# Patient Record
Sex: Female | Born: 1965 | Race: White | Hispanic: No | Smoking: Never smoker
Health system: Southern US, Community
[De-identification: ages and names within clinical notes are randomized; demographics above are authoritative.]

## PROBLEM LIST (undated history)

## (undated) DIAGNOSIS — R42 Dizziness and giddiness: Secondary | ICD-10-CM

## (undated) DIAGNOSIS — H9319 Tinnitus, unspecified ear: Secondary | ICD-10-CM

## (undated) DIAGNOSIS — Z923 Personal history of irradiation: Secondary | ICD-10-CM

## (undated) DIAGNOSIS — C50919 Malignant neoplasm of unspecified site of unspecified female breast: Secondary | ICD-10-CM

## (undated) DIAGNOSIS — F419 Anxiety disorder, unspecified: Secondary | ICD-10-CM

## (undated) DIAGNOSIS — Z9221 Personal history of antineoplastic chemotherapy: Secondary | ICD-10-CM

## (undated) DIAGNOSIS — R112 Nausea with vomiting, unspecified: Secondary | ICD-10-CM

## (undated) DIAGNOSIS — L309 Dermatitis, unspecified: Secondary | ICD-10-CM

## (undated) DIAGNOSIS — Z9889 Other specified postprocedural states: Secondary | ICD-10-CM

## (undated) DIAGNOSIS — J45909 Unspecified asthma, uncomplicated: Secondary | ICD-10-CM

## (undated) DIAGNOSIS — Z87898 Personal history of other specified conditions: Secondary | ICD-10-CM

## (undated) DIAGNOSIS — C50411 Malignant neoplasm of upper-outer quadrant of right female breast: Secondary | ICD-10-CM

## (undated) HISTORY — DX: Tinnitus, unspecified ear: H93.19

## (undated) HISTORY — DX: Anxiety disorder, unspecified: F41.9

## (undated) HISTORY — DX: Dizziness and giddiness: R42

## (undated) HISTORY — DX: Unspecified asthma, uncomplicated: J45.909

## (undated) HISTORY — DX: Malignant neoplasm of upper-outer quadrant of right female breast: C50.411

---
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
## 1998-05-28 ENCOUNTER — Other Ambulatory Visit: Admission: RE | Admit: 1998-05-28 | Discharge: 1998-05-28 | Payer: Self-pay | Admitting: Obstetrics & Gynecology

## 1998-06-06 ENCOUNTER — Encounter: Payer: Self-pay | Admitting: Obstetrics & Gynecology

## 1998-06-06 ENCOUNTER — Ambulatory Visit (HOSPITAL_COMMUNITY): Admission: RE | Admit: 1998-06-06 | Discharge: 1998-06-06 | Payer: Self-pay | Admitting: Obstetrics & Gynecology

## 1999-03-09 ENCOUNTER — Inpatient Hospital Stay (HOSPITAL_COMMUNITY): Admission: AD | Admit: 1999-03-09 | Discharge: 1999-03-11 | Payer: Self-pay | Admitting: Obstetrics & Gynecology

## 1999-03-12 ENCOUNTER — Encounter: Admission: RE | Admit: 1999-03-12 | Discharge: 1999-06-10 | Payer: Self-pay | Admitting: Obstetrics & Gynecology

## 1999-04-28 ENCOUNTER — Encounter: Admission: RE | Admit: 1999-04-28 | Discharge: 1999-04-28 | Payer: Self-pay | Admitting: Sports Medicine

## 1999-07-22 ENCOUNTER — Other Ambulatory Visit: Admission: RE | Admit: 1999-07-22 | Discharge: 1999-07-22 | Payer: Self-pay | Admitting: Obstetrics & Gynecology

## 2000-04-22 ENCOUNTER — Encounter: Admission: RE | Admit: 2000-04-22 | Discharge: 2000-04-22 | Payer: Self-pay | Admitting: Sports Medicine

## 2000-05-03 ENCOUNTER — Encounter: Admission: RE | Admit: 2000-05-03 | Discharge: 2000-05-03 | Payer: Self-pay | Admitting: Sports Medicine

## 2000-05-03 ENCOUNTER — Encounter: Payer: Self-pay | Admitting: Sports Medicine

## 2000-07-21 ENCOUNTER — Other Ambulatory Visit: Admission: RE | Admit: 2000-07-21 | Discharge: 2000-07-21 | Payer: Self-pay | Admitting: Obstetrics & Gynecology

## 2001-06-01 ENCOUNTER — Other Ambulatory Visit: Admission: RE | Admit: 2001-06-01 | Discharge: 2001-06-01 | Payer: Self-pay | Admitting: Obstetrics & Gynecology

## 2002-09-05 ENCOUNTER — Encounter: Admission: RE | Admit: 2002-09-05 | Discharge: 2002-09-05 | Payer: Self-pay | Admitting: Obstetrics and Gynecology

## 2002-11-02 ENCOUNTER — Inpatient Hospital Stay (HOSPITAL_COMMUNITY): Admission: AD | Admit: 2002-11-02 | Discharge: 2002-11-04 | Payer: Self-pay | Admitting: Obstetrics & Gynecology

## 2002-11-02 ENCOUNTER — Encounter: Admission: RE | Admit: 2002-11-02 | Discharge: 2002-11-02 | Payer: Self-pay | Admitting: Sports Medicine

## 2002-12-05 ENCOUNTER — Other Ambulatory Visit: Admission: RE | Admit: 2002-12-05 | Discharge: 2002-12-05 | Payer: Self-pay | Admitting: Obstetrics & Gynecology

## 2004-09-08 ENCOUNTER — Other Ambulatory Visit: Admission: RE | Admit: 2004-09-08 | Discharge: 2004-09-08 | Payer: Self-pay | Admitting: Obstetrics & Gynecology

## 2006-05-27 DIAGNOSIS — J45909 Unspecified asthma, uncomplicated: Secondary | ICD-10-CM | POA: Insufficient documentation

## 2006-05-27 DIAGNOSIS — L2089 Other atopic dermatitis: Secondary | ICD-10-CM | POA: Insufficient documentation

## 2008-07-26 ENCOUNTER — Ambulatory Visit: Payer: Self-pay | Admitting: Sports Medicine

## 2008-07-26 DIAGNOSIS — M25559 Pain in unspecified hip: Secondary | ICD-10-CM | POA: Insufficient documentation

## 2009-10-28 ENCOUNTER — Ambulatory Visit: Payer: Self-pay | Admitting: Surgery

## 2009-10-28 ENCOUNTER — Emergency Department (HOSPITAL_COMMUNITY): Admission: EM | Admit: 2009-10-28 | Discharge: 2009-10-28 | Payer: Self-pay | Admitting: Emergency Medicine

## 2009-10-28 ENCOUNTER — Encounter (INDEPENDENT_AMBULATORY_CARE_PROVIDER_SITE_OTHER): Payer: Self-pay | Admitting: Emergency Medicine

## 2015-02-11 ENCOUNTER — Telehealth: Payer: Self-pay | Admitting: Pediatrics

## 2015-02-11 MED ORDER — MONTELUKAST SODIUM 10 MG PO TABS: 10.0000 mg | ORAL_TABLET | Freq: Every day | ORAL | Status: DC

## 2015-02-11 NOTE — Telephone Encounter (Signed)
Sent in 30 day supply on [facility] to [facility] [address] and called and notify patient of RX sent in. Patient will follow up with Dr Shaune Leeks on 02/25/15.

## 2015-02-11 NOTE — Telephone Encounter (Signed)
Last OV was 01/22/2014. Pt made apt with Dr. Shaune Leeks for 02/25/2015. Wanting singulair Refill at [facility] [address] advise

## 2015-02-25 ENCOUNTER — Ambulatory Visit (INDEPENDENT_AMBULATORY_CARE_PROVIDER_SITE_OTHER): Payer: Commercial Managed Care - PPO | Admitting: Pediatrics

## 2015-02-25 ENCOUNTER — Encounter: Payer: Self-pay | Admitting: Pediatrics

## 2015-02-25 VITALS — BP 132/80 | HR 80 | Resp 16

## 2015-02-25 DIAGNOSIS — J3089 Other allergic rhinitis: Secondary | ICD-10-CM | POA: Insufficient documentation

## 2015-02-25 DIAGNOSIS — J4540 Moderate persistent asthma, uncomplicated: Secondary | ICD-10-CM | POA: Diagnosis not present

## 2015-02-25 DIAGNOSIS — J301 Allergic rhinitis due to pollen: Secondary | ICD-10-CM

## 2015-02-25 NOTE — Progress Notes (Signed)
  [address] [place] [address] Dept: [phone]  FOLLOW UP NOTE  Patient ID: Courtney Gomez, female    DOB: 1965/08/27  Age: 49 y.o. MRN: [id number] Date of Office Visit: 02/25/2015  Assessment Chief Complaint: Asthma  HPI LEORIA PEGLOW presents for follow-up of her asthma and allergic rhinitis. She has had a flu vaccination. Her asthma is well controlled and she rarely needs Pro-air   Current medications are Singulair 10 mg once a day, cetirizine 10 mg once a day, Pro-air 2 puffs every 4 hours if needed, Advair Diskus 250/50 one puff once a day and fluticasone 2 sprays per nostril once a day if needed. Her other medications are outlined in the chart   Drug Allergies:  Allergies  Allergen Reactions  . Latex Itching and Swelling    Physical Exam: BP 132/80 mmHg  Pulse 80  Resp 16   Physical Exam  Constitutional: She is oriented to person, place, and time. She appears well-developed and well-nourished.  HENT:  Eyes normal. Ears normal. Nose normal. Pharynx normal.  Neck: Neck supple. No thyromegaly present.  Cardiovascular:  S1 and S2 normal no murmurs  Pulmonary/Chest:  Clear to percussion and auscultation  Lymphadenopathy:    She has no cervical adenopathy.  Neurological: She is alert and oriented to person, place, and time.  Psychiatric: She has a normal mood and affect. Her behavior is normal. Judgment and thought content normal.  Vitals reviewed.   Diagnostics:  FVC 3.43 L FEV1 2.42 L. Predicted FVC 3.40 L predicted FEV1 2.78 L-This  shows a minimal reduction in the FEV1 percent  Assessment and Plan: 1. Moderate persistent asthma, uncomplicated   2. Allergic rhinitis due to pollen         Patient Instructions  Continue on the treatment plan outlined above Follow-up in one year but she will call me she's not doing well on this treatment plan    Return in about 1 year (around 02/25/2016).    Thank you for the opportunity to care for  this patient.  Please do not hesitate to contact me with questions.  Penne Lash, M.D.  [facility] [address] [place], [place] [address] [phone]

## 2015-02-25 NOTE — Patient Instructions (Signed)
Continue on the treatment plan outlined above Follow-up in one year but she will call me she's not doing well on this treatment plan

## 2015-03-14 ENCOUNTER — Other Ambulatory Visit: Payer: Self-pay | Admitting: Pediatrics

## 2015-03-15 ENCOUNTER — Other Ambulatory Visit: Payer: Self-pay | Admitting: Pediatrics

## 2015-07-05 ENCOUNTER — Other Ambulatory Visit: Payer: Self-pay | Admitting: Obstetrics & Gynecology

## 2015-07-05 DIAGNOSIS — N6001 Solitary cyst of right breast: Secondary | ICD-10-CM

## 2015-07-11 ENCOUNTER — Other Ambulatory Visit: Payer: Self-pay | Admitting: Obstetrics & Gynecology

## 2015-07-11 ENCOUNTER — Ambulatory Visit
Admission: RE | Admit: 2015-07-11 | Discharge: 2015-07-11 | Disposition: A | Discharge status: Home / Self Care | Payer: 59 | Source: Ambulatory Visit | Attending: Obstetrics & Gynecology | Admitting: Obstetrics & Gynecology

## 2015-07-11 DIAGNOSIS — N6310 Unspecified lump in the right breast, unspecified quadrant: Secondary | ICD-10-CM

## 2015-07-11 DIAGNOSIS — N6001 Solitary cyst of right breast: Secondary | ICD-10-CM

## 2015-07-11 HISTORY — PX: BREAST BIOPSY: SHX20

## 2015-07-15 ENCOUNTER — Telehealth: Payer: Self-pay | Admitting: *Deleted

## 2015-07-15 ENCOUNTER — Encounter: Payer: Self-pay | Admitting: *Deleted

## 2015-07-15 DIAGNOSIS — C50411 Malignant neoplasm of upper-outer quadrant of right female breast: Secondary | ICD-10-CM

## 2015-07-15 HISTORY — DX: Malignant neoplasm of upper-outer quadrant of right female breast: C50.411

## 2015-07-15 NOTE — Telephone Encounter (Signed)
Confirmed [facility] for 07/24/15 at 0815.  Instructions and contact information given.

## 2015-07-18 ENCOUNTER — Telehealth: Payer: Self-pay | Admitting: *Deleted

## 2015-07-18 NOTE — Telephone Encounter (Signed)
Pt called questioning site of bx and increased size. Informed pt that often a hematoma will form after a bx. Pt relate she spoke to Dr. Theda Sers and was told the same. No further questions.

## 2015-07-24 ENCOUNTER — Ambulatory Visit
Admission: RE | Admit: 2015-07-24 | Discharge: 2015-07-24 | Disposition: A | Discharge status: Home / Self Care | Payer: 59 | Source: Ambulatory Visit | Attending: Radiation Oncology | Admitting: Radiation Oncology

## 2015-07-24 ENCOUNTER — Other Ambulatory Visit: Payer: Self-pay

## 2015-07-24 ENCOUNTER — Ambulatory Visit: Payer: 59 | Attending: General Surgery | Admitting: Physical Therapy

## 2015-07-24 ENCOUNTER — Encounter: Payer: Self-pay | Admitting: *Deleted

## 2015-07-24 ENCOUNTER — Encounter: Payer: Self-pay | Admitting: Hematology and Oncology

## 2015-07-24 ENCOUNTER — Telehealth: Payer: Self-pay | Admitting: Hematology and Oncology

## 2015-07-24 ENCOUNTER — Encounter: Payer: Self-pay | Admitting: Skilled Nursing Facility1

## 2015-07-24 ENCOUNTER — Other Ambulatory Visit (HOSPITAL_BASED_OUTPATIENT_CLINIC_OR_DEPARTMENT_OTHER): Payer: 59

## 2015-07-24 ENCOUNTER — Ambulatory Visit (HOSPITAL_BASED_OUTPATIENT_CLINIC_OR_DEPARTMENT_OTHER): Payer: 59 | Admitting: Hematology and Oncology

## 2015-07-24 ENCOUNTER — Encounter: Payer: Self-pay | Admitting: Physical Therapy

## 2015-07-24 VITALS — BP 169/88 | HR 99 | Temp 98.1°F | Resp 18 | Ht 66.0 in | Wt 135.0 lb

## 2015-07-24 DIAGNOSIS — C50411 Malignant neoplasm of upper-outer quadrant of right female breast: Secondary | ICD-10-CM

## 2015-07-24 DIAGNOSIS — R293 Abnormal posture: Secondary | ICD-10-CM | POA: Diagnosis present

## 2015-07-24 LAB — CBC WITH DIFFERENTIAL/PLATELET
BASO%: 0.2 % (ref 0.0–2.0)
Basophils Absolute: 0 10*3/uL (ref 0.0–0.1)
EOS%: 2.1 % (ref 0.0–7.0)
Eosinophils Absolute: 0.1 10*3/uL (ref 0.0–0.5)
HCT: 45.8 % (ref 34.8–46.6)
HGB: 14.8 g/dL (ref 11.6–15.9)
LYMPH#: 1.9 10*3/uL (ref 0.9–3.3)
LYMPH%: 29.1 % (ref 14.0–49.7)
MCH: 32.1 pg (ref 25.1–34.0)
MCHC: 32.3 g/dL (ref 31.5–36.0)
MCV: 99.3 fL (ref 79.5–101.0)
MONO#: 0.3 10*3/uL (ref 0.1–0.9)
MONO%: 4.7 % (ref 0.0–14.0)
NEUT%: 63.9 % (ref 38.4–76.8)
NEUTROS ABS: 4.2 10*3/uL (ref 1.5–6.5)
PLATELETS: 230 10*3/uL (ref 145–400)
RBC: 4.61 10*6/uL (ref 3.70–5.45)
RDW: 12.7 % (ref 11.2–14.5)
WBC: 6.5 10*3/uL (ref 3.9–10.3)

## 2015-07-24 LAB — COMPREHENSIVE METABOLIC PANEL
ALT: 25 U/L (ref 0–55)
ANION GAP: 10 meq/L (ref 3–11)
AST: 22 U/L (ref 5–34)
Albumin: 4 g/dL (ref 3.5–5.0)
Alkaline Phosphatase: 70 U/L (ref 40–150)
BILIRUBIN TOTAL: 0.59 mg/dL (ref 0.20–1.20)
BUN: 7.8 mg/dL (ref 7.0–26.0)
CO2: 27 meq/L (ref 22–29)
CREATININE: 0.9 mg/dL (ref 0.6–1.1)
Calcium: 9.9 mg/dL (ref 8.4–10.4)
Chloride: 106 mEq/L (ref 98–109)
EGFR: 79 mL/min/{1.73_m2} — ABNORMAL LOW (ref >90)
GLUCOSE: 97 mg/dL (ref 70–140)
Potassium: 3.8 mEq/L (ref 3.5–5.1)
SODIUM: 143 meq/L (ref 136–145)
TOTAL PROTEIN: 7.5 g/dL (ref 6.4–8.3)

## 2015-07-24 MED ORDER — ONDANSETRON HCL 8 MG PO TABS: 8.0000 mg | ORAL_TABLET | Freq: Two times a day (BID) | ORAL | Status: DC | PRN

## 2015-07-24 MED ORDER — LIDOCAINE-PRILOCAINE 2.5-2.5 % EX CREA: TOPICAL_CREAM | CUTANEOUS | Status: DC

## 2015-07-24 MED ORDER — PROCHLORPERAZINE MALEATE 10 MG PO TABS: 10.0000 mg | ORAL_TABLET | Freq: Four times a day (QID) | ORAL | Status: DC | PRN

## 2015-07-24 MED ORDER — LORAZEPAM 0.5 MG PO TABS: 0.5000 mg | ORAL_TABLET | Freq: Every day | ORAL | Status: DC

## 2015-07-24 MED ORDER — DEXAMETHASONE 4 MG PO TABS: 4.0000 mg | ORAL_TABLET | Freq: Two times a day (BID) | ORAL | Status: DC

## 2015-07-24 NOTE — Assessment & Plan Note (Signed)
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 After reviewing the pathology in detail, we proceeded to discuss the different treatment options between surgery, radiation, chemotherapy, antiestrogen therapies.  Recommendation based on multidisciplinary tumor board: 1. Neoadjuvant chemotherapy with Adriamycin and Cytoxan dose dense 4 followed by Abraxane weekly 12 2. Followed by breast conserving surgery with sentinel lymph node study 3. Followed by adjuvant radiation therapy  Chemotherapy Counseling: I discussed the risks and benefits of chemotherapy including the risks of nausea/ vomiting, risk of infection from low WBC count, fatigue due to chemo or anemia, bruising or bleeding due to low platelets, mouth sores, loss/ change in taste and decreased appetite. Liver and kidney function will be monitored through out chemotherapy as abnormalities in liver and kidney function may be a side effect of treatment. Cardiac dysfunction due to Adriamycin was discussed in detail. Risk of permanent bone marrow dysfunction due to chemo were also discussed.  Plan: 1. Port placement to be done next Monday 2. Echocardiogram 3. Chemotherapy class 4. Breast MRI  Genetic counseling will also be arranged  PREVENT trial: [id number] [phone]  Newly diagnosed breast cancer Stages 1-3 scheduled to receive anthracycline randomized to atorvastatin 40 mg or placebo once daily for 24 months along with 3 cardiac MRIs or 2 years paid for by the trial. I  discussed the risks and benefits of atorvastatin including the risk of myopathy and elevation of liver function tests. I explained the randomization process and patient is interested in the trial. I provided her with literature to read and provided her with the contact with the clinical trials nurse to ask any questions regarding the trial.  Return to clinic in 1 week to start chemotherapy.

## 2015-07-24 NOTE — Progress Notes (Signed)
Subjective:     Patient ID: Courtney Gomez, female   DOB: 1965-11-24, 50 y.o.   MRN: [id number]  HPI   Review of Systems     Objective:   Physical Exam For the patient to understand and be given the tools to implement a healthy plant based diet during their cancer diagnosis.     Assessment:     Patient was seen today and found to be high energy and accompanied by her friend. Pts ht unknown, wt 135 pounds. Pt states she does not have an appetite and can graze on a bag of fruit all day and be fine.      Plan:     Dietitian educated the patient on implementing a plant based diet by incorporating more plant proteins, fruits, and vegetables. As a part of a healthy routine physical activity was discussed. Dietitian educated the pt on the importance of eating enough balanced calories to sustain health. The importance of legitimate, evidence based information was discussed and examples were given. A folder of evidence based information with a focus on a plant based diet and general nutrition during cancer was given to the patient.  As a part of the continuum of care the cancer dietitian's contact information was given to the patient in the event they would like to have a follow up appointment.

## 2015-07-24 NOTE — Progress Notes (Signed)
[facility] CONSULT NOTE  Patient Care Team: Vania Rea, MD as PCP - General (Obstetrics and Gynecology)  CHIEF COMPLAINTS/PURPOSE OF CONSULTATION:  Newly diagnosed breast cancer  HISTORY OF PRESENTING ILLNESS:  Courtney Gomez 50 y.o. female is here because of recent diagnosis of right breast cancer. Patient had a routine screening mammogram last year which was normal. She felt an itching sensation underneath her right axilla and she felt around and found a lump. She then immediately brought her to the attention of her primary care physician who then obtained additional imaging studies with mammograms and ultrasound. 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 She was presented this morning of the multidisciplinary tumor board and she is here today at the [facility] clinic to discuss treatment plan.  I reviewed her records extensively and collaborated the history with the patient.  SUMMARY OF ONCOLOGIC HISTORY:   Breast cancer of upper-outer quadrant of right female breast ([place])   07/11/2015 Mammogram Right breast mass in the axillary tail 1.7 x 1.5 x 1.3 cm axillary ultrasound negative, T1c N0 stage IA clinical stage   07/11/2015 Initial Diagnosis Right breast biopsy 10:00 position: Invasive ductal carcinoma grade 3, ER 0%, P of 0%, Ki-67 95%, HER-2 negative ratio 1.21    In terms of breast cancer risk profile:  She menarched at early age of 7  She had 2 pregnancy, her first child was born at age 61 and [age]  She has not received birth control pills.  She was never exposed to fertility medications or hormone replacement therapy.  She has no family history of Breast/GYN/GI cancer  MEDICAL HISTORY:  Past Medical History  Diagnosis Date  . Asthma   . Breast cancer of upper-outer quadrant of right female breast ([place]) 07/15/2015  . Anxiety     SURGICAL HISTORY: History reviewed. No pertinent  past surgical history.  SOCIAL HISTORY: Social History   Social History  . Marital Status: Married    Spouse Name: N/A  . Number of Children: N/A  . Years of Education: N/A   Occupational History  . Not on file.   Social History Main Topics  . Smoking status: Never Smoker   . Smokeless tobacco: Never Used  . Alcohol Use: 0.0 oz/week    0 Standard drinks or equivalent per week     Comment: 2-3  . Drug Use: No  . Sexual Activity: Not on file   Other Topics Concern  . Not on file   Social History Narrative    FAMILY HISTORY: History reviewed. No pertinent family history.  ALLERGIES:  is allergic to latex.  MEDICATIONS:  Current Outpatient Prescriptions  Medication Sig Dispense Refill  . ADVAIR DISKUS 250-50 MCG/DOSE AEPB Inhale 1 puff into the lungs daily.   5  . albuterol (PROAIR HFA) 108 (90 BASE) MCG/ACT inhaler Inhale 2 puffs into the lungs every 6 (six) hours as needed for wheezing or shortness of breath.    . cetirizine (ZYRTEC) 10 MG tablet Take 10 mg by mouth daily.    . fluticasone (FLONASE) 50 MCG/ACT nasal spray Place 1 spray into both nostrils daily.    . montelukast (SINGULAIR) 10 MG tablet TAKE 1 TABLET (10 MG TOTAL) BY MOUTH AT BEDTIME. 30 tablet 3   No current facility-administered medications for this visit.    REVIEW OF SYSTEMS:   Constitutional: Denies fevers, chills or abnormal night sweats Eyes: Denies blurriness of vision,  double vision or watery eyes Ears, nose, mouth, throat, and face: Denies mucositis or sore throat Respiratory: Denies cough, dyspnea or wheezes Cardiovascular: Denies palpitation, chest discomfort or lower extremity swelling Gastrointestinal:  Denies nausea, heartburn or change in bowel habits Skin: Denies abnormal skin rashes Lymphatics: Denies new lymphadenopathy or easy bruising Neurological:Denies numbness, tingling or new weaknesses Behavioral/Psych: Mood is stable, no new changes, Generally very anxious person   Breast: Palpable lump in the right axilla All other systems were reviewed with the patient and are negative.  PHYSICAL EXAMINATION: ECOG PERFORMANCE STATUS: 0 - Asymptomatic  Filed Vitals:   07/24/15 0821  BP: 169/88  Pulse: 99  Temp: 98.1 F (36.7 C)  Resp: 18   Filed Weights   07/24/15 0821  Weight: 135 lb (61.236 kg)    GENERAL:alert, no distress and comfortable SKIN: skin color, texture, turgor are normal, no rashes or significant lesions EYES: normal, conjunctiva are pink and non-injected, sclera clear OROPHARYNX:no exudate, no erythema and lips, buccal mucosa, and tongue normal  NECK: supple, thyroid normal size, non-tender, without nodularity LYMPH:  no palpable lymphadenopathy in the cervical, axillary or inguinal LUNGS: clear to auscultation and percussion with normal breathing effort HEART: regular rate & rhythm and no murmurs and no lower extremity edema ABDOMEN:abdomen soft, non-tender and normal bowel sounds Musculoskeletal:no cyanosis of digits and no clubbing  PSYCH: alert & oriented x 3 with fluent speech NEURO: no focal motor/sensory deficits BREAST: Palpable lump in the right breast axillary tail. No palpable axillary or supraclavicular lymphadenopathy (exam performed in the presence of a chaperone)   LABORATORY DATA:  I have reviewed the data as listed Lab Results  Component Value Date   WBC 6.5 07/24/2015   HGB 14.8 07/24/2015   HCT 45.8 07/24/2015   MCV 99.3 07/24/2015   PLT 230 07/24/2015   Lab Results  Component Value Date   NA 143 07/24/2015   K 3.8 07/24/2015   CO2 27 07/24/2015    RADIOGRAPHIC STUDIES: I have personally reviewed the radiological reports and agreed with the findings in the report.  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 After reviewing the pathology in detail, we proceeded to discuss the different treatment options between surgery, radiation, chemotherapy, antiestrogen therapies.  Recommendation based on multidisciplinary tumor board: 1. Neoadjuvant chemotherapy with Adriamycin and Cytoxan dose dense 4 followed by Abraxane weekly 12 2. Followed by breast conserving surgery with sentinel lymph node study 3. Followed by adjuvant radiation therapy  Chemotherapy Counseling: I discussed the risks and benefits of chemotherapy including the risks of nausea/ vomiting, risk of infection from low WBC count, fatigue due to chemo or anemia, bruising or bleeding due to low platelets, mouth sores, loss/ change in taste and decreased appetite. Liver and kidney function will be monitored through out chemotherapy as abnormalities in liver and kidney function may be a side effect of treatment. Cardiac dysfunction due to Adriamycin was discussed in detail. Risk of permanent bone marrow dysfunction due to  chemo were also discussed.  Plan: 1. Port placement to be done next Monday 2. Echocardiogram 3. Chemotherapy class 4. Breast MRI  Genetic counseling will also be arranged  PREVENT trial: [id number] [phone]  Newly diagnosed breast cancer Stages 1-3 scheduled to receive anthracycline randomized to atorvastatin 40 mg or placebo once daily for 24 months along with 3 cardiac MRIs or 2 years paid for by the trial. I discussed the risks and benefits of atorvastatin including the risk of myopathy and elevation of liver function tests. I explained the randomization  process and patient is interested in the trial. I provided her with literature to read and provided her with the contact with the clinical trials nurse to ask any questions regarding the trial.  Return to clinic in 1 week to start chemotherapy.   All questions were answered. The patient knows to call the clinic with any problems, questions or concerns.    Rulon Eisenmenger, MD 07/24/2015

## 2015-07-24 NOTE — Progress Notes (Signed)
[facility] Psychosocial Distress Screening Clinical Social Work  Clinical Social Work met with pt and her mother at [facility] to introduce self, explain role of CSW/Pt and Family Support team and to review distress screening protocol.  The patient scored a 7 on the Psychosocial Distress Thermometer which indicates severe distress. Clinical Social Worker met with pt at length to assess for distress and other psychosocial needs. Pt shared her biggest concern/stressor is her children ages 15yo; Ben and 16yo, Valmeyer. She reports Maylon Cos suffers from anger issues and depression. He is currently in counseling with LCSW, but she is concerned how they both will react. CSW provided pt with information on [employer] for further assistance and support. CSW also discussed common reactions by teens and possible coping ideas/supports. Pt plans to utilize son's current LCSW as a resource as well.   Pt high energy today and very talkative. Pt feels very well supported through [employer], church, friends and family. 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 Pt was provided info on [employer] and plans to consider attending support groups and other programs. Pt aware to reach out to CSW as needed.   ONCBCN DISTRESS SCREENING 07/24/2015  Screening Type Initial Screening  Distress experienced in past week (1-10) 7  Practical problem type Childcare  Family Problem type Children  Referral to support programs Yes  Other kids path and son's counselor to assist with discussion of pt's cancer diagnosis    Clinical Social Worker follow up needed: No.  If yes, follow up plan:  Loren Racer, [place]  [facility] Phone: [phone] Fax: [phone]

## 2015-07-24 NOTE — Therapy (Signed)
[place], [place], [address] Phone: [phone]   Fax:  [phone]  Physical Therapy Evaluation  Patient Details  Name: Courtney Gomez MRN: [id number] Date of Birth: 26-Oct-1965 Referring Provider: Dr. Rolm Bookbinder  Encounter Date: 07/24/2015      PT End of Session - 07/24/15 1515    Visit Number 1   Number of Visits 1   PT Start Time 9021   PT Stop Time 1155  Also saw pt from 1205-1230 for a total of 35 minutes   PT Time Calculation (min) 10 min   Activity Tolerance Patient tolerated treatment well   Behavior During Therapy [facility] for tasks assessed/performed      Past Medical History  Diagnosis Date  . Asthma   . Breast cancer of upper-outer quadrant of right female breast ([place]) 07/15/2015  . Anxiety     History reviewed. No pertinent past surgical history.  There were no vitals filed for this visit.       Subjective Assessment - 07/24/15 1516    Subjective Patient reports she is here today to meet with her medical team for her newly diagnosed right breast cancer.   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 It measures 1.7 cm in the upper outer quadrant with a Ki67 of 95%.  Her medical team met to determine a recommended treatment plan.   Patient Stated Goals Reduce lymphedema risk and learn post op shoulder ROM HEP            [facility] PT Assessment - 07/24/15 0001    Assessment   Medical Diagnosis Right breast cancer   Referring Provider Dr. Rolm Bookbinder   Onset Date/Surgical Date 07/11/15   Hand Dominance Right   Prior Therapy none   Precautions   Precautions Other (comment)   Precaution Comments Active breast cancer   Restrictions   Weight Bearing Restrictions No   Balance Screen   Has the patient fallen in the past 6 months No   Has the  patient had a decrease in activity level because of a fear of falling?  No   Is the patient reluctant to leave their home because of a fear of falling?  No   Home Ecologist residence   Living Arrangements Children  Lives with 51 and 10 y.o. 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; on computer 8 hours per day   Leisure She walks 2-3x/week for > 1 hour   Cognition   Overall Cognitive Status Within Functional Limits for tasks assessed   Posture/Postural Control   Posture/Postural Control Postural limitations   Postural Limitations Rounded Shoulders   ROM / Strength   AROM / PROM / Strength AROM;Strength   AROM   AROM Assessment Site Shoulder;Cervical   Right/Left Shoulder Right;Left   Right Shoulder Extension 65 Degrees   Right Shoulder Flexion 152 Degrees   Right Shoulder ABduction 160 Degrees   Right Shoulder Internal Rotation 75 Degrees   Right Shoulder External Rotation 84 Degrees   Left Shoulder Extension 65 Degrees   Left Shoulder Flexion 149 Degrees   Left Shoulder ABduction 155 Degrees   Left Shoulder Internal Rotation 70 Degrees   Left Shoulder  External Rotation 88 Degrees   Cervical Flexion WNL   Cervical Extension WNL   Cervical - Right Side Bend WNL   Cervical - Left Side Bend WNL   Cervical - Right Rotation WNL   Cervical - Left Rotation WNL   Strength   Overall Strength Within functional limits for tasks performed           LYMPHEDEMA/ONCOLOGY QUESTIONNAIRE - 07/24/15 1507    Type   Cancer Type Right breast cancer   Lymphedema Assessments   Lymphedema Assessments Upper extremities   Right Upper Extremity Lymphedema   10 cm Proximal to Olecranon Process 25.2 cm   Olecranon Process 23.5 cm   10 cm Proximal to Ulnar Styloid Process 19.9 cm   Just Proximal to  Ulnar Styloid Process 14.2 cm   Across Hand at [employer] 17.1 cm   At [place] of 2nd Digit 5.8 cm   Left Upper Extremity Lymphedema   10 cm Proximal to Olecranon Process 25.7 cm   Olecranon Process 23.2 cm   10 cm Proximal to Ulnar Styloid Process 19.6 cm   Just Proximal to Ulnar Styloid Process 14.3 cm   Across Hand at [employer] 17.8 cm   At [place] of 2nd Digit 5.6 cm      Patient was instructed today in a home exercise program today for post op shoulder range of motion. These included active assist shoulder flexion in sitting, scapular retraction, wall walking with shoulder abduction, and hands behind head external rotation.  She was encouraged to do these twice a day, holding 3 seconds and repeating 5 times when permitted by her physician.         PT Education - 07/24/15 1514    Education provided Yes   Education Details Lymphedema risk reduction and post op shoulder ROM HEP   Person(s) Educated Patient;Parent(s)   Methods Explanation;Demonstration;Handout   Comprehension Returned demonstration;Verbalized understanding              Breast Clinic Goals - 07/24/15 1519    Patient will be able to verbalize understanding of pertinent lymphedema risk reduction practices relevant to her diagnosis specifically related to skin care.   Time 1   Period Days   Status Achieved   Patient will be able to return demonstrate and/or verbalize understanding of the post-op home exercise program related to regaining shoulder range of motion.   Time 1   Period Days   Status Achieved   Patient will be able to verbalize understanding of the importance of attending the postoperative After Breast Cancer Class for further lymphedema risk reduction education and therapeutic exercise.   Time 1   Period Days   Status Achieved              Plan - 07/24/15 1516    Clinical Impression Statement Patient was diagnosed 07/11/15 with right triple negative grade 3 invasive ductal  carcinoma breast cancer. It measures 1.7 cm in the upper outer quadrant with a Ki67 of 95%.  Her medical team met to determine a recommended treatment plan.  She is planning to have neoadjuvant chemotherapy followed by a right lumpectomy and sentinel node biopsy and radiation.  She may benefit from post op PT to regain shoulder ROM and reduce lymphedema risk.   Rehab Potential Excellent   Clinical Impairments Affecting Rehab Potential None   PT Frequency One time visit   PT Treatment/Interventions Therapeutic exercise;Patient/family education   PT Next Visit Plan Will f/u after surgery  PT Home Exercise Plan Post op shoulder ROM HEP   Consulted and Agree with Plan of Care Patient;Family member/caregiver   Family Member Consulted Mother      Patient will benefit from skilled therapeutic intervention in order to improve the following deficits and impairments:  Decreased strength, Decreased knowledge of precautions, Pain, Impaired UE functional use, Decreased range of motion  Visit Diagnosis: Carcinoma of upper-outer quadrant of right female breast ([place]) - Plan: PT plan of care cert/re-cert  Abnormal posture - Plan: PT plan of care cert/re-cert   Patient will follow up at outpatient cancer rehab if needed following surgery.  If the patient requires physical therapy at that time, a specific plan will be dictated and sent to the referring physician for approval. The patient was educated today on appropriate basic range of motion exercises to begin post operatively and the importance of attending the After Breast Cancer class following surgery.  Patient was educated today on lymphedema risk reduction practices as it pertains to recommendations that will benefit the patient immediately following surgery.  She verbalized good understanding.  No additional physical therapy is indicated at this time.     Problem List Patient Active Problem List   Diagnosis Date Noted  . Breast cancer of upper-outer  quadrant of right female breast ([place]) 07/15/2015  . Moderate persistent asthma 02/25/2015  . Allergic rhinitis due to pollen 02/25/2015  . HIP PAIN, BILATERAL 07/26/2008  . ASTHMA, UNSPECIFIED 05/27/2006  . ECZEMA, ATOPIC DERMATITIS 05/27/2006   Annia Friendly, PT 07/24/2015 3:22 PM  [place], [place], [address] Phone: [phone]   Fax:  [phone]  Name: LURLIE WIGEN MRN: [id number] Date of Birth: 1965/08/28

## 2015-07-24 NOTE — Telephone Encounter (Signed)
appt made anda avs printed

## 2015-07-24 NOTE — Progress Notes (Signed)
Radiation Oncology         [id number]) [phone] ________________________________  Initial outpatient Consultation - Date: 07/24/2015   Name: Courtney Gomez MRN: [id number]   DOB: [birth year]  REFERRING PHYSICIAN: Rolm Bookbinder, MD  DIAGNOSIS AND STAGE: T1c N0 invasive ductal carcinoma of the right breast  HISTORY OF PRESENT ILLNESS::Courtney Gomez is a 50 y.o. female who presented with a two week history of a palpable right breast mass. On mammogram, she was found to have dense breasts and had a right breast mass on ultra sound which showed a 1.7 x 1.5 x 1.3 cm mass in the axillary tail. Axillary ultrasound was found to be negative. Core biopsy revealed grade 3 invasive ductal carcinoma triple negative with a Ki67 of 95 %. She has a family history of a paternal grandmother with colon cancer. She currently works at [employer].  She is accompanied by her mother.   Today, she expressed fear about telling her children about her diagnosis. We discussed potential resources to help her navigate this conversation.   PREVIOUS RADIATION THERAPY: No  Gynecologic History:  Age [age] first menstrual [age]? 13.5 Still having periods? No  Approximate date of last period? "on pill (until biopsy) for years)" Ever used hormone replacement? No  Obstetric History:  How many children have you carried to term? 2 Your age at first live birth: 14.5 Pregnant now or trying to get pregnant? No Ever used birth control pills or hormone shots for contraception? No  Health Maintenance:  Have you ever had a colonoscopy? Sent in sample to gyn Have you ever had a bone density? No Date of your last PAP smear? Sept/Oct 16 Date of your FIRST mammogram? "see syn records"  Past medical, social and family history were reviewed in the electronic chart. Review of symptoms was reviewed in the electronic chart. Medications were reviewed in the electronic chart.   PHYSICAL EXAM: BP: 169/88 mmHg, Pulse Rate: 99, Resp:  18, Temp: 98.1 F, Temp Source: Oral, SpO2: 100%, Weight, 135 lb, Height: N/A  This is a well appearing female in no acute distress. She is alert and oriented. No palpable abnormalities of the left breast. Bruising of the upper-outer quadrant of the right breast. No palpable supraclavicular or axillary adenopathy bilaterally.   IMPRESSION: [name] is a 50 y.o female with T1cN0 Invasive Ductal Carcinoma of the Right breast. .  PLAN: We discussed the role of radiation and decreasing local failures in patients who undergo lumpectomy. We discussed the retrospective data showing an increase in failure rates in patients who have a pathologic complete response and did not undergo radiation. For this reason I have recommended radiation to the whole breast followed by boost to the tumor bed. We discussed the process of simulation the placement tattoos. We discussed 4-6 weeks of treatment as an outpatient. We discussed possible side effects during treatment including but not limited to skin irritation darkness and fatigue. We discussed long-term effects of treatment which are extremely unlikely but possible including damage to the lungs and ribs. We discussed the low likelihood of secondary malignancies.   I spent 40 minutes face to face with the patient and more than 50% of that time was spent in counseling and/or coordination of care.   ------------------------------------------------  Courtney Silversmith, MD  This document serves as a record of services personally performed by Courtney Silversmith, MD. It was created on her behalf by Courtney Gomez, a trained medical scribe. The creation of this record is based on the  scribe's personal observations and the provider's statements to them. This document has been checked and approved by the attending provider.

## 2015-07-24 NOTE — Patient Instructions (Signed)

## 2015-07-25 ENCOUNTER — Other Ambulatory Visit: Payer: Self-pay | Admitting: *Deleted

## 2015-07-25 ENCOUNTER — Encounter: Payer: Self-pay | Admitting: *Deleted

## 2015-07-25 ENCOUNTER — Telehealth: Payer: Self-pay | Admitting: Hematology and Oncology

## 2015-07-25 ENCOUNTER — Other Ambulatory Visit: Payer: 59

## 2015-07-25 NOTE — Telephone Encounter (Signed)
Gave and printed appt sched and avs for pt...MD and PA both on pal on 6.2.Marland KitchenMarland KitchenMarland KitchenIris Pert aware and ok with no visit that day.

## 2015-07-26 ENCOUNTER — Telehealth: Payer: Self-pay | Admitting: *Deleted

## 2015-07-26 ENCOUNTER — Encounter (HOSPITAL_BASED_OUTPATIENT_CLINIC_OR_DEPARTMENT_OTHER): Payer: Self-pay | Admitting: *Deleted

## 2015-07-26 NOTE — Telephone Encounter (Signed)
Spoke with patient and gave her echo appointment at [facility] on 5/4 at [facility]

## 2015-07-29 ENCOUNTER — Telehealth: Payer: Self-pay | Admitting: *Deleted

## 2015-07-29 ENCOUNTER — Encounter: Payer: Self-pay | Admitting: Hematology and Oncology

## 2015-07-29 NOTE — Telephone Encounter (Signed)
Spoke with patient to follow up from [facility].  She is aware of all her appointments.  Encouraged her to call with any needs or concerns.

## 2015-07-29 NOTE — Telephone Encounter (Signed)
  Oncology Nurse Navigator Documentation    Navigator Encounter Type: Telephone (07/29/15 1300) Telephone: Incoming Call;Appt Confirmation/Clarification (07/29/15 1300)                                        Time Spent with Patient: 30 (07/29/15 1300)

## 2015-07-29 NOTE — Progress Notes (Signed)
Patient returned my phone call regarding FA introduction. Asked patient if she had any financial questions or concerns. Patient had several questions about if she wasn't going to meet deductible soon, how would she be able to receive treatment, and was there another treatment option besides Neulasta. Advised patient there are options such as the [employer] First Step program for commercially insured patient for Neulasta. Patient also wanted to know what bills she should focus on first prior to her meeting her OOP. Advised patient it depends on her treatment plan and what option she was given as far as amount for surgery if she was having surgery. Patient also asked about what other reasons would she need to call me. Advised patient for any financial questions or concerns. Also explained to patient the [employer] if she had any personal bills or transportation issues that she may need assistance with. Patient states as of right now she doesn't have any issues. Patient has my number for any additional financial questions or concerns.

## 2015-07-29 NOTE — Progress Notes (Signed)
Called patient and left a voicemail to introduce myself as financial advocate, see if she had any financial questions or concerns and if she would be interested in signing up for assistance with Neulasta through [place]. Left my return name and contact number.

## 2015-07-30 ENCOUNTER — Other Ambulatory Visit: Payer: Self-pay | Admitting: General Surgery

## 2015-07-30 ENCOUNTER — Ambulatory Visit
Admission: RE | Admit: 2015-07-30 | Discharge: 2015-07-30 | Disposition: A | Discharge status: Home / Self Care | Payer: 59 | Source: Ambulatory Visit | Attending: Hematology and Oncology | Admitting: Hematology and Oncology

## 2015-07-30 ENCOUNTER — Encounter (HOSPITAL_COMMUNITY): Payer: Self-pay | Admitting: *Deleted

## 2015-07-30 DIAGNOSIS — C50411 Malignant neoplasm of upper-outer quadrant of right female breast: Secondary | ICD-10-CM

## 2015-07-30 MED ORDER — LACTATED RINGERS IV SOLN: INTRAVENOUS | Status: DC

## 2015-07-30 MED ORDER — SCOPOLAMINE 1 MG/3DAYS TD PT72
1.0000 | MEDICATED_PATCH | Freq: Once | TRANSDERMAL | Status: DC | PRN
  Filled 2015-07-30: qty 1

## 2015-07-30 MED ORDER — FENTANYL CITRATE (PF) 100 MCG/2ML IJ SOLN
50.0000 ug | INTRAMUSCULAR | Status: DC | PRN
  Administered 2015-07-31: 100 ug via INTRAVENOUS

## 2015-07-30 MED ORDER — GLYCOPYRROLATE 0.2 MG/ML IJ SOLN: 0.2000 mg | Freq: Once | INTRAMUSCULAR | Status: DC | PRN

## 2015-07-30 MED ORDER — MIDAZOLAM HCL 2 MG/2ML IJ SOLN: 1.0000 mg | INTRAMUSCULAR | Status: DC | PRN

## 2015-07-30 MED ORDER — GADOBENATE DIMEGLUMINE 529 MG/ML IV SOLN
12.0000 mL | Freq: Once | INTRAVENOUS | Status: AC | PRN
  Administered 2015-07-30: 12 mL via INTRAVENOUS

## 2015-07-31 ENCOUNTER — Ambulatory Visit (HOSPITAL_COMMUNITY)
Admission: RE | Admit: 2015-07-31 | Discharge: 2015-07-31 | Disposition: A | Discharge status: Home / Self Care | Payer: 59 | Source: Ambulatory Visit | Attending: General Surgery | Admitting: General Surgery

## 2015-07-31 ENCOUNTER — Other Ambulatory Visit: Payer: Self-pay | Admitting: *Deleted

## 2015-07-31 ENCOUNTER — Ambulatory Visit (HOSPITAL_COMMUNITY): Payer: 59 | Admitting: Certified Registered Nurse Anesthetist

## 2015-07-31 ENCOUNTER — Encounter (HOSPITAL_COMMUNITY)
Admission: RE | Disposition: A | Discharge status: Home / Self Care | Payer: Self-pay | Source: Ambulatory Visit | Attending: General Surgery

## 2015-07-31 ENCOUNTER — Ambulatory Visit (HOSPITAL_COMMUNITY): Payer: 59

## 2015-07-31 DIAGNOSIS — Z95828 Presence of other vascular implants and grafts: Secondary | ICD-10-CM

## 2015-07-31 DIAGNOSIS — C50411 Malignant neoplasm of upper-outer quadrant of right female breast: Secondary | ICD-10-CM | POA: Diagnosis not present

## 2015-07-31 DIAGNOSIS — C50919 Malignant neoplasm of unspecified site of unspecified female breast: Secondary | ICD-10-CM | POA: Diagnosis present

## 2015-07-31 HISTORY — DX: Personal history of other specified conditions: Z87.898

## 2015-07-31 HISTORY — DX: Dermatitis, unspecified: L30.9

## 2015-07-31 HISTORY — PX: PORTACATH PLACEMENT: SHX2246

## 2015-07-31 LAB — HCG, SERUM, QUALITATIVE: Preg, Serum: NEGATIVE

## 2015-07-31 SURGERY — INSERTION, TUNNELED CENTRAL VENOUS DEVICE, WITH PORT
Anesthesia: General | Site: Chest | Laterality: Right

## 2015-07-31 MED ORDER — DEXAMETHASONE SODIUM PHOSPHATE 10 MG/ML IJ SOLN
INTRAMUSCULAR | Status: DC | PRN
  Administered 2015-07-31: 10 mg via INTRAVENOUS

## 2015-07-31 MED ORDER — BUPIVACAINE HCL (PF) 0.25 % IJ SOLN
INTRAMUSCULAR | Status: AC
  Filled 2015-07-31: qty 30

## 2015-07-31 MED ORDER — MIDAZOLAM HCL 2 MG/2ML IJ SOLN
INTRAMUSCULAR | Status: AC
  Filled 2015-07-31: qty 2

## 2015-07-31 MED ORDER — ONDANSETRON HCL 4 MG/2ML IJ SOLN
4.0000 mg | Freq: Once | INTRAMUSCULAR | Status: AC
  Administered 2015-07-31: 4 mg via INTRAVENOUS

## 2015-07-31 MED ORDER — HYDROMORPHONE HCL 1 MG/ML IJ SOLN
INTRAMUSCULAR | Status: AC
  Filled 2015-07-31: qty 1

## 2015-07-31 MED ORDER — SODIUM CHLORIDE 0.9 % IV SOLN
INTRAVENOUS | Status: DC | PRN
  Administered 2015-07-31: 500 mL

## 2015-07-31 MED ORDER — LIDOCAINE 2% (20 MG/ML) 5 ML SYRINGE
INTRAMUSCULAR | Status: AC
  Filled 2015-07-31: qty 5

## 2015-07-31 MED ORDER — PROPOFOL 10 MG/ML IV BOLUS
INTRAVENOUS | Status: DC | PRN
  Administered 2015-07-31: 200 mg via INTRAVENOUS

## 2015-07-31 MED ORDER — CEFAZOLIN SODIUM-DEXTROSE 2-4 GM/100ML-% IV SOLN
2.0000 g | INTRAVENOUS | Status: AC
  Administered 2015-07-31: 2 g via INTRAVENOUS

## 2015-07-31 MED ORDER — SCOPOLAMINE 1 MG/3DAYS TD PT72
1.0000 | MEDICATED_PATCH | TRANSDERMAL | Status: AC
  Administered 2015-07-31: 1 via TRANSDERMAL

## 2015-07-31 MED ORDER — HEPARIN SOD (PORK) LOCK FLUSH 100 UNIT/ML IV SOLN
INTRAVENOUS | Status: AC
  Filled 2015-07-31: qty 5

## 2015-07-31 MED ORDER — METOCLOPRAMIDE HCL 5 MG/ML IJ SOLN
10.0000 mg | Freq: Once | INTRAMUSCULAR | Status: AC
  Administered 2015-07-31: 10 mg via INTRAVENOUS

## 2015-07-31 MED ORDER — ONDANSETRON HCL 4 MG/2ML IJ SOLN
INTRAMUSCULAR | Status: AC
  Filled 2015-07-31: qty 2

## 2015-07-31 MED ORDER — ARTIFICIAL TEARS OP OINT
TOPICAL_OINTMENT | OPHTHALMIC | Status: AC
  Filled 2015-07-31: qty 3.5

## 2015-07-31 MED ORDER — FENTANYL CITRATE (PF) 250 MCG/5ML IJ SOLN
INTRAMUSCULAR | Status: AC
  Filled 2015-07-31: qty 5

## 2015-07-31 MED ORDER — LACTATED RINGERS IV SOLN
INTRAVENOUS | Status: DC
  Administered 2015-07-31 (×2): via INTRAVENOUS

## 2015-07-31 MED ORDER — OXYCODONE HCL 5 MG PO TABS: 5.0000 mg | ORAL_TABLET | ORAL | Status: DC | PRN

## 2015-07-31 MED ORDER — GLYCOPYRROLATE 0.2 MG/ML IV SOSY
PREFILLED_SYRINGE | INTRAVENOUS | Status: AC
  Filled 2015-07-31: qty 3

## 2015-07-31 MED ORDER — CEFAZOLIN SODIUM-DEXTROSE 2-4 GM/100ML-% IV SOLN
INTRAVENOUS | Status: AC
  Filled 2015-07-31: qty 100

## 2015-07-31 MED ORDER — SODIUM CHLORIDE 0.9 % IV SOLN: INTRAVENOUS | Status: DC

## 2015-07-31 MED ORDER — BUPIVACAINE HCL (PF) 0.25 % IJ SOLN
INTRAMUSCULAR | Status: DC | PRN
  Administered 2015-07-31: 6 mL

## 2015-07-31 MED ORDER — METOCLOPRAMIDE HCL 5 MG/ML IJ SOLN
INTRAMUSCULAR | Status: AC
  Filled 2015-07-31: qty 2

## 2015-07-31 MED ORDER — HEPARIN SOD (PORK) LOCK FLUSH 100 UNIT/ML IV SOLN
INTRAVENOUS | Status: DC | PRN
  Administered 2015-07-31: 500 [IU]

## 2015-07-31 MED ORDER — MEPERIDINE HCL 25 MG/ML IJ SOLN: 6.2500 mg | INTRAMUSCULAR | Status: DC | PRN

## 2015-07-31 MED ORDER — DEXAMETHASONE SODIUM PHOSPHATE 10 MG/ML IJ SOLN
INTRAMUSCULAR | Status: AC
  Filled 2015-07-31: qty 1

## 2015-07-31 MED ORDER — 0.9 % SODIUM CHLORIDE (POUR BTL) OPTIME
TOPICAL | Status: DC | PRN
  Administered 2015-07-31: 1000 mL

## 2015-07-31 MED ORDER — PROPOFOL 10 MG/ML IV BOLUS
INTRAVENOUS | Status: AC
  Filled 2015-07-31: qty 20

## 2015-07-31 MED ORDER — LIDOCAINE HCL (CARDIAC) 20 MG/ML IV SOLN
INTRAVENOUS | Status: DC | PRN
  Administered 2015-07-31: 100 mg via INTRATRACHEAL

## 2015-07-31 MED ORDER — HYDROMORPHONE HCL 1 MG/ML IJ SOLN
0.2500 mg | INTRAMUSCULAR | Status: DC | PRN
  Administered 2015-07-31 (×2): 0.5 mg via INTRAVENOUS

## 2015-07-31 MED ORDER — HYDROCODONE-ACETAMINOPHEN 10-325 MG PO TABS: 1.0000 | ORAL_TABLET | Freq: Four times a day (QID) | ORAL | Status: DC | PRN

## 2015-07-31 MED ORDER — SUCCINYLCHOLINE CHLORIDE 20 MG/ML IJ SOLN
INTRAMUSCULAR | Status: DC | PRN
  Administered 2015-07-31: 60 mg via INTRAVENOUS

## 2015-07-31 SURGICAL SUPPLY — 54 items
BAG DECANTER FOR FLEXI CONT (MISCELLANEOUS) ×2 IMPLANT
BLADE SURG 11 STRL SS (BLADE) ×2 IMPLANT
BLADE SURG 15 STRL LF DISP TIS (BLADE) ×1 IMPLANT
BLADE SURG 15 STRL SS (BLADE) ×2
CANISTER SUCTION 2500CC (MISCELLANEOUS) ×1 IMPLANT
CHLORAPREP W/TINT 26ML (MISCELLANEOUS) ×2 IMPLANT
COVER SURGICAL LIGHT HANDLE (MISCELLANEOUS) ×2 IMPLANT
COVER TRANSDUCER ULTRASND GEL (DRAPE) ×1 IMPLANT
CRADLE DONUT ADULT HEAD (MISCELLANEOUS) ×2 IMPLANT
DECANTER SPIKE VIAL GLASS SM (MISCELLANEOUS) ×2 IMPLANT
DRAPE C-ARM 42X72 X-RAY (DRAPES) ×2 IMPLANT
DRAPE LAPAROSCOPIC ABDOMINAL (DRAPES) ×2 IMPLANT
ELECT CAUTERY BLADE 6.4 (BLADE) ×2 IMPLANT
ELECT REM PT RETURN 9FT ADLT (ELECTROSURGICAL) ×2
ELECTRODE REM PT RTRN 9FT ADLT (ELECTROSURGICAL) ×1 IMPLANT
GAUZE SPONGE 4X4 16PLY XRAY LF (GAUZE/BANDAGES/DRESSINGS) ×2 IMPLANT
GEL ULTRASOUND 20GR AQUASONIC (MISCELLANEOUS) IMPLANT
GLOVE BIO SURGEON STRL SZ7 (GLOVE) ×1 IMPLANT
GLOVE BIOGEL PI IND STRL 7.0 (GLOVE) IMPLANT
GLOVE BIOGEL PI IND STRL 7.5 (GLOVE) ×1 IMPLANT
GLOVE BIOGEL PI INDICATOR 7.0 (GLOVE) ×2
GLOVE BIOGEL PI INDICATOR 7.5 (GLOVE) ×2
GLOVE SURG SS PI 7.0 STRL IVOR (GLOVE) ×2 IMPLANT
GLOVE SURG SS PI 7.5 STRL IVOR (GLOVE) ×1 IMPLANT
GOWN STRL REUS W/ TWL LRG LVL3 (GOWN DISPOSABLE) ×2 IMPLANT
GOWN STRL REUS W/TWL LRG LVL3 (GOWN DISPOSABLE) ×6
INTRODUCER COOK 11FR (CATHETERS) IMPLANT
KIT BASIN OR (CUSTOM PROCEDURE TRAY) ×2 IMPLANT
KIT PORT POWER 8FR ISP CVUE (Catheter) ×1 IMPLANT
KIT ROOM TURNOVER OR (KITS) ×2 IMPLANT
LIQUID BAND (GAUZE/BANDAGES/DRESSINGS) ×2 IMPLANT
NDL HYPO 25GX1X1/2 BEV (NEEDLE) ×1 IMPLANT
NEEDLE HYPO 25GX1X1/2 BEV (NEEDLE) ×2 IMPLANT
NS IRRIG 1000ML POUR BTL (IV SOLUTION) ×2 IMPLANT
PACK SURGICAL SETUP 50X90 (CUSTOM PROCEDURE TRAY) ×2 IMPLANT
PAD ARMBOARD 7.5X6 YLW CONV (MISCELLANEOUS) ×4 IMPLANT
PENCIL BUTTON HOLSTER BLD 10FT (ELECTRODE) ×2 IMPLANT
SET INTRODUCER 12FR PACEMAKER (SHEATH) IMPLANT
SET SHEATH INTRODUCER 10FR (MISCELLANEOUS) IMPLANT
SHEATH COOK PEEL AWAY SET 9F (SHEATH) IMPLANT
STAPLER VISISTAT 35W (STAPLE) ×2 IMPLANT
SUT MNCRL AB 4-0 PS2 18 (SUTURE) ×2 IMPLANT
SUT PROLENE 2 0 SH DA (SUTURE) ×2 IMPLANT
SUT SILK 2 0 (SUTURE)
SUT SILK 2-0 18XBRD TIE 12 (SUTURE) IMPLANT
SUT VIC AB 3-0 SH 27 (SUTURE) ×2
SUT VIC AB 3-0 SH 27XBRD (SUTURE) ×1 IMPLANT
SYR 20ML ECCENTRIC (SYRINGE) ×4 IMPLANT
SYR 5ML LUER SLIP (SYRINGE) ×2 IMPLANT
SYR CONTROL 10ML LL (SYRINGE) ×1 IMPLANT
TOWEL OR 17X24 6PK STRL BLUE (TOWEL DISPOSABLE) ×2 IMPLANT
TOWEL OR 17X26 10 PK STRL BLUE (TOWEL DISPOSABLE) ×2 IMPLANT
TUBE CONNECTING 12X1/4 (SUCTIONS) ×1 IMPLANT
YANKAUER SUCT BULB TIP NO VENT (SUCTIONS) ×1 IMPLANT

## 2015-07-31 NOTE — Anesthesia Postprocedure Evaluation (Signed)
Anesthesia Post Note  Patient: Courtney Gomez  Procedure(s) Performed: Procedure(s) (LRB): INSERTION PORT-A-CATH WITH ULTRASOUND  (Right)  Patient location during evaluation: PACU Anesthesia Type: General Level of consciousness: sedated and patient cooperative Pain management: pain level controlled Vital Signs Assessment: post-procedure vital signs reviewed and stable Respiratory status: spontaneous breathing Cardiovascular status: stable Anesthetic complications: no    Last Vitals:  Filed Vitals:   07/31/15 1528 07/31/15 1530  BP: 119/71   Pulse: 64 60  Temp:  36.6 C  Resp: 11 11    Last Pain:  Filed Vitals:   07/31/15 1616  PainSc: 2                  Nolon Nations

## 2015-07-31 NOTE — Transfer of Care (Signed)
Immediate Anesthesia Transfer of Care Note  Patient: Courtney Gomez  Procedure(s) Performed: Procedure(s): INSERTION PORT-A-CATH WITH ULTRASOUND  (Right)  Patient Location: PACU  Anesthesia Type:General  Level of Consciousness: awake, alert  and patient cooperative  Airway & Oxygen Therapy: Patient Spontanous Breathing and Patient connected to face mask oxygen  Post-op Assessment: Report given to RN, Post -op Vital signs reviewed and stable, Patient moving all extremities X 4 and Patient able to stick tongue midline  Post vital signs: Reviewed and stable  Last Vitals:  Filed Vitals:   07/31/15 0902  BP: 187/94  Pulse: 88  Temp: 37 C  Resp: 18    Last Pain: There were no vitals filed for this visit.       Complications: No apparent anesthesia complications

## 2015-07-31 NOTE — Discharge Instructions (Signed)
    PORT-A-CATH: POST OP INSTRUCTIONS  Always review your discharge instruction sheet given to you by the facility where your surgery was performed.   1. A prescription for pain medication may be given to you upon discharge. Take your pain medication as prescribed, if needed. If narcotic pain medicine is not needed, then you make take acetaminophen (Tylenol) or ibuprofen (Advil) as needed.  2. Take your usually prescribed medications unless otherwise directed. 3. If you need a refill on your pain medication, please contact our office. All narcotic pain medicine now requires a paper prescription.  Phoned in and fax refills are no longer allowed by law.  Prescriptions will not be filled after 5 pm or on weekends.  4. You should follow a light diet for the remainder of the day after your procedure. 5. Most patients will experience some mild swelling and/or bruising in the area of the incision. It may take several days to resolve. 6. It is common to experience some constipation if taking pain medication after surgery. Increasing fluid intake and taking a stool softener (such as Colace) will usually help or prevent this problem from occurring. A mild laxative (Milk of Magnesia or Miralax) should be taken according to package directions if there are no bowel movements after 48 hours.  7. Unless discharge instructions indicate otherwise, you may remove your bandages 48 hours after surgery, and you may shower at that time. You may have steri-strips (small white skin tapes) in place directly over the incision.  These strips should be left on the skin for 7-10 days.  If your surgeon used Dermabond (skin glue) on the incision, you may shower in 24 hours.  The glue will flake off over the next 2-3 weeks.  8. If your port is left accessed at the end of surgery (needle left in port), the dressing cannot get wet and should only by changed by a healthcare professional. When the port is no longer accessed (when the  needle has been removed), follow step 7.   9. ACTIVITIES:  Limit activity involving your arms for the next 72 hours. Do no strenuous exercise or activity for 1 week. You may drive when you are no longer taking prescription pain medication, you can comfortably wear a seatbelt, and you can maneuver your car. 10.You may need to see your doctor in the office for a follow-up appointment.  Please       check with your doctor.  11.When you receive a new Port-a-Cath, you will get a product guide and        ID card.  Please keep them in case you need them.  WHEN TO CALL YOUR DOCTOR ([phone]): 1. Fever over 101.0 2. Chills 3. Continued bleeding from incision 4. Increased redness and tenderness at the site 5. Shortness of breath, difficulty breathing   The clinic staff is available to answer your questions during regular business hours. Please don't hesitate to call and ask to speak to one of the nurses or medical assistants for clinical concerns. If you have a medical emergency, go to the nearest emergency room or call 911.  A surgeon from [facility] is always on call at the hospital.     For further information, please visit [email or web address]      

## 2015-07-31 NOTE — Interval H&P Note (Signed)
History and Physical Interval Note:  07/31/2015 10:40 AM  Courtney Gomez  has presented today for surgery, with the diagnosis of BREAST CANCER  The various methods of treatment have been discussed with the patient and family. After consideration of risks, benefits and other options for treatment, the patient has consented to  Procedure(s): INSERTION PORT-A-CATH WITH [place] (N/A) as a surgical intervention .  The patient's history has been reviewed, patient examined, no change in status, stable for surgery.  I have reviewed the patient's chart and labs.  Questions were answered to the patient's satisfaction.     Lorre Opdahl

## 2015-07-31 NOTE — H&P (Signed)
50 yof who had area near axilla that was itching. she has no prior history. she has no family history of breast or ovarian cancer. her breast density is c. there is an oval microlobulated mass in the axillary tail of the right side. US shows a 1.7x1.5x1.3 cm mass, US of the axilla is normal. core biopsy was done showing a tn grade III idc with a KI of 95%.    Other Problems Tequisha Mcmonagle; 2015/08/16 8:05 AM) Asthma Lump In Breast  Past Surgical History Effa Shaker; 2015-08-16 8:05 AM) Oral Surgery  Diagnostic Studies History Maryfrances Bunnell; 16-Aug-2015 8:05 AM) Colonoscopy within last year Mammogram within last year Pap Smear 1-5 years ago  Medication History Calirose Dubuisson; 08/16/15 8:05 AM) Medications Reconciled  Social History Katori Dalpiaz; 08-16-2015 8:05 AM) Alcohol use Occasional alcohol use. Caffeine use Carbonated beverages, Coffee. No drug use Tobacco use Never smoker.  Family History Maribel Elam; 08-16-15 8:05 AM) Arthritis Mother. Colon Polyps Father. Diabetes Mellitus Father. Heart Disease Family Members In General, Father. Heart disease in female family member before age 11 Heart disease in female family member before age 45 Hypertension Mother. Prostate Cancer Father.  Pregnancy / Birth History Cortnei Reever; 08-16-15 8:05 AM) Age at menarche 50 years. Contraceptive History Oral contraceptives. Gravida 3 Maternal age 51-25 Para 2  Review of Systems Trejure Bergamo; 2015/08/16 8:05 AM) General Not Present- Appetite Loss, Chills, Fatigue, Fever, Night Sweats, Weight Gain and Weight Loss. Skin Not Present- Change in Wart/Mole, Dryness, Hives, Jaundice, New Lesions, Non-Healing Wounds, Rash and Ulcer. HEENT Present- Wears glasses/contact lenses. Not Present- Earache, Hearing Loss, Hoarseness, Nose Bleed, Oral Ulcers, Ringing in the Ears, Seasonal Allergies, Sinus Pain, Sore Throat, Visual Disturbances and Yellow  Eyes. Respiratory Not Present- Bloody sputum, Chronic Cough, Difficulty Breathing, Snoring and Wheezing. Breast Present- Breast Mass. Not Present- Breast Pain, Nipple Discharge and Skin Changes. Cardiovascular Not Present- Chest Pain, Difficulty Breathing Lying Down, Leg Cramps, Palpitations, Rapid Heart Rate, Shortness of Breath and Swelling of Extremities. Gastrointestinal Not Present- Abdominal Pain, Bloating, Bloody Stool, Change in Bowel Habits, Chronic diarrhea, Constipation, Difficulty Swallowing, Excessive gas, Gets full quickly at meals, Hemorrhoids, Indigestion, Nausea, Rectal Pain and Vomiting. Female Genitourinary Not Present- Frequency, Nocturia, Painful Urination, Pelvic Pain and Urgency. Musculoskeletal Not Present- Back Pain, Joint Pain, Joint Stiffness, Muscle Pain, Muscle Weakness and Swelling of Extremities. Neurological Not Present- Decreased Memory, Fainting, Headaches, Numbness, Seizures, Tingling, Tremor, Trouble walking and Weakness. Psychiatric Not Present- Anxiety, Bipolar, Change in Sleep Pattern, Depression, Fearful and Frequent crying. Endocrine Not Present- Cold Intolerance, Excessive Hunger, Hair Changes, Heat Intolerance, Hot flashes and New Diabetes. Hematology Not Present- Easy Bruising, Excessive bleeding, Gland problems, HIV and Persistent Infections.   Physical Exam Rolm Bookbinder MD; 16-Aug-2015 12:20 PM) General Mental Status-Alert. Orientation-Oriented X3. Chest and Lung Exam Chest and lung exam reveals -on auscultation, normal breath sounds, no adventitious sounds and normal vocal resonance. Cardiovascular Cardiovascular examination reveals -normal heart sounds, regular rate and rhythm with no murmurs. Lymphatic Head & Neck General Head & Neck Lymphatics: Bilateral - Description - Normal. Note: no  adenopathy   Assessment & Plan Rolm Bookbinder MD; 08/16/2015 12:22 PM) BREAST CANCER OF UPPER-OUTER QUADRANT OF RIGHT FEMALE BREAST  (C50.411) Story: port placement, mri, genetics, primary chemotherapy discussed staging and pathophysiology of breast cancer, meets criteria for genetics and will undergo testing. this will have implications on surgery. due to tn and awaiting genetics will proceed with systemic therapy. discussed right ij port placement next week. we did discuss  surgery after chemotherapy and will base on response, mri and genetics. we discussed likely sentinel node biopsy. discussed lumpectomy vs mastectomy as well.

## 2015-07-31 NOTE — Op Note (Signed)
Preoperative diagnosis: breast cancer need for venous access Postoperative diagnosis: same as above Procedure: right ij US guided powerport insertion Surgeon: Dr Serita Grammes EBL: minimal Anes: general  Specimens none Complications none Drains none Sponge count correct Dispo to pacu stable  Indications: This is a 56 yof due to begin systemic therapy for breast cancer. We discussed port placement.   Procedure: After informed consent was obtained the patient was taken to the operating room. She was given antibiotics. Sequential compression devices were on her legs. She was then placed under general anesthesia with an LMA. Then she was then prepped and draped in the standard sterile surgical fashion. Surgical timeout was then performed.  I used the ultrasound to identify the right internal jugular vein. I then accessed the vein using the ultrasound. This aspirated blood. I then placed the wire.This was confirmed by fluoroscopy to be in the correct position. I created a pocket on the right chest. I tunneled the line between the 2 sites. I then dilated the tract and placed the dilator assembly with the sheath. This was done under fluoroscopy. I then removed the sheath and dilator. The wire was also removed. The line was then pulled back to be in the distal cava. I hooked this up to the port. I sutured this into place with 2-0 Prolene in 2 places. This aspirated blood and flushed easily.This was confirmed with a final fluoroscopy. I then closed this with 2-0 Vicryl and 4-0 Monocryl. Dermabond was placed on both the incisions.She tolerated this well and was transferred to the recovery room in stable condition.

## 2015-07-31 NOTE — Anesthesia Preprocedure Evaluation (Signed)
Anesthesia Evaluation  Patient identified by MRN, date of birth, ID band Patient awake    Reviewed: Allergy & Precautions, NPO status , Patient's Chart, lab work & pertinent test results  Airway Mallampati: II  TM Distance: >3 FB Neck ROM: Full    Dental no notable dental hx.    Pulmonary neg pulmonary ROS,    Pulmonary exam normal breath sounds clear to auscultation       Cardiovascular negative cardio ROS Normal cardiovascular exam Rhythm:Regular Rate:Normal     Neuro/Psych negative neurological ROS  negative psych ROS   GI/Hepatic negative GI ROS, Neg liver ROS,   Endo/Other  negative endocrine ROS  Renal/GU negative Renal ROS     Musculoskeletal negative musculoskeletal ROS (+)   Abdominal   Peds  Hematology negative hematology ROS (+)   Anesthesia Other Findings   Reproductive/Obstetrics negative OB ROS                             Anesthesia Physical Anesthesia Plan  ASA: II  Anesthesia Plan: General   Post-op Pain Management:    Induction: Intravenous  Airway Management Planned: LMA  Additional Equipment:   Intra-op Plan:   Post-operative Plan: Extubation in OR  Informed Consent: I have reviewed the patients History and Physical, chart, labs and discussed the procedure including the risks, benefits and alternatives for the proposed anesthesia with the patient or authorized representative who has indicated his/her understanding and acceptance.   Dental advisory given  Plan Discussed with: CRNA  Anesthesia Plan Comments:         Anesthesia Quick Evaluation  

## 2015-07-31 NOTE — Anesthesia Procedure Notes (Signed)
Procedure Name: Intubation Date/Time: 07/31/2015 11:34 AM Performed by: Collier Bullock Pre-anesthesia Checklist: Patient identified, Emergency Drugs available, Suction available, Patient being monitored and Timeout performed Patient Re-evaluated:Patient Re-evaluated prior to inductionOxygen Delivery Method: Circle system utilized Preoxygenation: Pre-oxygenation with 100% oxygen Intubation Type: IV induction Ventilation: Mask ventilation without difficulty Laryngoscope Size: Mac and 3 (LMA 4 and 3 tried without success, MDA tried without success, ET tube placed without  problems) Grade View: Grade I Tube type: Oral Tube size: 7.0 mm Number of attempts: 1 Airway Equipment and Method: Stylet Placement Confirmation: ETT inserted through vocal cords under direct vision,  positive ETCO2 and breath sounds checked- equal and bilateral Secured at: 23 cm Dental Injury: Bloody posterior oropharynx  Comments: LMA 4 and 3  will not sit correctly.

## 2015-08-01 ENCOUNTER — Encounter (HOSPITAL_COMMUNITY): Payer: Self-pay | Admitting: General Surgery

## 2015-08-01 ENCOUNTER — Ambulatory Visit (HOSPITAL_COMMUNITY)
Admission: RE | Admit: 2015-08-01 | Discharge: 2015-08-01 | Disposition: A | Discharge status: Home / Self Care | Payer: 59 | Source: Ambulatory Visit | Attending: Hematology and Oncology | Admitting: Hematology and Oncology

## 2015-08-01 DIAGNOSIS — I340 Nonrheumatic mitral (valve) insufficiency: Secondary | ICD-10-CM | POA: Insufficient documentation

## 2015-08-01 DIAGNOSIS — C50411 Malignant neoplasm of upper-outer quadrant of right female breast: Secondary | ICD-10-CM

## 2015-08-01 NOTE — Assessment & Plan Note (Signed)
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 Neoadjuvant chemotherapy with Adriamycin and Cytoxan dose dense 4 followed by Abraxane weekly 12 2. Followed by breast conserving surgery with sentinel lymph node study 3. Followed by adjuvant radiation therapy ------------------------------------------------------------------------------------------------------------------------------ Current treatment: Today cycle 1 day 1 of dose dense Adriamycin and Cytoxan Lab work was reviewed Echocardiogram 08/01/2015: EF 65-70% Breast MRI: 07/30/2015: right breast mass 2.4 x 2.1 x 2.1 cm with a preserved fat plane between the mass in the chest wall, 5 mm enhancing mass in the right breast at 12:00 C, could be fibroadenoma biopsy recommended  Plan: 1. MRI guided biopsy of the 5 mm lesion in the right breast 2. Return to clinic in 1 week for toxicity check and follow-up 3. Antiemetics were reviewed in detail   

## 2015-08-01 NOTE — Progress Notes (Signed)
Echocardiogram 2D Echocardiogram has been performed.  Tresa Res 08/01/2015, 10:56 AM

## 2015-08-02 ENCOUNTER — Ambulatory Visit: Payer: 59 | Admitting: Hematology and Oncology

## 2015-08-02 ENCOUNTER — Other Ambulatory Visit: Payer: Self-pay | Admitting: Hematology and Oncology

## 2015-08-02 ENCOUNTER — Ambulatory Visit (HOSPITAL_BASED_OUTPATIENT_CLINIC_OR_DEPARTMENT_OTHER): Payer: 59

## 2015-08-02 ENCOUNTER — Other Ambulatory Visit (HOSPITAL_BASED_OUTPATIENT_CLINIC_OR_DEPARTMENT_OTHER): Payer: 59

## 2015-08-02 ENCOUNTER — Other Ambulatory Visit: Payer: 59

## 2015-08-02 ENCOUNTER — Ambulatory Visit (HOSPITAL_BASED_OUTPATIENT_CLINIC_OR_DEPARTMENT_OTHER): Payer: 59 | Admitting: Hematology and Oncology

## 2015-08-02 ENCOUNTER — Encounter: Payer: Self-pay | Admitting: Hematology and Oncology

## 2015-08-02 ENCOUNTER — Encounter: Payer: Self-pay | Admitting: *Deleted

## 2015-08-02 VITALS — BP 152/77 | HR 77 | Temp 98.4°F | Resp 18 | Ht 66.0 in | Wt 136.4 lb

## 2015-08-02 DIAGNOSIS — C50411 Malignant neoplasm of upper-outer quadrant of right female breast: Secondary | ICD-10-CM

## 2015-08-02 DIAGNOSIS — Z5189 Encounter for other specified aftercare: Secondary | ICD-10-CM | POA: Diagnosis not present

## 2015-08-02 DIAGNOSIS — Z5111 Encounter for antineoplastic chemotherapy: Secondary | ICD-10-CM

## 2015-08-02 LAB — COMPREHENSIVE METABOLIC PANEL
ALT: 36 U/L (ref 0–55)
ANION GAP: 9 meq/L (ref 3–11)
AST: 29 U/L (ref 5–34)
Albumin: 3.8 g/dL (ref 3.5–5.0)
Alkaline Phosphatase: 57 U/L (ref 40–150)
BUN: 12.8 mg/dL (ref 7.0–26.0)
CALCIUM: 9.4 mg/dL (ref 8.4–10.4)
CHLORIDE: 105 meq/L (ref 98–109)
CO2: 29 meq/L (ref 22–29)
CREATININE: 1 mg/dL (ref 0.6–1.1)
EGFR: 69 mL/min/{1.73_m2} — ABNORMAL LOW (ref >90)
Glucose: 107 mg/dl (ref 70–140)
POTASSIUM: 4.5 meq/L (ref 3.5–5.1)
Sodium: 143 mEq/L (ref 136–145)
Total Bilirubin: 0.47 mg/dL (ref 0.20–1.20)
Total Protein: 6.9 g/dL (ref 6.4–8.3)

## 2015-08-02 LAB — CBC WITH DIFFERENTIAL/PLATELET
BASO%: 0.4 % (ref 0.0–2.0)
Basophils Absolute: 0 10*3/uL (ref 0.0–0.1)
EOS%: 0.8 % (ref 0.0–7.0)
Eosinophils Absolute: 0.1 10*3/uL (ref 0.0–0.5)
HEMATOCRIT: 41.2 % (ref 34.8–46.6)
HGB: 13.6 g/dL (ref 11.6–15.9)
LYMPH%: 42.7 % (ref 14.0–49.7)
MCH: 32.2 pg (ref 25.1–34.0)
MCHC: 32.9 g/dL (ref 31.5–36.0)
MCV: 97.8 fL (ref 79.5–101.0)
MONO#: 0.4 10*3/uL (ref 0.1–0.9)
MONO%: 5.6 % (ref 0.0–14.0)
NEUT#: 3.4 10*3/uL (ref 1.5–6.5)
NEUT%: 50.5 % (ref 38.4–76.8)
PLATELETS: 182 10*3/uL (ref 145–400)
RBC: 4.21 10*6/uL (ref 3.70–5.45)
RDW: 12.8 % (ref 11.2–14.5)
WBC: 6.8 10*3/uL (ref 3.9–10.3)
lymph#: 2.9 10*3/uL (ref 0.9–3.3)

## 2015-08-02 MED ORDER — PEGFILGRASTIM 6 MG/0.6ML ~~LOC~~ PSKT
6.0000 mg | PREFILLED_SYRINGE | Freq: Once | SUBCUTANEOUS | Status: AC
  Administered 2015-08-02: 6 mg via SUBCUTANEOUS
  Filled 2015-08-02: qty 0.6

## 2015-08-02 MED ORDER — SODIUM CHLORIDE 0.9 % IV SOLN
Freq: Once | INTRAVENOUS | Status: AC
  Administered 2015-08-02: 09:00:00 via INTRAVENOUS

## 2015-08-02 MED ORDER — SODIUM CHLORIDE 0.9 % IV SOLN
Freq: Once | INTRAVENOUS | Status: AC
  Administered 2015-08-02: 10:00:00 via INTRAVENOUS
  Filled 2015-08-02: qty 5

## 2015-08-02 MED ORDER — HEPARIN SOD (PORK) LOCK FLUSH 100 UNIT/ML IV SOLN
500.0000 [IU] | Freq: Once | INTRAVENOUS | Status: AC | PRN
  Administered 2015-08-02: 500 [IU]
  Filled 2015-08-02: qty 5

## 2015-08-02 MED ORDER — PALONOSETRON HCL INJECTION 0.25 MG/5ML
INTRAVENOUS | Status: AC
  Filled 2015-08-02: qty 5

## 2015-08-02 MED ORDER — PALONOSETRON HCL INJECTION 0.25 MG/5ML
0.2500 mg | Freq: Once | INTRAVENOUS | Status: AC
  Administered 2015-08-02: 0.25 mg via INTRAVENOUS

## 2015-08-02 MED ORDER — DOXORUBICIN HCL CHEMO IV INJECTION 2 MG/ML
59.0000 mg/m2 | Freq: Once | INTRAVENOUS | Status: AC
  Administered 2015-08-02: 100 mg via INTRAVENOUS
  Filled 2015-08-02: qty 50

## 2015-08-02 MED ORDER — SODIUM CHLORIDE 0.9% FLUSH
10.0000 mL | INTRAVENOUS | Status: DC | PRN
  Administered 2015-08-02: 10 mL
  Filled 2015-08-02: qty 10

## 2015-08-02 MED ORDER — SODIUM CHLORIDE 0.9 % IV SOLN
600.0000 mg/m2 | Freq: Once | INTRAVENOUS | Status: AC
  Administered 2015-08-02: 1020 mg via INTRAVENOUS
  Filled 2015-08-02: qty 51

## 2015-08-02 NOTE — Patient Instructions (Signed)
Doxorubicin injection  What is this medicine?  DOXORUBICIN (dox oh ROO bi sin) is a chemotherapy drug. It is used to treat many kinds of cancer like Hodgkin's disease, leukemia, non-Hodgkin's lymphoma, neuroblastoma, sarcoma, and Wilms' tumor. It is also used to treat bladder cancer, breast cancer, lung cancer, ovarian cancer, stomach cancer, and thyroid cancer.  This medicine may be used for other purposes; ask your health care provider or pharmacist if you have questions.  What should I tell my health care provider before I take this medicine?  They need to know if you have any of these conditions:  -blood disorders  -heart disease, recent heart attack  -infection (especially a virus infection such as chickenpox, cold sores, or herpes)  -irregular heartbeat  -liver disease  -recent or ongoing radiation therapy  -an unusual or allergic reaction to doxorubicin, other chemotherapy agents, other medicines, foods, dyes, or preservatives  -pregnant or trying to get pregnant  -breast-feeding  How should I use this medicine?  This drug is given as an infusion into a vein. It is administered in a hospital or clinic by a specially trained health care professional. If you have pain, swelling, burning or any unusual feeling around the site of your injection, tell your health care professional right away.  Talk to your pediatrician regarding the use of this medicine in children. Special care may be needed.  Overdosage: If you think you have taken too much of this medicine contact a poison control center or emergency room at once.  NOTE: This medicine is only for you. Do not share this medicine with others.  What if I miss a dose?  It is important not to miss your dose. Call your doctor or health care professional if you are unable to keep an appointment.  What may interact with this medicine?  Do not take this medicine with any of the following medications:  -cisapride  -droperidol  -halofantrine  -pimozide  -zidovudine  This  medicine may also interact with the following medications:  -chloroquine  -chlorpromazine  -clarithromycin  -cyclophosphamide  -cyclosporine  -erythromycin  -medicines for depression, anxiety, or psychotic disturbances  -medicines for irregular heart beat like amiodarone, bepridil, dofetilide, encainide, flecainide, propafenone, quinidine  -medicines for seizures like ethotoin, fosphenytoin, phenytoin  -medicines for nausea, vomiting like dolasetron, ondansetron, palonosetron  -medicines to increase blood counts like filgrastim, pegfilgrastim, sargramostim  -methadone  -methotrexate  -pentamidine  -progesterone  -vaccines  -verapamil  Talk to your doctor or health care professional before taking any of these medicines:  -acetaminophen  -aspirin  -ibuprofen  -ketoprofen  -naproxen  This list may not describe all possible interactions. Give your health care provider a list of all the medicines, herbs, non-prescription drugs, or dietary supplements you use. Also tell them if you smoke, drink alcohol, or use illegal drugs. Some items may interact with your medicine.  What should I watch for while using this medicine?  Your condition will be monitored carefully while you are receiving this medicine. You will need important blood work done while you are taking this medicine.  This drug may make you feel generally unwell. This is not uncommon, as chemotherapy can affect healthy cells as well as cancer cells. Report any side effects. Continue your course of treatment even though you feel ill unless your doctor tells you to stop.  Your urine may turn red for a few days after your dose. This is not blood. If your urine is dark or brown, call your doctor.    In some cases, you may be given additional medicines to help with side effects. Follow all directions for their use.  Call your doctor or health care professional for advice if you get a fever, chills or sore throat, or other symptoms of a cold or flu. Do not treat yourself.  This drug decreases your body's ability to fight infections. Try to avoid being around people who are sick.  This medicine may increase your risk to bruise or bleed. Call your doctor or health care professional if you notice any unusual bleeding.  Be careful brushing and flossing your teeth or using a toothpick because you may get an infection or bleed more easily. If you have any dental work done, tell your dentist you are receiving this medicine.  Avoid taking products that contain aspirin, acetaminophen, ibuprofen, naproxen, or ketoprofen unless instructed by your doctor. These medicines may hide a fever.  Men and women of childbearing age should use effective birth control methods while using taking this medicine. Do not become pregnant while taking this medicine. There is a potential for serious side effects to an unborn child. Talk to your health care professional or pharmacist for more information. Do not breast-feed an infant while taking this medicine.  Do not let others touch your urine or other body fluids for 5 days after each treatment with this medicine. Caregivers should wear latex gloves to avoid touching body fluids during this time.  There is a maximum amount of this medicine you should receive throughout your life. The amount depends on the medical condition being treated and your overall health. Your doctor will watch how much of this medicine you receive in your lifetime. Tell your doctor if you have taken this medicine before.  What side effects may I notice from receiving this medicine?  Side effects that you should report to your doctor or health care professional as soon as possible:  -allergic reactions like skin rash, itching or hives, swelling of the face, lips, or tongue  -low blood counts - this medicine may decrease the number of white blood cells, red blood cells and platelets. You may be at increased risk for infections and bleeding.  -signs of infection - fever or chills, cough,  sore throat, pain or difficulty passing urine  -signs of decreased platelets or bleeding - bruising, pinpoint red spots on the skin, black, tarry stools, blood in the urine  -signs of decreased red blood cells - unusually weak or tired, fainting spells, lightheadedness  -breathing problems  -chest pain  -fast, irregular heartbeat  -mouth sores  -nausea, vomiting  -pain, swelling, redness at site where injected  -pain, tingling, numbness in the hands or feet  -swelling of ankles, feet, or hands  -unusual bleeding or bruising  Side effects that usually do not require medical attention (report to your doctor or health care professional if they continue or are bothersome):  -diarrhea  -facial flushing  -hair loss  -loss of appetite  -missed menstrual periods  -nail discoloration or damage  -red or watery eyes  -red colored urine  -stomach upset  This list may not describe all possible side effects. Call your doctor for medical advice about side effects. You may report side effects to FDA at 1-800-FDA-1088.  Where should I keep my medicine?  This drug is given in a hospital or clinic and will not be stored at home.  NOTE: This sheet is a summary. It may not cover all possible information. If you have questions about   this medicine, talk to your doctor, pharmacist, or health care provider.     © 2016, [employer]. (2012-07-12 09:54:34)  Cyclophosphamide injection  What is this medicine?  CYCLOPHOSPHAMIDE (sye kloe FOSS fa mide) is a chemotherapy drug. It slows the growth of cancer cells. This medicine is used to treat many types of cancer like lymphoma, myeloma, leukemia, breast cancer, and ovarian cancer, to name a few.  This medicine may be used for other purposes; ask your health care provider or pharmacist if you have questions.  What should I tell my health care provider before I take this medicine?  They need to know if you have any of these conditions:  -blood disorders  -history of other  chemotherapy  -infection  -kidney disease  -liver disease  -recent or ongoing radiation therapy  -tumors in the bone marrow  -an unusual or allergic reaction to cyclophosphamide, other chemotherapy, other medicines, foods, dyes, or preservatives  -pregnant or trying to get pregnant  -breast-feeding  How should I use this medicine?  This drug is usually given as an injection into a vein or muscle or by infusion into a vein. It is administered in a hospital or clinic by a specially trained health care professional.  Talk to your pediatrician regarding the use of this medicine in children. Special care may be needed.  Overdosage: If you think you have taken too much of this medicine contact a poison control center or emergency room at once.  NOTE: This medicine is only for you. Do not share this medicine with others.  What if I miss a dose?  It is important not to miss your dose. Call your doctor or health care professional if you are unable to keep an appointment.  What may interact with this medicine?  This medicine may interact with the following medications:  -amiodarone  -amphotericin B  -azathioprine  -certain antiviral medicines for HIV or AIDS such as protease inhibitors (e.g., indinavir, ritonavir) and zidovudine  -certain blood pressure medications such as benazepril, captopril, enalapril, fosinopril, lisinopril, moexipril, monopril, perindopril, quinapril, ramipril, trandolapril  -certain cancer medications such as anthracyclines (e.g., daunorubicin, doxorubicin), busulfan, cytarabine, paclitaxel, pentostatin, tamoxifen, trastuzumab  -certain diuretics such as chlorothiazide, chlorthalidone, hydrochlorothiazide, indapamide, metolazone  -certain medicines that treat or prevent blood clots like warfarin  -certain muscle relaxants such as succinylcholine  -cyclosporine  -etanercept  -indomethacin  -medicines to increase blood counts like filgrastim, pegfilgrastim, sargramostim  -medicines used as general  anesthesia  -metronidazole  -natalizumab  This list may not describe all possible interactions. Give your health care provider a list of all the medicines, herbs, non-prescription drugs, or dietary supplements you use. Also tell them if you smoke, drink alcohol, or use illegal drugs. Some items may interact with your medicine.  What should I watch for while using this medicine?  Visit your doctor for checks on your progress. This drug may make you feel generally unwell. This is not uncommon, as chemotherapy can affect healthy cells as well as cancer cells. Report any side effects. Continue your course of treatment even though you feel ill unless your doctor tells you to stop.  Drink water or other fluids as directed. Urinate often, even at night.  In some cases, you may be given additional medicines to help with side effects. Follow all directions for their use.  Call your doctor or health care professional for advice if you get a fever, chills or sore throat, or other symptoms of a cold or flu. Do   not treat yourself. This drug decreases your body's ability to fight infections. Try to avoid being around people who are sick.  This medicine may increase your risk to bruise or bleed. Call your doctor or health care professional if you notice any unusual bleeding.  Be careful brushing and flossing your teeth or using a toothpick because you may get an infection or bleed more easily. If you have any dental work done, tell your dentist you are receiving this medicine.  You may get drowsy or dizzy. Do not drive, use machinery, or do anything that needs mental alertness until you know how this medicine affects you.  Do not become pregnant while taking this medicine or for 1 year after stopping it. Women should inform their doctor if they wish to become pregnant or think they might be pregnant. Men should not father a child while taking this medicine and for 4 months after stopping it. There is a potential for serious side  effects to an unborn child. Talk to your health care professional or pharmacist for more information. Do not breast-feed an infant while taking this medicine.  This medicine may interfere with the ability to have a child. This medicine has caused ovarian failure in some women. This medicine has caused reduced sperm counts in some men. You should talk with your doctor or health care professional if you are concerned about your fertility.  If you are going to have surgery, tell your doctor or health care professional that you have taken this medicine.  What side effects may I notice from receiving this medicine?  Side effects that you should report to your doctor or health care professional as soon as possible:  -allergic reactions like skin rash, itching or hives, swelling of the face, lips, or tongue  -low blood counts - this medicine may decrease the number of white blood cells, red blood cells and platelets. You may be at increased risk for infections and bleeding.  -signs of infection - fever or chills, cough, sore throat, pain or difficulty passing urine  -signs of decreased platelets or bleeding - bruising, pinpoint red spots on the skin, black, tarry stools, blood in the urine  -signs of decreased red blood cells - unusually weak or tired, fainting spells, lightheadedness  -breathing problems  -dark urine  -dizziness  -palpitations  -swelling of the ankles, feet, hands  -trouble passing urine or change in the amount of urine  -weight gain  -yellowing of the eyes or skin  Side effects that usually do not require medical attention (report to your doctor or health care professional if they continue or are bothersome):  -changes in nail or skin color  -hair loss  -missed menstrual periods  -mouth sores  -nausea, vomiting  This list may not describe all possible side effects. Call your doctor for medical advice about side effects. You may report side effects to FDA at 1-800-FDA-1088.  Where should I keep my  medicine?  This drug is given in a hospital or clinic and will not be stored at home.  NOTE: This sheet is a summary. It may not cover all possible information. If you have questions about this medicine, talk to your doctor, pharmacist, or health care provider.     © 2016, [employer]. (2012-01-29 16:22:58)

## 2015-08-02 NOTE — Progress Notes (Signed)
Patient Care Team: Vania Rea, MD as PCP - General (Obstetrics and Gynecology)  DIAGNOSIS: Breast cancer of upper-outer quadrant of right female breast [facility])   Staging form: Breast, AJCC 7th Edition     Clinical stage from 07/24/2015: Stage IA (T1c, N0, M0) - Unsigned   SUMMARY OF ONCOLOGIC HISTORY:   Breast cancer of upper-outer quadrant of right female breast ([place])   07/11/2015 Mammogram Right breast mass in the axillary tail 1.7 x 1.5 x 1.3 cm axillary ultrasound negative, T1c N0 stage IA clinical stage   07/11/2015 Initial Diagnosis Right breast biopsy 10:00 position: Invasive ductal carcinoma grade 3, ER 0%, P of 0%, Ki-67 95%, HER-2 negative ratio 1.21   08/02/2015 -  Neo-Adjuvant Chemotherapy Dose dense Adriamycin and Cytoxan 4 followed by Abraxane weekly 12    CHIEF COMPLIANT: Cycle 1 day 1 dose dense Adriamycin and Cytoxan  INTERVAL HISTORY: Courtney Gomez is a 50 year old with above-mentioned history right breast cancer currently starting neoadjuvant chemotherapy with dose dense Adriamycin and Cytoxan. Today cycle 1 day 1 of treatment. She appears to be slightly anxious. She had a breast MRI which showed a 5 mm lesion that is scheduled to be biopsied on May 10.  REVIEW OF SYSTEMS:   Constitutional: Denies fevers, chills or abnormal weight loss Eyes: Denies blurriness of vision Ears, nose, mouth, throat, and face: Denies mucositis or sore throat Respiratory: Denies cough, dyspnea or wheezes Cardiovascular: Denies palpitation, chest discomfort Gastrointestinal:  Denies nausea, heartburn or change in bowel habits Skin: Denies abnormal skin rashes Lymphatics: Denies new lymphadenopathy or easy bruising Neurological:Denies numbness, tingling or new weaknesses Behavioral/Psych: Appears anxious Extremities: No lower extremity edema Breast:  denies any pain or lumps or nodules in either breasts All other systems were reviewed with the patient and are negative.  I have  reviewed the past medical history, past surgical history, social history and family history with the patient and they are unchanged from previous note.  ALLERGIES:  is allergic to lanolin and latex.  MEDICATIONS:  Current Outpatient Prescriptions  Medication Sig Dispense Refill  . ADVAIR DISKUS 250-50 MCG/DOSE AEPB Inhale 1 puff into the lungs daily.   5  . albuterol (PROAIR HFA) 108 (90 BASE) MCG/ACT inhaler Inhale 2 puffs into the lungs every 6 (six) hours as needed for wheezing or shortness of breath.    . cetirizine (ZYRTEC) 10 MG tablet Take 10 mg by mouth daily.    . fluticasone (FLONASE) 50 MCG/ACT nasal spray Place 1 spray into both nostrils daily.    Marland Kitchen HYDROcodone-acetaminophen (NORCO) 10-325 MG tablet Take 1 tablet by mouth every 6 (six) hours as needed. 10 tablet 0  . lidocaine-prilocaine (EMLA) cream Apply to affected area once 30 g 3  . LORazepam (ATIVAN) 0.5 MG tablet Take 1 tablet (0.5 mg total) by mouth at bedtime. 30 tablet 0  . montelukast (SINGULAIR) 10 MG tablet TAKE 1 TABLET (10 MG TOTAL) BY MOUTH AT BEDTIME. 30 tablet 3  . ondansetron (ZOFRAN) 8 MG tablet Take 1 tablet (8 mg total) by mouth 2 (two) times daily as needed. Start on the third day after chemotherapy. 30 tablet 1  . prochlorperazine (COMPAZINE) 10 MG tablet Take 1 tablet (10 mg total) by mouth every 6 (six) hours as needed (Nausea or vomiting). 30 tablet 1   No current facility-administered medications for this visit.    PHYSICAL EXAMINATION: ECOG PERFORMANCE STATUS: 0 - Asymptomatic  Filed Vitals:   08/02/15 0807  BP: 152/77  Pulse: 77  Temp: 98.4 F (36.9 C)  Resp: 18   Filed Weights   08/02/15 0807  Weight: 136 lb 6.4 oz (61.871 kg)    GENERAL:alert, no distress and comfortable SKIN: skin color, texture, turgor are normal, no rashes or significant lesions EYES: normal, Conjunctiva are pink and non-injected, sclera clear OROPHARYNX:no exudate, no erythema and lips, buccal mucosa, and tongue  normal  NECK: supple, thyroid normal size, non-tender, without nodularity LYMPH:  no palpable lymphadenopathy in the cervical, axillary or inguinal LUNGS: clear to auscultation and percussion with normal breathing effort HEART: regular rate & rhythm and no murmurs and no lower extremity edema ABDOMEN:abdomen soft, non-tender and normal bowel sounds MUSCULOSKELETAL:no cyanosis of digits and no clubbing  NEURO: alert & oriented x 3 with fluent speech, no focal motor/sensory deficits EXTREMITIES: No lower extremity edema LABORATORY DATA:  I have reviewed the data as listed   Chemistry      Component Value Date/Time   NA 143 07/24/2015 0808   K 3.8 07/24/2015 0808   CO2 27 07/24/2015 0808   BUN 7.8 07/24/2015 0808   CREATININE 0.9 07/24/2015 0808      Component Value Date/Time   CALCIUM 9.9 07/24/2015 0808   ALKPHOS 70 07/24/2015 0808   AST 22 07/24/2015 0808   ALT 25 07/24/2015 0808   BILITOT 0.59 07/24/2015 0808     Lab Results  Component Value Date   WBC 6.8 08/02/2015   HGB 13.6 08/02/2015   HCT 41.2 08/02/2015   MCV 97.8 08/02/2015   PLT 182 08/02/2015   NEUTROABS 3.4 08/02/2015   ASSESSMENT & PLAN:  Breast cancer of upper-outer quadrant of right female breast ([place]) Right mammogram and ultrasound 07/28/2015 Right breast mass in the axillary tail 1.7 x 1.5 x 1.3 cm axillary ultrasound negative, T1c N0 stage IA clinical stage Right breast biopsy 07/11/2015: 10:00 position: Invasive ductal carcinoma grade 3, ER 0%, PR 0%, Ki-67 95%, HER-2 negative ratio 1.21  Treatment plan based on multidisciplinary tumor board: 1. Neoadjuvant chemotherapy with Adriamycin and Cytoxan dose dense 4 followed by Abraxane weekly 12 2. Followed by breast conserving surgery with sentinel lymph node study 3. Followed by adjuvant radiation therapy ------------------------------------------------------------------------------------------------------------------------------ Current treatment:  Today cycle 1 day 1 of dose dense Adriamycin and Cytoxan Lab work was reviewed Echocardiogram 08/01/2015: EF 65-70% Breast MRI: 07/30/2015: right breast mass 2.4 x 2.1 x 2.1 cm with a preserved fat plane between the mass in the chest wall, 5 mm enhancing mass in the right breast at 12:00 C, could be fibroadenoma vs cancer biopsy recommended  Plan: 1. MRI guided biopsy of the 5 mm lesion in the right breast 2. Return to clinic in 1 week for toxicity check and follow-up 3. Antiemetics were reviewed in detail  No orders of the defined types were placed in this encounter.   The patient has a good understanding of the overall plan. she agrees with it. she will call with any problems that may develop before the next visit here.   Rulon Eisenmenger, MD 08/02/2015

## 2015-08-05 ENCOUNTER — Encounter (HOSPITAL_COMMUNITY): Payer: Self-pay | Admitting: Internal Medicine

## 2015-08-05 ENCOUNTER — Ambulatory Visit (HOSPITAL_COMMUNITY)
Admission: RE | Admit: 2015-08-05 | Discharge: 2015-08-05 | Disposition: A | Discharge status: Home / Self Care | Payer: 59 | Source: Ambulatory Visit | Attending: Internal Medicine | Admitting: Internal Medicine

## 2015-08-05 ENCOUNTER — Telehealth: Payer: Self-pay

## 2015-08-05 VITALS — BP 136/82 | HR 86 | Wt 135.5 lb

## 2015-08-05 DIAGNOSIS — J4520 Mild intermittent asthma, uncomplicated: Secondary | ICD-10-CM

## 2015-08-05 DIAGNOSIS — C50411 Malignant neoplasm of upper-outer quadrant of right female breast: Secondary | ICD-10-CM

## 2015-08-05 DIAGNOSIS — Z8249 Family history of ischemic heart disease and other diseases of the circulatory system: Secondary | ICD-10-CM | POA: Diagnosis not present

## 2015-08-05 DIAGNOSIS — Z91040 Latex allergy status: Secondary | ICD-10-CM | POA: Diagnosis not present

## 2015-08-05 DIAGNOSIS — J45909 Unspecified asthma, uncomplicated: Secondary | ICD-10-CM | POA: Diagnosis not present

## 2015-08-05 DIAGNOSIS — Z79899 Other long term (current) drug therapy: Secondary | ICD-10-CM | POA: Insufficient documentation

## 2015-08-05 NOTE — Patient Instructions (Signed)
Your physician recommends that you schedule a follow-up appointment in: 6 weeks with echocardiogram  

## 2015-08-05 NOTE — Progress Notes (Signed)
Patient ID: Courtney Gomez, female   DOB: 02-Jul-1965, 50 y.o.   MRN: [id number]   [facility] CONSULT NOTE  Referring Physician: Dr Lindi Adie Primary Care: Primary Cardiologist: Dr Haroldine Laws  Oncologist: Dr Lindi Adie. General Surgeon: Dr Donne Hazel.    HPI: Courtney Gomez is a 50 year old with a history of asthma, right breast cancer,invasive ductal carcinoma grade 3, ER 0%, P of 0%, Ki-67 95%, HER-2 negative ratio 1.21 receiving neoadjuvant chemotherapy with dose dense Adriamycin and Cytoxan x4 followed by abraxane weekly x 12. Courtney Gomez has not cardiac history. Courtney Gomez is referred to the cardio-onc clinic by Dr Lindi Adie.    Today Courtney Gomez presents as new cardio-onc patient. Overall feeling good. Completed first treatment last week. Had mild fatigue but resolved. Denies SOB/Orthopnea. Walks 2-3 miles per day. Continues to work full time.   ECHO 08/01/2015 EF 65% GLS -19.9 Lat S' 12.5    Family History  Mom HTN Dad: CAD   Review of Systems: [y] = yes, _0  = no   General: Weight gain _1 ; Weight loss _2 ; Anorexia _3 ; Fatigue [ Y]; Fever _4 ; Chills _5 ; Weakness _6   Cardiac: Chest pain/pressure _7 ; Resting SOB _8 ; Exertional SOB _9 ; Orthopnea _10 ; Pedal Edema _11 ; Palpitations _12 ; Syncope _13 ; Presyncope _14 ; Paroxysmal nocturnal dyspnea_15   Pulmonary: Cough _16 ; Wheezing_17 ; Hemoptysis_18 ; Sputum _19 ; Snoring _20   GI: Vomiting_21 ; Dysphagia_22 ; Melena_23 ; Hematochezia _24 ; Heartburn_25 ; Abdominal pain _26 ; Constipation _27 ; Diarrhea _28 ; BRBPR _29   GU: Hematuria_30 ; Dysuria _31 ; Nocturia_32   Vascular: Pain in legs with walking _33 ; Pain in feet with lying flat _34 ; Non-healing sores _35 ; Stroke _36 ; TIA _37 ; Slurred speech _38 ;  Neuro: Headaches_39 ; Vertigo_40 ; Seizures_41 ; Paresthesias_42 ;Blurred vision _43 ; Diplopia _44 ; Vision changes _45   Ortho/Skin: Arthritis _46 ; Joint pain [ Y]; Muscle pain _47 ; Joint swelling _48 ; Back Pain _49 ; Rash _50   Psych: Depression_51 ; Anxiety_52   Heme:  Bleeding problems _53 ; Clotting disorders _54 ; Anemia _55   Endocrine: Diabetes _56 ; Thyroid dysfunction_57    Past Medical History  Diagnosis Date  . Asthma   . Breast cancer of upper-outer quadrant of right female breast ([place]) 07/15/2015  . History of motion sickness   . Eczema   . Anxiety     not currently    Current Outpatient Prescriptions  Medication Sig Dispense Refill  . ADVAIR DISKUS 250-50 MCG/DOSE AEPB Inhale 1 puff into the lungs daily.   5  . albuterol (PROAIR HFA) 108 (90 BASE) MCG/ACT inhaler Inhale 2 puffs into the lungs every 6 (six) hours as needed for wheezing or shortness of breath.    . cetirizine (ZYRTEC) 10 MG tablet Take 10 mg by mouth daily.    . fluticasone (FLONASE) 50 MCG/ACT nasal spray Place 1 spray into both nostrils daily.    Marland Kitchen lidocaine-prilocaine (EMLA) cream Apply to affected area once 30 g 3  . LORazepam (ATIVAN) 0.5 MG tablet Take 1 tablet (0.5 mg total) by mouth at bedtime. 30 tablet 0  . montelukast (SINGULAIR) 10 MG tablet TAKE 1 TABLET (10 MG TOTAL) BY MOUTH AT BEDTIME. 30 tablet 3  . ondansetron (ZOFRAN) 8 MG tablet Take 1 tablet (8 mg total) by mouth 2 (two) times daily as needed. Start on the third  day after chemotherapy. 30 tablet 1  . prochlorperazine (COMPAZINE) 10 MG tablet Take 1 tablet (10 mg total) by mouth every 6 (six) hours as needed (Nausea or vomiting). 30 tablet 1   No current facility-administered medications for this encounter.    Allergies  Allergen Reactions  . Lanolin Itching  . Latex Itching and Swelling      Social History   Social History  . Marital Status: Married    Spouse Name: N/A  . Number of Children: N/A  . Years of Education: N/A   Occupational History  . Not on file.   Social History Main Topics  . Smoking status: Never Smoker   . Smokeless tobacco: Never Used  . Alcohol Use: 0.0 oz/week    0 Standard drinks or equivalent per week     Comment: ocassional  . Drug Use: No  . Sexual Activity: Not  Currently     Comment: Was on BCP stopped 07/13/15   Other Topics Concern  . Not on file   Social History Narrative     No family history on file.  Filed Vitals:   08/05/15 1416  BP: 136/82  Pulse: 86  Weight: 135 lb 8 oz (61.462 kg)  SpO2: 100%    PHYSICAL EXAM: General:  Well appearing. No respiratory difficulty. Ambulated in the clinic without difficulty.  HEENT: normal Neck: supple. no JVD. Carotids 2+ bilat; no bruits. No lymphadenopathy or thryomegaly appreciated. Cor: PMI nondisplaced. Regular rate & rhythm. No rubs, gallops or murmurs.R upper chest scar ecchymotic from porta cath.  Lungs: clear Abdomen: soft, nontender, nondistended. No hepatosplenomegaly. No bruits or masses. Good bowel sounds. Extremities: no cyanosis, clubbing, rash, edema Neuro: alert & oriented x 3, cranial nerves grossly intact. moves all 4 extremities w/o difficulty. Affect pleasant.  ASSESSMENT & PLAN: 1. R Breast Cancer-Invasive ductal carcinoma grade 3, ER 0%, PR 0%, Ki-67 95%, HER-2 negative Receiving 4 cycles of adriamycin and cytoxan. Dr Haroldine Laws reviewed ECHO. I have discussed the results. Courtney Gomez understands Courtney Gomez at risk for chemo induced cardiomyopathy. Plan to follow up in 6 weeks with ECHO .   2. H/O Asthma.     Foye Haggart NP-C  2:13 PM

## 2015-08-05 NOTE — Telephone Encounter (Signed)
Called Courtney Gomez for chemotherapy F/U.  Patient is doing well.  C/o dizziness.  Denies n/v.  Denies any new side effects or symptoms.  Bowel and bladder is functioning well.  Eating and drinking well and I instructed to drink 64 oz minimum daily or at least the day before, of and after treatment.  Denies questions at this time and encouraged to call if needed.  Reviewed how to call after hours in the case of an emergency.   Patient was at at dr's appt and I was having trouble hearing her, then she was called back so I told her I would call her back in a bit.

## 2015-08-06 ENCOUNTER — Other Ambulatory Visit: Payer: Self-pay | Admitting: Nurse Practitioner

## 2015-08-07 ENCOUNTER — Other Ambulatory Visit: Payer: Self-pay | Admitting: General Surgery

## 2015-08-07 ENCOUNTER — Ambulatory Visit
Admission: RE | Admit: 2015-08-07 | Discharge: 2015-08-07 | Disposition: A | Discharge status: Home / Self Care | Payer: 59 | Source: Ambulatory Visit | Attending: General Surgery | Admitting: General Surgery

## 2015-08-07 ENCOUNTER — Ambulatory Visit
Admit: 2015-08-07 | Discharge: 2015-08-07 | Disposition: A | Discharge status: Home / Self Care | Payer: 59 | Attending: General Surgery | Admitting: General Surgery

## 2015-08-07 DIAGNOSIS — C50411 Malignant neoplasm of upper-outer quadrant of right female breast: Secondary | ICD-10-CM

## 2015-08-07 HISTORY — PX: BREAST BIOPSY: SHX20

## 2015-08-07 MED ORDER — GADOBENATE DIMEGLUMINE 529 MG/ML IV SOLN
12.0000 mL | Freq: Once | INTRAVENOUS | Status: AC | PRN
  Administered 2015-08-07: 12 mL via INTRAVENOUS

## 2015-08-08 ENCOUNTER — Encounter: Payer: Self-pay | Admitting: *Deleted

## 2015-08-09 ENCOUNTER — Other Ambulatory Visit (HOSPITAL_BASED_OUTPATIENT_CLINIC_OR_DEPARTMENT_OTHER): Payer: 59

## 2015-08-09 ENCOUNTER — Ambulatory Visit (HOSPITAL_BASED_OUTPATIENT_CLINIC_OR_DEPARTMENT_OTHER): Payer: 59 | Admitting: Hematology and Oncology

## 2015-08-09 ENCOUNTER — Encounter: Payer: Self-pay | Admitting: *Deleted

## 2015-08-09 ENCOUNTER — Encounter: Payer: Self-pay | Admitting: Hematology and Oncology

## 2015-08-09 VITALS — BP 144/84 | HR 106 | Temp 98.4°F | Resp 20 | Ht 66.0 in | Wt 135.3 lb

## 2015-08-09 DIAGNOSIS — C50411 Malignant neoplasm of upper-outer quadrant of right female breast: Secondary | ICD-10-CM | POA: Diagnosis not present

## 2015-08-09 LAB — COMPREHENSIVE METABOLIC PANEL
ALBUMIN: 3.7 g/dL (ref 3.5–5.0)
ALK PHOS: 61 U/L (ref 40–150)
ALT: 21 U/L (ref 0–55)
ANION GAP: 8 meq/L (ref 3–11)
AST: 14 U/L (ref 5–34)
BILIRUBIN TOTAL: 0.5 mg/dL (ref 0.20–1.20)
BUN: 11.1 mg/dL (ref 7.0–26.0)
CALCIUM: 9.4 mg/dL (ref 8.4–10.4)
CO2: 28 meq/L (ref 22–29)
CREATININE: 0.8 mg/dL (ref 0.6–1.1)
Chloride: 106 mEq/L (ref 98–109)
EGFR: 88 mL/min/{1.73_m2} — ABNORMAL LOW (ref >90)
Glucose: 106 mg/dl (ref 70–140)
Potassium: 3.9 mEq/L (ref 3.5–5.1)
Sodium: 142 mEq/L (ref 136–145)
TOTAL PROTEIN: 7 g/dL (ref 6.4–8.3)

## 2015-08-09 LAB — CBC WITH DIFFERENTIAL/PLATELET
BASO%: 0.5 % (ref 0.0–2.0)
Basophils Absolute: 0 10*3/uL (ref 0.0–0.1)
EOS ABS: 0.2 10*3/uL (ref 0.0–0.5)
EOS%: 4.8 % (ref 0.0–7.0)
HEMATOCRIT: 39.5 % (ref 34.8–46.6)
HEMOGLOBIN: 13.1 g/dL (ref 11.6–15.9)
LYMPH#: 1 10*3/uL (ref 0.9–3.3)
LYMPH%: 22.6 % (ref 14.0–49.7)
MCH: 32.5 pg (ref 25.1–34.0)
MCHC: 33.2 g/dL (ref 31.5–36.0)
MCV: 98 fL (ref 79.5–101.0)
MONO#: 0 10*3/uL — AB (ref 0.1–0.9)
MONO%: 0.2 % (ref 0.0–14.0)
NEUT%: 71.9 % (ref 38.4–76.8)
NEUTROS ABS: 3 10*3/uL (ref 1.5–6.5)
PLATELETS: 141 10*3/uL — AB (ref 145–400)
RBC: 4.03 10*6/uL (ref 3.70–5.45)
RDW: 11.9 % (ref 11.2–14.5)
WBC: 4.2 10*3/uL (ref 3.9–10.3)

## 2015-08-09 NOTE — Progress Notes (Signed)
Patient Care Team: Vania Rea, MD as PCP - General (Obstetrics and Gynecology)  DIAGNOSIS: Breast cancer of upper-outer quadrant of right female breast [facility])   Staging form: Breast, AJCC 7th Edition     Clinical stage from 07/24/2015: Stage IA (T1c, N0, M0) - Unsigned   SUMMARY OF ONCOLOGIC HISTORY:   Breast cancer of upper-outer quadrant of right female breast ([place])   07/11/2015 Mammogram Right breast mass in the axillary tail 1.7 x 1.5 x 1.3 cm axillary ultrasound negative, T1c N0 stage IA clinical stage   07/11/2015 Initial Diagnosis Right breast biopsy 10:00 position: Invasive ductal carcinoma grade 3, ER 0%, P of 0%, Ki-67 95%, HER-2 negative ratio 1.21; right breast biopsy 12:00: Fibrocystic changes   08/02/2015 -  Neo-Adjuvant Chemotherapy Dose dense Adriamycin and Cytoxan 4 followed by Abraxane weekly 12    CHIEF COMPLIANT: Cycle 1 day 8 dose dense Adriamycin and Cytoxan  INTERVAL HISTORY: Courtney Gomez is a 50 year old with above-mentioned history of right breast cancer currently on neoadjuvant chemotherapy with dose dense Adriamycin and Cytoxan. She is tolerated cycle one extremely well. She has had mild to moderate fatigue but otherwise denies any nausea vomiting.  REVIEW OF SYSTEMS:   Constitutional: Denies fevers, chills or abnormal weight loss Eyes: Denies blurriness of vision Ears, nose, mouth, throat, and face: Denies mucositis or sore throat Respiratory: Denies cough, dyspnea or wheezes Cardiovascular: Denies palpitation, chest discomfort Gastrointestinal:  Denies nausea, heartburn or change in bowel habits Skin: Denies abnormal skin rashes Lymphatics: Denies new lymphadenopathy or easy bruising Neurological:Denies numbness, tingling or new weaknesses Behavioral/Psych: Mood is stable, no new changes  Extremities: No lower extremity edema Breast:  denies any pain or lumps or nodules in either breasts All other systems were reviewed with the patient and are  negative.  I have reviewed the past medical history, past surgical history, social history and family history with the patient and they are unchanged from previous note.  ALLERGIES:  is allergic to lanolin and latex.  MEDICATIONS:  Current Outpatient Prescriptions  Medication Sig Dispense Refill  . ADVAIR DISKUS 250-50 MCG/DOSE AEPB Inhale 1 puff into the lungs daily.   5  . albuterol (PROAIR HFA) 108 (90 BASE) MCG/ACT inhaler Inhale 2 puffs into the lungs every 6 (six) hours as needed for wheezing or shortness of breath.    . cetirizine (ZYRTEC) 10 MG tablet Take 10 mg by mouth daily.    . fluticasone (FLONASE) 50 MCG/ACT nasal spray Place 1 spray into both nostrils daily.    Marland Kitchen lidocaine-prilocaine (EMLA) cream Apply to affected area once 30 g 3  . LORazepam (ATIVAN) 0.5 MG tablet Take 1 tablet (0.5 mg total) by mouth at bedtime. 30 tablet 0  . montelukast (SINGULAIR) 10 MG tablet TAKE 1 TABLET (10 MG TOTAL) BY MOUTH AT BEDTIME. 30 tablet 3  . ondansetron (ZOFRAN) 8 MG tablet Take 1 tablet (8 mg total) by mouth 2 (two) times daily as needed. Start on the third day after chemotherapy. 30 tablet 1  . prochlorperazine (COMPAZINE) 10 MG tablet Take 1 tablet (10 mg total) by mouth every 6 (six) hours as needed (Nausea or vomiting). 30 tablet 1   No current facility-administered medications for this visit.    PHYSICAL EXAMINATION: ECOG PERFORMANCE STATUS: 1 - Symptomatic but completely ambulatory  Filed Vitals:   08/09/15 0845  BP: 144/84  Pulse: 106  Temp: 98.4 F (36.9 C)  Resp: 20   Filed Weights   08/09/15 0845  Weight:  135 lb 4.8 oz (61.372 kg)    GENERAL:alert, no distress and comfortable SKIN: skin color, texture, turgor are normal, no rashes or significant lesions EYES: normal, Conjunctiva are pink and non-injected, sclera clear OROPHARYNX:no exudate, no erythema and lips, buccal mucosa, and tongue normal  NECK: supple, thyroid normal size, non-tender, without  nodularity LYMPH:  no palpable lymphadenopathy in the cervical, axillary or inguinal LUNGS: clear to auscultation and percussion with normal breathing effort HEART: regular rate & rhythm and no murmurs and no lower extremity edema ABDOMEN:abdomen soft, non-tender and normal bowel sounds MUSCULOSKELETAL:no cyanosis of digits and no clubbing  NEURO: alert & oriented x 3 with fluent speech, no focal motor/sensory deficits EXTREMITIES: No lower extremity edema  LABORATORY DATA:  I have reviewed the data as listed   Chemistry      Component Value Date/Time   NA 142 08/09/2015 0833   K 3.9 08/09/2015 0833   CO2 28 08/09/2015 0833   BUN 11.1 08/09/2015 0833   CREATININE 0.8 08/09/2015 0833      Component Value Date/Time   CALCIUM 9.4 08/09/2015 0833   ALKPHOS 61 08/09/2015 0833   AST 14 08/09/2015 0833   ALT 21 08/09/2015 0833   BILITOT 0.50 08/09/2015 0833       Lab Results  Component Value Date   WBC 4.2 08/09/2015   HGB 13.1 08/09/2015   HCT 39.5 08/09/2015   MCV 98.0 08/09/2015   PLT 141* 08/09/2015   NEUTROABS 3.0 08/09/2015     ASSESSMENT & PLAN:  Breast cancer of upper-outer quadrant of right female breast ([place]) Right mammogram and ultrasound 07/28/2015 Right breast mass in the axillary tail 1.7 x 1.5 x 1.3 cm axillary ultrasound negative, T1c N0 stage IA clinical stage Right breast biopsy 07/11/2015: 10:00 position: Invasive ductal carcinoma grade 3, ER 0%, PR 0%, Ki-67 95%, HER-2 negative ratio 1.21  Treatment plan based on multidisciplinary tumor board: 1. Neoadjuvant chemotherapy with Adriamycin and Cytoxan dose dense 4 followed by Abraxane weekly 12 2. Followed by breast conserving surgery with sentinel lymph node study 3. Followed by adjuvant radiation therapy ------------------------------------------------------------------------------------------------------------------------------ Current treatment: Today cycle 1 day 1 of dose dense Adriamycin and  Cytoxan Lab work was reviewed Echocardiogram 08/01/2015: EF 65-70% Breast MRI: 07/30/2015: right breast mass 2.4 x 2.1 x 2.1 cm with a preserved fat plane between the mass in the chest wall, 5 mm enhancing mass in the right breast at 12:00 C,biopsy-proven fibroadenoma.  Chemotherapy toxicities: No major side effects to chemotherapy. Denies any nausea or vomiting.  Return to clinic in 1 week for cycle 2  No orders of the defined types were placed in this encounter.   The patient has a good understanding of the overall plan. she agrees with it. she will call with any problems that may develop before the next visit here.   Rulon Eisenmenger, MD 08/09/2015

## 2015-08-09 NOTE — Assessment & Plan Note (Signed)
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 Neoadjuvant chemotherapy with Adriamycin and Cytoxan dose dense 4 followed by Abraxane weekly 12 2. Followed by breast conserving surgery with sentinel lymph node study 3. Followed by adjuvant radiation therapy ------------------------------------------------------------------------------------------------------------------------------ Current treatment: Today cycle 1 day 1 of dose dense Adriamycin and Cytoxan Lab work was reviewed Echocardiogram 08/01/2015: EF 65-70% Breast MRI: 07/30/2015: right breast mass 2.4 x 2.1 x 2.1 cm with a preserved fat plane between the mass in the chest wall, 5 mm enhancing mass in the right breast at 12:00 C,biopsy-proven fibroadenoma.  Chemotherapy toxicities:   Return to clinic in 1 week for cycle 2

## 2015-08-15 ENCOUNTER — Encounter: Payer: Self-pay | Admitting: Genetic Counselor

## 2015-08-15 ENCOUNTER — Other Ambulatory Visit: Payer: Self-pay | Admitting: *Deleted

## 2015-08-15 ENCOUNTER — Other Ambulatory Visit (HOSPITAL_BASED_OUTPATIENT_CLINIC_OR_DEPARTMENT_OTHER): Payer: 59

## 2015-08-15 ENCOUNTER — Ambulatory Visit (HOSPITAL_BASED_OUTPATIENT_CLINIC_OR_DEPARTMENT_OTHER): Payer: 59 | Admitting: Genetic Counselor

## 2015-08-15 ENCOUNTER — Other Ambulatory Visit: Payer: Self-pay

## 2015-08-15 DIAGNOSIS — C50411 Malignant neoplasm of upper-outer quadrant of right female breast: Secondary | ICD-10-CM

## 2015-08-15 DIAGNOSIS — Z8371 Family history of colonic polyps: Secondary | ICD-10-CM

## 2015-08-15 DIAGNOSIS — C50919 Malignant neoplasm of unspecified site of unspecified female breast: Secondary | ICD-10-CM

## 2015-08-15 DIAGNOSIS — Z801 Family history of malignant neoplasm of trachea, bronchus and lung: Secondary | ICD-10-CM

## 2015-08-15 DIAGNOSIS — Z800 Family history of malignant neoplasm of digestive organs: Secondary | ICD-10-CM

## 2015-08-15 DIAGNOSIS — Z8042 Family history of malignant neoplasm of prostate: Secondary | ICD-10-CM

## 2015-08-15 LAB — CBC WITH DIFFERENTIAL/PLATELET
BASO%: 0.7 % (ref 0.0–2.0)
BASOS ABS: 0 10*3/uL (ref 0.0–0.1)
EOS ABS: 0 10*3/uL (ref 0.0–0.5)
EOS%: 2.7 % (ref 0.0–7.0)
HEMATOCRIT: 38.6 % (ref 34.8–46.6)
HEMOGLOBIN: 12.7 g/dL (ref 11.6–15.9)
LYMPH#: 1.2 10*3/uL (ref 0.9–3.3)
LYMPH%: 66.8 % — ABNORMAL HIGH (ref 14.0–49.7)
MCH: 32 pg (ref 25.1–34.0)
MCHC: 33 g/dL (ref 31.5–36.0)
MCV: 97.1 fL (ref 79.5–101.0)
MONO#: 0.4 10*3/uL (ref 0.1–0.9)
MONO%: 22.4 % — ABNORMAL HIGH (ref 0.0–14.0)
NEUT#: 0.1 10*3/uL — CL (ref 1.5–6.5)
NEUT%: 7.4 % — AB (ref 38.4–76.8)
Platelets: 184 10*3/uL (ref 145–400)
RBC: 3.97 10*6/uL (ref 3.70–5.45)
RDW: 12.2 % (ref 11.2–14.5)
WBC: 1.8 10*3/uL — ABNORMAL LOW (ref 3.9–10.3)

## 2015-08-15 LAB — COMPREHENSIVE METABOLIC PANEL
ALBUMIN: 4 g/dL (ref 3.5–5.0)
ALK PHOS: 63 U/L (ref 40–150)
ALT: 20 U/L (ref 0–55)
ANION GAP: 6 meq/L (ref 3–11)
AST: 13 U/L (ref 5–34)
BUN: 10.2 mg/dL (ref 7.0–26.0)
CALCIUM: 9.8 mg/dL (ref 8.4–10.4)
CHLORIDE: 107 meq/L (ref 98–109)
CO2: 30 mEq/L — ABNORMAL HIGH (ref 22–29)
Creatinine: 0.8 mg/dL (ref 0.6–1.1)
Glucose: 133 mg/dl (ref 70–140)
POTASSIUM: 4.8 meq/L (ref 3.5–5.1)
Sodium: 143 mEq/L (ref 136–145)
Total Bilirubin: <0.3 mg/dL (ref 0.20–1.20)
Total Protein: 7.3 g/dL (ref 6.4–8.3)

## 2015-08-15 LAB — TECHNOLOGIST REVIEW

## 2015-08-15 MED ORDER — MAGIC MOUTHWASH W/LIDOCAINE: ORAL | Status: DC

## 2015-08-15 NOTE — Progress Notes (Signed)
Received panic ANC 0.1 at 11:58.  Lindi Adie, MD notified at 12:12.  Pt to come in tomorrow for Neupogen injection in place of chemo infusion d/t neutropenia.  Called and spoke with pt regarding her counts and explained neutropenia and proper neutropenic precautions.  Discussed that chemo could not be done tomorrow unfortunately as it would be unsafe.  Pt disappointed but understanding.  Reviewed appts with pt to come in as scheduled to see Dr. Lindi Adie and then have injection following.  Pt verbalized understanding and without complaints at this time.  Pt will pick up masks from [place] tomorrow as she has a large party planned this weekend.  After discussing neutropenic precautions, pt states she has a large party planned for this weekend.  I instructed her I recommended her to wear a mask and use diligent hand washing for this and she could discuss further with Dr. Lindi Adie tomorrow at appt.  I informed her we had masks available she could pick up tomorrow while at our facility.  Pt verbalized understanding of all information and pt without complaints or concerns at time of call.  POF and Neupogen orders entered for tomorrow.

## 2015-08-15 NOTE — Progress Notes (Signed)
REFERRING PROVIDER: Nicholas Lose, MD  OTHER PROVIDER(S): WAKEFIELD, MATTHEW, MD Thea Silversmith, MD  PRIMARY PROVIDER:  Paulo Fruit, MD  PRIMARY REASON FOR VISIT:  1. Breast cancer of upper-outer quadrant of right female breast ([place])   2. 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 Family history of colon cancer   4. Family history- stomach cancer   5. Family history of prostate cancer in father   68. Family history of lung cancer   7. Family history of colonic polyps      HISTORY OF PRESENT ILLNESS:   Courtney Gomez, a 49 y.o. female, was seen for a [place] cancer genetics consultation at the request of Dr. Lindi Adie due to a personal history of triple negative breast cancer at age 31.  Courtney Gomez presents to clinic today with her mother to discuss the possibility of a hereditary predisposition to cancer, genetic testing, and to further clarify her future cancer risks, as well as potential cancer risks for family members.   In April 2017, at the age of 24, Courtney Gomez was diagnosed with invasive ductal carcinoma of the right breast.  Hormone receptor status was triple negative.  This is being treated with neoadjuvant chemotherapy and surgery will follow.  Genetic testing will help inform surgical and treatment decisions.   CANCER HISTORY:    Breast cancer of upper-outer quadrant of right female breast ([place])   07/11/2015 Mammogram Right breast mass in the axillary tail 1.7 x 1.5 x 1.3 cm axillary ultrasound negative, T1c N0 stage IA clinical stage   07/11/2015 Initial Diagnosis Right breast biopsy 10:00 position: Invasive ductal carcinoma grade 3, ER 0%, P of 0%, Ki-67 95%, HER-2 negative ratio 1.21; right breast biopsy 12:00: Fibrocystic changes   08/02/2015 -  Neo-Adjuvant Chemotherapy Dose dense Adriamycin and Cytoxan 4 followed by Abraxane weekly 12     HORMONAL RISK FACTORS:  Menarche was at age 27.5 years.  First live birth at age 89.  OCP use for approximately  25 years.  Ovaries intact: yes.  Hysterectomy: no.  Menopausal status: premenopausal.  HRT use: 0 years. Colonoscopy: no, but had a normal stool sample result in September 2016; not examined. Mammogram within the last year: yes. Number of breast biopsies: 2. Up to date with pelvic exams:  yes. Any excessive radiation exposure in the past:  no, but does report some history of secondhand smoke exposure  Past Medical History  Diagnosis Date  . Asthma   . Breast cancer of upper-outer quadrant of right female breast ([place]) 07/15/2015  . History of motion sickness   . Eczema   . Anxiety     not currently    Past Surgical History  Procedure Laterality Date  . Dental surgery  1982    impacted teeth  . Portacath placement Right 07/31/2015    Procedure: INSERTION PORT-A-CATH WITH ULTRASOUND ;  Surgeon: Rolm Bookbinder, MD;  Location: [facility];  Service: General;  Laterality: Right;    Social History   Social History  . Marital Status: Married    Spouse Name: N/A  . Number of Children: N/A  . Years of Education: N/A   Social History Main Topics  . Smoking status: Never Smoker   . Smokeless tobacco: Never Used  . Alcohol Use: 0.0 oz/week    0 Standard drinks or equivalent per week     Comment: ocassional  . Drug Use: No  . Sexual Activity: Not Currently     Comment: Was on BCP  stopped 07/13/15   Other Topics Concern  . Not on file   Social History Narrative     FAMILY HISTORY:  We obtained a detailed, 4-generation family history.  Significant diagnoses are listed below: Family History  Problem Relation Age of Onset  . Colon polyps Mother     hx of one large colon polyp s/p resection  . Other Mother     hx of benign teratoma and molar pregnancy  . Prostate cancer Father 38  . Colon polyps Father     possible colon polyp hx; +diverticulitis  . Diverticulitis Father   . Other Maternal Uncle     hx of unspecified "lump on his back"  . Lupus Paternal Aunt   . Heart attack  Paternal Uncle     d. 62s; smoker  . Heart Problems Paternal Uncle   . Colon cancer Maternal Grandmother 63    possible colon cancer  . Lung cancer Maternal Grandfather 38    smoker; +EtOH  . COPD Paternal Grandmother   . Heart Problems Paternal Grandmother   . Heart Problems Paternal Grandfather   . Prostate cancer Paternal Grandfather     possible prostate cancer dx. early 81s  . Stomach cancer Other     maternal great grandmother (MGF's mother); dx. late 28s  . Colon cancer Other 85    paternal great grandmother (PGM's mother)  . Stomach cancer Other     paternal great uncles (PGF's brother) d. late 50s-early 15s    Courtney Gomez has two sons, ages 93 and 73.  Her mother is currently 57 and has never had cancer.  Her mother has a history of a benign teratoma/molar pregnancy and also has a history of a large colon polyp that was surgically resected.  Courtney Gomez father is currently 51 and has recently been diagnosed with prostate cancer.  He also has a history of diverticulitis and may have a history of colon polyps.    Courtney Gomez mother has one full brother who has passed away from an unknown cause, but likely was linked to his history of smoking and alcohol abuse.  Courtney Gomez also reports that this uncle has a "lump on his back", but they are not sure what this lump was.  He has one son for whom Courtney Gomez and her mother had limited information.  Courtney Gomez maternal grandmother died of cardiac arrest at age 39.  Courtney Gomez mother believes that she might also have had colon cancer at the time.  Courtney Gomez maternal grandfather was a smoker and died of lung cancer at 12.  Courtney Gomez and her mother were unaware of any cancer history for any maternal great aunts/uncles, but did report that her maternal great grandmother (grandfather's mother) died of stomach cancer, perhaps in her late 9s.    Courtney Gomez father had two full brothers and one full sister.  One brother is currently in his  early 42s and has never had cancer.  Another brother died of a heart attack in his 30s.  The sister likely died of a drug overdose at the age of 76.  Courtney Gomez is unaware of a history of cancer for any paternal first cousins.  Her paternal grandmother died of heart problems and COPD in her 48s.  Her grandmother's mother died of colon cancer at age 38.  Courtney Gomez paternal grandfather died of heart issues in his early 72s.  Courtney Gomez mother believes that he also had prostate cancer around this  time.  He had two brothers and one sister, and one of his brothers died of stomach cancer in his late 40s-early 60s.  Courtney Gomez is unaware of any previous family history of genetic testing for hereditary cancer.  Patient's maternal ancestors are of [place], [place], [place], [place], and [place] descent, and paternal ancestors are of Western European/[place] and English descent. There is no reported Ashkenazi Jewish ancestry. There is no known consanguinity.  GENETIC COUNSELING ASSESSMENT: Courtney Gomez is a 50 y.o. female with a personal and family history of cancer which is somewhat suggestive of a hereditary cancer syndrome and predisposition to cancer. We, therefore, discussed and recommended the following at today's visit.   DISCUSSION: We reviewed the characteristics, features and inheritance patterns of hereditary cancer syndromes, particularly those caused by mutation within the BRCA1/2, CHEK2, and PALB2 genes. We also discussed genetic testing, including the appropriate family members to test, the process of testing, insurance coverage and turn-around-time for results. We discussed the implications of a negative, positive and/or variant of uncertain significant result. We recommended Ms. Banner pursue genetic testing for the 20-gene Breast/Ovarian Cancer Panel through [facility].  The Breast/Ovarian Cancer Panel offered by [facility] Hope Pigeon, MD) includes sequencing and  deletion/duplication analysis for the following 19 genes:  ATM, BARD1, BRCA1, BRCA2, BRIP1, CDH1, CHEK2, FANCC, MLH1, MSH2, MSH6, NBN, PALB2, PMS2, PTEN, RAD51C, RAD51D, TP53, and XRCC2.  This panel also includes deletion/duplication analysis (without sequencing) for one gene, EPCAM.  Based on Ms. Hepp personal history of cancer, she meets medical criteria for genetic testing. Despite that she meets criteria, she may still have an out of pocket cost. We discussed that if her out of pocket cost for testing is over $100, the laboratory will call and confirm whether she wants to proceed with testing.  If the out of pocket cost of testing is less than $100 she will be billed by the genetic testing laboratory.   PLAN: After considering the risks, benefits, and limitations, Ms. Smay  provided informed consent to pursue genetic testing and the blood sample was sent to [facility] for analysis of the 20-gene Breast/Ovarian Cancer Panel. Results should be available within approximately 2-3 weeks' time, at which point they will be disclosed by telephone to Ms. Stansel, as will any additional recommendations warranted by these results. Ms. Shaffer will receive a summary of her genetic counseling visit and a copy of her results once available. This information will also be available in Epic. We encouraged Ms. Gunderson to remain in contact with cancer genetics annually so that we can continuously update the family history and inform her of any changes in cancer genetics and testing that may be of benefit for her family. Ms. Shoults questions were answered to her satisfaction today. Our contact information was provided should additional questions or concerns arise.  Thank you for the referral and allowing [place] to share in the care of your patient.   Jeanine Luz, MS, [facility] Certified Genetic Counselor [email or web address] phone: [phone]  The patient was seen for a total of 60 minutes in face-to-face  genetic counseling.  This patient was discussed with Drs. Magrinat, Lindi Adie and/or Burr Medico who agrees with the above.    _______________________________________________________________________ For Office Staff:  Number of people involved in session: 2 Was an Intern/ student involved with case: no

## 2015-08-16 ENCOUNTER — Other Ambulatory Visit: Payer: Self-pay

## 2015-08-16 ENCOUNTER — Encounter: Payer: Self-pay | Admitting: Hematology and Oncology

## 2015-08-16 ENCOUNTER — Ambulatory Visit: Payer: 59

## 2015-08-16 ENCOUNTER — Other Ambulatory Visit: Payer: 59

## 2015-08-16 ENCOUNTER — Ambulatory Visit (HOSPITAL_BASED_OUTPATIENT_CLINIC_OR_DEPARTMENT_OTHER): Payer: 59 | Admitting: Hematology and Oncology

## 2015-08-16 ENCOUNTER — Telehealth: Payer: Self-pay | Admitting: Hematology and Oncology

## 2015-08-16 ENCOUNTER — Ambulatory Visit (HOSPITAL_BASED_OUTPATIENT_CLINIC_OR_DEPARTMENT_OTHER): Payer: 59

## 2015-08-16 VITALS — BP 128/73 | HR 97 | Temp 98.3°F | Resp 18

## 2015-08-16 VITALS — BP 156/80 | HR 80 | Temp 98.5°F | Resp 18 | Ht 66.0 in | Wt 135.0 lb

## 2015-08-16 DIAGNOSIS — D701 Agranulocytosis secondary to cancer chemotherapy: Secondary | ICD-10-CM | POA: Diagnosis not present

## 2015-08-16 DIAGNOSIS — C50411 Malignant neoplasm of upper-outer quadrant of right female breast: Secondary | ICD-10-CM

## 2015-08-16 MED ORDER — TBO-FILGRASTIM 480 MCG/0.8ML ~~LOC~~ SOSY
480.0000 ug | PREFILLED_SYRINGE | Freq: Once | SUBCUTANEOUS | Status: AC
  Administered 2015-08-16: 480 ug via SUBCUTANEOUS
  Filled 2015-08-16: qty 0.8

## 2015-08-16 MED ORDER — CEPHALEXIN 500 MG PO CAPS: 500.0000 mg | ORAL_CAPSULE | Freq: Two times a day (BID) | ORAL | Status: DC

## 2015-08-16 NOTE — Telephone Encounter (Signed)
appt made and avs printed °

## 2015-08-16 NOTE — Progress Notes (Signed)
Patient Care Team: Vania Rea, MD as PCP - General (Obstetrics and Gynecology)  DIAGNOSIS: Breast cancer of upper-outer quadrant of right female breast [facility])   Staging form: Breast, AJCC 7th Edition     Clinical stage from 07/24/2015: Stage IA (T1c, N0, M0) - Unsigned   SUMMARY OF ONCOLOGIC HISTORY:   Breast cancer of upper-outer quadrant of right female breast (Courtney Gomez)   07/11/2015 Mammogram Right breast mass in the axillary tail 1.7 x 1.5 x 1.3 cm axillary ultrasound negative, T1c N0 stage IA clinical stage   07/11/2015 Initial Diagnosis Right breast biopsy 10:00 position: Invasive ductal carcinoma grade 3, ER 0%, P of 0%, Ki-67 95%, HER-2 negative ratio 1.21; right breast biopsy 12:00: Fibrocystic changes   08/02/2015 -  Neo-Adjuvant Chemotherapy Dose dense Adriamycin and Cytoxan 4 followed by Abraxane weekly 12    CHIEF COMPLIANT: Cycle 2 dose dense Adriamycin and Cytoxan, being held for neutropenia  INTERVAL HISTORY: Courtney Gomez is a 50 year old with above-mentioned history of right breast cancer currently on neoadjuvant chemotherapy today cycle 2 of dose dense Adriamycin Cytoxan but because her ANC was 0.1 yesterday we are holding her chemotherapy treatment today. Her next chemotherapy will be scheduled for following Friday. Patient has not had any side effects to chemotherapy other than this neutropenia. She denies any fevers and chills.  REVIEW OF SYSTEMS:   Constitutional: Denies fevers, chills or abnormal weight loss Eyes: Denies blurriness of vision Ears, nose, mouth, throat, and face: Denies mucositis or sore throat Respiratory: Denies cough, dyspnea or wheezes Cardiovascular: Denies palpitation, chest discomfort Gastrointestinal:  Denies nausea, heartburn or change in bowel habits Skin: Denies abnormal skin rashes Lymphatics: Denies new lymphadenopathy or easy bruising Neurological:Denies numbness, tingling or new weaknesses Behavioral/Psych: Mood is stable, no new  changes  Extremities: No lower extremity edema Breast: Patient reports of the cancer that was palpable before has nearly resolved to palpation All other systems were reviewed with the patient and are negative.  I have reviewed the past medical history, past surgical history, social history and family history with the patient and they are unchanged from previous note.  ALLERGIES:  is allergic to lanolin and latex.  MEDICATIONS:  Current Outpatient Prescriptions  Medication Sig Dispense Refill  . ADVAIR DISKUS 250-50 MCG/DOSE AEPB Inhale 1 puff into the lungs daily.   5  . albuterol (PROAIR HFA) 108 (90 BASE) MCG/ACT inhaler Inhale 2 puffs into the lungs every 6 (six) hours as needed for wheezing or shortness of breath.    . cephALEXin (KEFLEX) 500 MG capsule Take 1 capsule (500 mg total) by mouth 2 (two) times daily. 14 capsule 0  . cetirizine (ZYRTEC) 10 MG tablet Take 10 mg by mouth daily.    . fluticasone (FLONASE) 50 MCG/ACT nasal spray Place 1 spray into both nostrils daily.    Marland Kitchen lidocaine-prilocaine (EMLA) cream Apply to affected area once 30 g 3  . LORazepam (ATIVAN) 0.5 MG tablet Take 1 tablet (0.5 mg total) by mouth at bedtime. 30 tablet 0  . magic mouthwash w/lidocaine SOLN Take 80ms by mouth four times a day as needed. Swish and spit 240 mL 1  . montelukast (SINGULAIR) 10 MG tablet TAKE 1 TABLET (10 MG TOTAL) BY MOUTH AT BEDTIME. 30 tablet 3  . ondansetron (ZOFRAN) 8 MG tablet Take 1 tablet (8 mg total) by mouth 2 (two) times daily as needed. Start on the third day after chemotherapy. 30 tablet 1  . prochlorperazine (COMPAZINE) 10 MG tablet Take 1  tablet (10 mg total) by mouth every 6 (six) hours as needed (Nausea or vomiting). 30 tablet 1   No current facility-administered medications for this visit.    PHYSICAL EXAMINATION: ECOG PERFORMANCE STATUS: 0 - Asymptomatic  Filed Vitals:   08/16/15 1128  BP: 156/80  Pulse: 80  Temp: 98.5 F (36.9 C)  Resp: 18   Filed Weights    08/16/15 1128  Weight: 135 lb (61.236 kg)    GENERAL:alert, no distress and comfortable SKIN: skin color, texture, turgor are normal, no rashes or significant lesions EYES: normal, Conjunctiva are pink and non-injected, sclera clear OROPHARYNX:no exudate, no erythema and lips, buccal mucosa, and tongue normal  NECK: supple, thyroid normal size, non-tender, without nodularity LYMPH:  no palpable lymphadenopathy in the cervical, axillary or inguinal LUNGS: clear to auscultation and percussion with normal breathing effort HEART: regular rate & rhythm and no murmurs and no lower extremity edema ABDOMEN:abdomen soft, non-tender and normal bowel sounds MUSCULOSKELETAL:no cyanosis of digits and no clubbing  NEURO: alert & oriented x 3 with fluent speech, no focal motor/sensory deficits EXTREMITIES: No lower extremity edema  LABORATORY DATA:  I have reviewed the data as listed   Chemistry      Component Value Date/Time   NA 143 08/15/2015 1143   K 4.8 08/15/2015 1143   CO2 30* 08/15/2015 1143   BUN 10.2 08/15/2015 1143   CREATININE 0.8 08/15/2015 1143      Component Value Date/Time   CALCIUM 9.8 08/15/2015 1143   ALKPHOS 63 08/15/2015 1143   AST 13 08/15/2015 1143   ALT 20 08/15/2015 1143   BILITOT <0.30 08/15/2015 1143       Lab Results  Component Value Date   WBC 1.8* 08/15/2015   HGB 12.7 08/15/2015   HCT 38.6 08/15/2015   MCV 97.1 08/15/2015   PLT 184 08/15/2015   NEUTROABS 0.1* 08/15/2015     ASSESSMENT & PLAN:  Breast cancer of upper-outer quadrant of right female breast ([place]) Right mammogram and ultrasound 07/28/2015 Right breast mass in the axillary tail 1.7 x 1.5 x 1.3 cm axillary ultrasound negative, T1c N0 stage IA clinical stage Right breast biopsy 07/11/2015: 10:00 position: Invasive ductal carcinoma grade 3, ER 0%, PR 0%, Ki-67 95%, HER-2 negative ratio 1.21  Treatment plan based on multidisciplinary tumor board: 1. Neoadjuvant chemotherapy with  Adriamycin and Cytoxan dose dense 4 followed by Abraxane weekly 12 2. Followed by breast conserving surgery with sentinel lymph node study 3. Followed by adjuvant radiation therapy ------------------------------------------------------------------------------------------------------------------------------ Current treatment: Today cycle 2 day 1 of dose dense Adriamycin and Cytoxan Lab work was reviewed Echocardiogram 08/01/2015: EF 65-70% Breast MRI: 07/30/2015: right breast mass 2.4 x 2.1 x 2.1 cm with a preserved fat plane between the mass in the chest wall, 5 mm enhancing mass in the right breast at 12:00 C,biopsy-proven fibroadenoma.  Chemotherapy toxicities: 1. Neutropenia: I suspect that the Neulasta injection did not work. She will receive Neupogen injection today and tomorrow. From the next cycle I plan to use Neulasta injection instead of Onpro because I believe that her current neutropenia is related to malfunctioning of onpro.  Patient is disappointed that she could not receive her treatment today.  I provided her instructions on neutropenia and neutropenic precautions.  She had a skin and edema on her chin from scratching a pimple. I am worried about risk of infection and I prescribed her Keflex for 1 week.   No other major side effects to chemotherapy. Denies any nausea or  vomiting.  Return to clinic in 1 week for cycle 2   No orders of the defined types were placed in this encounter.   The patient has a good understanding of the overall plan. she agrees with it. she will call with any problems that may develop before the next visit here.   Rulon Eisenmenger, MD 08/16/2015

## 2015-08-16 NOTE — Assessment & Plan Note (Signed)
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 Neoadjuvant chemotherapy with Adriamycin and Cytoxan dose dense 4 followed by Abraxane weekly 12 2. Followed by breast conserving surgery with sentinel lymph node study 3. Followed by adjuvant radiation therapy ------------------------------------------------------------------------------------------------------------------------------ Current treatment: Today cycle 2 day 1 of dose dense Adriamycin and Cytoxan Lab work was reviewed Echocardiogram 08/01/2015: EF 65-70% Breast MRI: 07/30/2015: right breast mass 2.4 x 2.1 x 2.1 cm with a preserved fat plane between the mass in the chest wall, 5 mm enhancing mass in the right breast at 12:00 C,biopsy-proven fibroadenoma.  Chemotherapy toxicities: 1. Neutropenia: I suspect that the Neulasta injection did not work. She will receive Neupogen injection today. No major side effects to chemotherapy. Denies any nausea or vomiting.  Return to clinic in 1 week for cycle 2

## 2015-08-17 ENCOUNTER — Ambulatory Visit (HOSPITAL_BASED_OUTPATIENT_CLINIC_OR_DEPARTMENT_OTHER): Payer: 59

## 2015-08-17 VITALS — BP 126/86 | HR 112 | Temp 99.7°F | Resp 16

## 2015-08-17 DIAGNOSIS — C50411 Malignant neoplasm of upper-outer quadrant of right female breast: Secondary | ICD-10-CM

## 2015-08-17 DIAGNOSIS — D701 Agranulocytosis secondary to cancer chemotherapy: Secondary | ICD-10-CM

## 2015-08-17 MED ORDER — TBO-FILGRASTIM 480 MCG/0.8ML ~~LOC~~ SOSY
480.0000 ug | PREFILLED_SYRINGE | Freq: Once | SUBCUTANEOUS | Status: AC
  Administered 2015-08-17: 480 ug via SUBCUTANEOUS

## 2015-08-17 NOTE — Patient Instructions (Signed)

## 2015-08-20 ENCOUNTER — Other Ambulatory Visit: Payer: Self-pay | Admitting: Hematology and Oncology

## 2015-08-23 ENCOUNTER — Other Ambulatory Visit (HOSPITAL_BASED_OUTPATIENT_CLINIC_OR_DEPARTMENT_OTHER): Payer: 59

## 2015-08-23 ENCOUNTER — Encounter: Payer: Self-pay | Admitting: *Deleted

## 2015-08-23 ENCOUNTER — Ambulatory Visit (HOSPITAL_BASED_OUTPATIENT_CLINIC_OR_DEPARTMENT_OTHER): Payer: 59

## 2015-08-23 ENCOUNTER — Encounter: Payer: Self-pay | Admitting: Nurse Practitioner

## 2015-08-23 ENCOUNTER — Ambulatory Visit (HOSPITAL_BASED_OUTPATIENT_CLINIC_OR_DEPARTMENT_OTHER): Payer: 59 | Admitting: Nurse Practitioner

## 2015-08-23 ENCOUNTER — Telehealth: Payer: Self-pay | Admitting: Nurse Practitioner

## 2015-08-23 VITALS — BP 138/86 | HR 91 | Temp 97.9°F | Resp 18 | Wt 135.3 lb

## 2015-08-23 DIAGNOSIS — C50411 Malignant neoplasm of upper-outer quadrant of right female breast: Secondary | ICD-10-CM

## 2015-08-23 DIAGNOSIS — Z5111 Encounter for antineoplastic chemotherapy: Secondary | ICD-10-CM | POA: Diagnosis not present

## 2015-08-23 DIAGNOSIS — D701 Agranulocytosis secondary to cancer chemotherapy: Secondary | ICD-10-CM

## 2015-08-23 LAB — CBC WITH DIFFERENTIAL/PLATELET
BASO%: 0.5 % (ref 0.0–2.0)
Basophils Absolute: 0 10*3/uL (ref 0.0–0.1)
EOS%: 0.4 % (ref 0.0–7.0)
Eosinophils Absolute: 0 10*3/uL (ref 0.0–0.5)
HEMATOCRIT: 39.5 % (ref 34.8–46.6)
HEMOGLOBIN: 13.2 g/dL (ref 11.6–15.9)
LYMPH#: 1.9 10*3/uL (ref 0.9–3.3)
LYMPH%: 33.5 % (ref 14.0–49.7)
MCH: 32.1 pg (ref 25.1–34.0)
MCHC: 33.4 g/dL (ref 31.5–36.0)
MCV: 96.1 fL (ref 79.5–101.0)
MONO#: 0.5 10*3/uL (ref 0.1–0.9)
MONO%: 9.1 % (ref 0.0–14.0)
NEUT%: 56.5 % (ref 38.4–76.8)
NEUTROS ABS: 3.2 10*3/uL (ref 1.5–6.5)
PLATELETS: 248 10*3/uL (ref 145–400)
RBC: 4.11 10*6/uL (ref 3.70–5.45)
RDW: 12.6 % (ref 11.2–14.5)
WBC: 5.7 10*3/uL (ref 3.9–10.3)

## 2015-08-23 LAB — COMPREHENSIVE METABOLIC PANEL
ALBUMIN: 4 g/dL (ref 3.5–5.0)
ALK PHOS: 64 U/L (ref 40–150)
ALT: 55 U/L (ref 0–55)
AST: 38 U/L — AB (ref 5–34)
Anion Gap: 11 mEq/L (ref 3–11)
BUN: 13.3 mg/dL (ref 7.0–26.0)
CO2: 27 meq/L (ref 22–29)
Calcium: 9.8 mg/dL (ref 8.4–10.4)
Chloride: 106 mEq/L (ref 98–109)
Creatinine: 0.8 mg/dL (ref 0.6–1.1)
EGFR: 82 mL/min/{1.73_m2} — AB (ref >90)
GLUCOSE: 68 mg/dL — AB (ref 70–140)
POTASSIUM: 3.8 meq/L (ref 3.5–5.1)
Sodium: 144 mEq/L (ref 136–145)
Total Bilirubin: 0.31 mg/dL (ref 0.20–1.20)
Total Protein: 7.6 g/dL (ref 6.4–8.3)

## 2015-08-23 MED ORDER — DOXORUBICIN HCL CHEMO IV INJECTION 2 MG/ML
59.0000 mg/m2 | Freq: Once | INTRAVENOUS | Status: AC
  Administered 2015-08-23: 100 mg via INTRAVENOUS
  Filled 2015-08-23: qty 50

## 2015-08-23 MED ORDER — SODIUM CHLORIDE 0.9% FLUSH
10.0000 mL | INTRAVENOUS | Status: DC | PRN
  Administered 2015-08-23: 10 mL
  Filled 2015-08-23: qty 10

## 2015-08-23 MED ORDER — HEPARIN SOD (PORK) LOCK FLUSH 100 UNIT/ML IV SOLN
500.0000 [IU] | Freq: Once | INTRAVENOUS | Status: AC | PRN
  Administered 2015-08-23: 500 [IU]
  Filled 2015-08-23: qty 5

## 2015-08-23 MED ORDER — SODIUM CHLORIDE 0.9 % IV SOLN
Freq: Once | INTRAVENOUS | Status: AC
  Administered 2015-08-23: 10:00:00 via INTRAVENOUS
  Filled 2015-08-23: qty 5

## 2015-08-23 MED ORDER — PALONOSETRON HCL INJECTION 0.25 MG/5ML
0.2500 mg | Freq: Once | INTRAVENOUS | Status: AC
  Administered 2015-08-23: 0.25 mg via INTRAVENOUS

## 2015-08-23 MED ORDER — SODIUM CHLORIDE 0.9 % IV SOLN
Freq: Once | INTRAVENOUS | Status: AC
  Administered 2015-08-23: 10:00:00 via INTRAVENOUS

## 2015-08-23 MED ORDER — SODIUM CHLORIDE 0.9 % IV SOLN
600.0000 mg/m2 | Freq: Once | INTRAVENOUS | Status: AC
  Administered 2015-08-23: 1020 mg via INTRAVENOUS
  Filled 2015-08-23: qty 51

## 2015-08-23 MED ORDER — PALONOSETRON HCL INJECTION 0.25 MG/5ML
INTRAVENOUS | Status: AC
  Filled 2015-08-23: qty 5

## 2015-08-23 NOTE — Patient Instructions (Signed)
Doxorubicin injection  What is this medicine?  DOXORUBICIN (dox oh ROO bi sin) is a chemotherapy drug. It is used to treat many kinds of cancer like Hodgkin's disease, leukemia, non-Hodgkin's lymphoma, neuroblastoma, sarcoma, and Wilms' tumor. It is also used to treat bladder cancer, breast cancer, lung cancer, ovarian cancer, stomach cancer, and thyroid cancer.  This medicine may be used for other purposes; ask your health care provider or pharmacist if you have questions.  What should I tell my health care provider before I take this medicine?  They need to know if you have any of these conditions:  -blood disorders  -heart disease, recent heart attack  -infection (especially a virus infection such as chickenpox, cold sores, or herpes)  -irregular heartbeat  -liver disease  -recent or ongoing radiation therapy  -an unusual or allergic reaction to doxorubicin, other chemotherapy agents, other medicines, foods, dyes, or preservatives  -pregnant or trying to get pregnant  -breast-feeding  How should I use this medicine?  This drug is given as an infusion into a vein. It is administered in a hospital or clinic by a specially trained health care professional. If you have pain, swelling, burning or any unusual feeling around the site of your injection, tell your health care professional right away.  Talk to your pediatrician regarding the use of this medicine in children. Special care may be needed.  Overdosage: If you think you have taken too much of this medicine contact a poison control center or emergency room at once.  NOTE: This medicine is only for you. Do not share this medicine with others.  What if I miss a dose?  It is important not to miss your dose. Call your doctor or health care professional if you are unable to keep an appointment.  What may interact with this medicine?  Do not take this medicine with any of the following medications:  -cisapride  -droperidol  -halofantrine  -pimozide  -zidovudine  This  medicine may also interact with the following medications:  -chloroquine  -chlorpromazine  -clarithromycin  -cyclophosphamide  -cyclosporine  -erythromycin  -medicines for depression, anxiety, or psychotic disturbances  -medicines for irregular heart beat like amiodarone, bepridil, dofetilide, encainide, flecainide, propafenone, quinidine  -medicines for seizures like ethotoin, fosphenytoin, phenytoin  -medicines for nausea, vomiting like dolasetron, ondansetron, palonosetron  -medicines to increase blood counts like filgrastim, pegfilgrastim, sargramostim  -methadone  -methotrexate  -pentamidine  -progesterone  -vaccines  -verapamil  Talk to your doctor or health care professional before taking any of these medicines:  -acetaminophen  -aspirin  -ibuprofen  -ketoprofen  -naproxen  This list may not describe all possible interactions. Give your health care provider a list of all the medicines, herbs, non-prescription drugs, or dietary supplements you use. Also tell them if you smoke, drink alcohol, or use illegal drugs. Some items may interact with your medicine.  What should I watch for while using this medicine?  Your condition will be monitored carefully while you are receiving this medicine. You will need important blood work done while you are taking this medicine.  This drug may make you feel generally unwell. This is not uncommon, as chemotherapy can affect healthy cells as well as cancer cells. Report any side effects. Continue your course of treatment even though you feel ill unless your doctor tells you to stop.  Your urine may turn red for a few days after your dose. This is not blood. If your urine is dark or brown, call your doctor.    In some cases, you may be given additional medicines to help with side effects. Follow all directions for their use.  Call your doctor or health care professional for advice if you get a fever, chills or sore throat, or other symptoms of a cold or flu. Do not treat yourself.  This drug decreases your body's ability to fight infections. Try to avoid being around people who are sick.  This medicine may increase your risk to bruise or bleed. Call your doctor or health care professional if you notice any unusual bleeding.  Be careful brushing and flossing your teeth or using a toothpick because you may get an infection or bleed more easily. If you have any dental work done, tell your dentist you are receiving this medicine.  Avoid taking products that contain aspirin, acetaminophen, ibuprofen, naproxen, or ketoprofen unless instructed by your doctor. These medicines may hide a fever.  Men and women of childbearing age should use effective birth control methods while using taking this medicine. Do not become pregnant while taking this medicine. There is a potential for serious side effects to an unborn child. Talk to your health care professional or pharmacist for more information. Do not breast-feed an infant while taking this medicine.  Do not let others touch your urine or other body fluids for 5 days after each treatment with this medicine. Caregivers should wear latex gloves to avoid touching body fluids during this time.  There is a maximum amount of this medicine you should receive throughout your life. The amount depends on the medical condition being treated and your overall health. Your doctor will watch how much of this medicine you receive in your lifetime. Tell your doctor if you have taken this medicine before.  What side effects may I notice from receiving this medicine?  Side effects that you should report to your doctor or health care professional as soon as possible:  -allergic reactions like skin rash, itching or hives, swelling of the face, lips, or tongue  -low blood counts - this medicine may decrease the number of white blood cells, red blood cells and platelets. You may be at increased risk for infections and bleeding.  -signs of infection - fever or chills, cough,  sore throat, pain or difficulty passing urine  -signs of decreased platelets or bleeding - bruising, pinpoint red spots on the skin, black, tarry stools, blood in the urine  -signs of decreased red blood cells - unusually weak or tired, fainting spells, lightheadedness  -breathing problems  -chest pain  -fast, irregular heartbeat  -mouth sores  -nausea, vomiting  -pain, swelling, redness at site where injected  -pain, tingling, numbness in the hands or feet  -swelling of ankles, feet, or hands  -unusual bleeding or bruising  Side effects that usually do not require medical attention (report to your doctor or health care professional if they continue or are bothersome):  -diarrhea  -facial flushing  -hair loss  -loss of appetite  -missed menstrual periods  -nail discoloration or damage  -red or watery eyes  -red colored urine  -stomach upset  This list may not describe all possible side effects. Call your doctor for medical advice about side effects. You may report side effects to FDA at 1-800-FDA-1088.  Where should I keep my medicine?  This drug is given in a hospital or clinic and will not be stored at home.  NOTE: This sheet is a summary. It may not cover all possible information. If you have questions about   this medicine, talk to your doctor, pharmacist, or health care provider.     © 2016, [employer]. (2012-07-12 09:54:34)  Cyclophosphamide injection  What is this medicine?  CYCLOPHOSPHAMIDE (sye kloe FOSS fa mide) is a chemotherapy drug. It slows the growth of cancer cells. This medicine is used to treat many types of cancer like lymphoma, myeloma, leukemia, breast cancer, and ovarian cancer, to name a few.  This medicine may be used for other purposes; ask your health care provider or pharmacist if you have questions.  What should I tell my health care provider before I take this medicine?  They need to know if you have any of these conditions:  -blood disorders  -history of other  chemotherapy  -infection  -kidney disease  -liver disease  -recent or ongoing radiation therapy  -tumors in the bone marrow  -an unusual or allergic reaction to cyclophosphamide, other chemotherapy, other medicines, foods, dyes, or preservatives  -pregnant or trying to get pregnant  -breast-feeding  How should I use this medicine?  This drug is usually given as an injection into a vein or muscle or by infusion into a vein. It is administered in a hospital or clinic by a specially trained health care professional.  Talk to your pediatrician regarding the use of this medicine in children. Special care may be needed.  Overdosage: If you think you have taken too much of this medicine contact a poison control center or emergency room at once.  NOTE: This medicine is only for you. Do not share this medicine with others.  What if I miss a dose?  It is important not to miss your dose. Call your doctor or health care professional if you are unable to keep an appointment.  What may interact with this medicine?  This medicine may interact with the following medications:  -amiodarone  -amphotericin B  -azathioprine  -certain antiviral medicines for HIV or AIDS such as protease inhibitors (e.g., indinavir, ritonavir) and zidovudine  -certain blood pressure medications such as benazepril, captopril, enalapril, fosinopril, lisinopril, moexipril, monopril, perindopril, quinapril, ramipril, trandolapril  -certain cancer medications such as anthracyclines (e.g., daunorubicin, doxorubicin), busulfan, cytarabine, paclitaxel, pentostatin, tamoxifen, trastuzumab  -certain diuretics such as chlorothiazide, chlorthalidone, hydrochlorothiazide, indapamide, metolazone  -certain medicines that treat or prevent blood clots like warfarin  -certain muscle relaxants such as succinylcholine  -cyclosporine  -etanercept  -indomethacin  -medicines to increase blood counts like filgrastim, pegfilgrastim, sargramostim  -medicines used as general  anesthesia  -metronidazole  -natalizumab  This list may not describe all possible interactions. Give your health care provider a list of all the medicines, herbs, non-prescription drugs, or dietary supplements you use. Also tell them if you smoke, drink alcohol, or use illegal drugs. Some items may interact with your medicine.  What should I watch for while using this medicine?  Visit your doctor for checks on your progress. This drug may make you feel generally unwell. This is not uncommon, as chemotherapy can affect healthy cells as well as cancer cells. Report any side effects. Continue your course of treatment even though you feel ill unless your doctor tells you to stop.  Drink water or other fluids as directed. Urinate often, even at night.  In some cases, you may be given additional medicines to help with side effects. Follow all directions for their use.  Call your doctor or health care professional for advice if you get a fever, chills or sore throat, or other symptoms of a cold or flu. Do   not treat yourself. This drug decreases your body's ability to fight infections. Try to avoid being around people who are sick.  This medicine may increase your risk to bruise or bleed. Call your doctor or health care professional if you notice any unusual bleeding.  Be careful brushing and flossing your teeth or using a toothpick because you may get an infection or bleed more easily. If you have any dental work done, tell your dentist you are receiving this medicine.  You may get drowsy or dizzy. Do not drive, use machinery, or do anything that needs mental alertness until you know how this medicine affects you.  Do not become pregnant while taking this medicine or for 1 year after stopping it. Women should inform their doctor if they wish to become pregnant or think they might be pregnant. Men should not father a child while taking this medicine and for 4 months after stopping it. There is a potential for serious side  effects to an unborn child. Talk to your health care professional or pharmacist for more information. Do not breast-feed an infant while taking this medicine.  This medicine may interfere with the ability to have a child. This medicine has caused ovarian failure in some women. This medicine has caused reduced sperm counts in some men. You should talk with your doctor or health care professional if you are concerned about your fertility.  If you are going to have surgery, tell your doctor or health care professional that you have taken this medicine.  What side effects may I notice from receiving this medicine?  Side effects that you should report to your doctor or health care professional as soon as possible:  -allergic reactions like skin rash, itching or hives, swelling of the face, lips, or tongue  -low blood counts - this medicine may decrease the number of white blood cells, red blood cells and platelets. You may be at increased risk for infections and bleeding.  -signs of infection - fever or chills, cough, sore throat, pain or difficulty passing urine  -signs of decreased platelets or bleeding - bruising, pinpoint red spots on the skin, black, tarry stools, blood in the urine  -signs of decreased red blood cells - unusually weak or tired, fainting spells, lightheadedness  -breathing problems  -dark urine  -dizziness  -palpitations  -swelling of the ankles, feet, hands  -trouble passing urine or change in the amount of urine  -weight gain  -yellowing of the eyes or skin  Side effects that usually do not require medical attention (report to your doctor or health care professional if they continue or are bothersome):  -changes in nail or skin color  -hair loss  -missed menstrual periods  -mouth sores  -nausea, vomiting  This list may not describe all possible side effects. Call your doctor for medical advice about side effects. You may report side effects to FDA at 1-800-FDA-1088.  Where should I keep my  medicine?  This drug is given in a hospital or clinic and will not be stored at home.  NOTE: This sheet is a summary. It may not cover all possible information. If you have questions about this medicine, talk to your doctor, pharmacist, or health care provider.     © 2016, [employer]. (2012-01-29 16:22:58)

## 2015-08-23 NOTE — Progress Notes (Signed)
Patient Care Team: Vania Rea, MD as PCP - General (Obstetrics and Gynecology)  DIAGNOSIS: Breast cancer of upper-outer quadrant of right female breast [facility])   Staging form: Breast, AJCC 7th Edition   Clinical stage from 07/24/2015: Stage IA (T1c, N0, M0) - Unsigned   SUMMARY OF ONCOLOGIC HISTORY:   Breast cancer of upper-outer quadrant of right female breast ([place])   07/11/2015 Mammogram Right breast mass in the axillary tail 1.7 x 1.5 x 1.3 cm axillary ultrasound negative, T1c N0 stage IA clinical stage   07/11/2015 Initial Diagnosis Right breast biopsy 10:00 position: Invasive ductal carcinoma grade 3, ER 0%, P of 0%, Ki-67 95%, HER-2 negative ratio 1.21; right breast biopsy 12:00: Fibrocystic changes   08/02/2015 -  Neo-Adjuvant Chemotherapy Dose dense Adriamycin and Cytoxan 4 followed by Abraxane weekly 12    CHIEF COMPLIANT: Cycle 2 dose dense Adriamycin and Cytoxan  INTERVAL HISTORY: Courtney Gomez is a 50 year old with above-mentioned history of right breast cancer currently on neoadjuvant chemotherapy today cycle 2 of dose dense Adriamycin Cytoxan. Last week the dose was held because of neutropenia. She received 2 doses of neupogen. Otherwise she had absolutely no issue with her first cycle of treatment.   REVIEW OF SYSTEMS:   Constitutional: Denies fevers, chills or abnormal weight loss Eyes: Denies blurriness of vision Ears, nose, mouth, throat, and face: Denies mucositis or sore throat Respiratory: Denies cough, dyspnea or wheezes Cardiovascular: Denies palpitation, chest discomfort Gastrointestinal:  Denies nausea, heartburn or change in bowel habits Skin: Denies abnormal skin rashes Lymphatics: Denies new lymphadenopathy or easy bruising Neurological:Denies numbness, tingling or new weaknesses Behavioral/Psych: Mood is stable, no new changes  Extremities: No lower extremity edema Breast: Patient reports of the cancer that was palpable before has nearly resolved to  palpation All other systems were reviewed with the patient and are negative.  I have reviewed the past medical history, past surgical history, social history and family history with the patient and they are unchanged from previous note.  ALLERGIES:  is allergic to lanolin and latex.  MEDICATIONS:  Current Outpatient Prescriptions  Medication Sig Dispense Refill  . ADVAIR DISKUS 250-50 MCG/DOSE AEPB Inhale 1 puff into the lungs daily.   5  . cephALEXin (KEFLEX) 500 MG capsule Take 1 capsule (500 mg total) by mouth 2 (two) times daily. 14 capsule 0  . fluticasone (FLONASE) 50 MCG/ACT nasal spray Place 1 spray into both nostrils daily.    Marland Kitchen lidocaine-prilocaine (EMLA) cream Apply to affected area once 30 g 3  . LORazepam (ATIVAN) 0.5 MG tablet Take 1 tablet (0.5 mg total) by mouth at bedtime. 30 tablet 0  . montelukast (SINGULAIR) 10 MG tablet TAKE 1 TABLET (10 MG TOTAL) BY MOUTH AT BEDTIME. 30 tablet 3  . ondansetron (ZOFRAN) 8 MG tablet Take 1 tablet (8 mg total) by mouth 2 (two) times daily as needed. Start on the third day after chemotherapy. 30 tablet 1  . albuterol (PROAIR HFA) 108 (90 BASE) MCG/ACT inhaler Inhale 2 puffs into the lungs every 6 (six) hours as needed for wheezing or shortness of breath. Reported on 08/23/2015    . cetirizine (ZYRTEC) 10 MG tablet Take 10 mg by mouth daily. Reported on 08/23/2015    . HYDROcodone-acetaminophen (NORCO) 10-325 MG tablet Take 1 tablet by mouth every 6 (six) hours as needed.  0  . magic mouthwash w/lidocaine SOLN Take 19ms by mouth four times a day as needed. Swish and spit (Patient not taking: Reported on 08/23/2015)  240 mL 1  . prochlorperazine (COMPAZINE) 10 MG tablet Take 1 tablet (10 mg total) by mouth every 6 (six) hours as needed (Nausea or vomiting). (Patient not taking: Reported on 08/23/2015) 30 tablet 1   No current facility-administered medications for this visit.    PHYSICAL EXAMINATION: ECOG PERFORMANCE STATUS: 0 -  Asymptomatic  Filed Vitals:   08/23/15 0846  BP: 138/86  Pulse: 91  Temp: 97.9 F (36.6 C)  Resp: 18   Filed Weights   08/23/15 0846  Weight: 135 lb 4.8 oz (61.372 kg)    GENERAL:alert, no distress and comfortable SKIN: skin color, texture, turgor are normal, no rashes or significant lesions EYES: normal, Conjunctiva are pink and non-injected, sclera clear OROPHARYNX:no exudate, no erythema and lips, buccal mucosa, and tongue normal  NECK: supple, thyroid normal size, non-tender, without nodularity LYMPH:  no palpable lymphadenopathy in the cervical, axillary or inguinal LUNGS: clear to auscultation and percussion with normal breathing effort HEART: regular rate & rhythm and no murmurs and no lower extremity edema ABDOMEN:abdomen soft, non-tender and normal bowel sounds MUSCULOSKELETAL:no cyanosis of digits and no clubbing  NEURO: alert & oriented x 3 with fluent speech, no focal motor/sensory deficits EXTREMITIES: No lower extremity edema  LABORATORY DATA:  I have reviewed the data as listed   Chemistry      Component Value Date/Time   NA 144 08/23/2015 0832   K 3.8 08/23/2015 0832   CO2 27 08/23/2015 0832   BUN 13.3 08/23/2015 0832   CREATININE 0.8 08/23/2015 0832      Component Value Date/Time   CALCIUM 9.8 08/23/2015 0832   ALKPHOS 64 08/23/2015 0832   AST 38* 08/23/2015 0832   ALT 55 08/23/2015 0832   BILITOT 0.31 08/23/2015 0832       Lab Results  Component Value Date   WBC 5.7 08/23/2015   HGB 13.2 08/23/2015   HCT 39.5 08/23/2015   MCV 96.1 08/23/2015   PLT 248 08/23/2015   NEUTROABS 3.2 08/23/2015     ASSESSMENT & PLAN:  Breast cancer of upper-outer quadrant of right female breast ([place]) Right mammogram and ultrasound 07/28/2015 Right breast mass in the axillary tail 1.7 x 1.5 x 1.3 cm axillary ultrasound negative, T1c N0 stage IA clinical stage Right breast biopsy 07/11/2015: 10:00 position: Invasive ductal carcinoma grade 3, ER 0%, PR 0%, Ki-67  95%, HER-2 negative ratio 1.21  Treatment plan based on multidisciplinary tumor board: 1. Neoadjuvant chemotherapy with Adriamycin and Cytoxan dose dense 4 followed by Abraxane weekly 12 2. Followed by breast conserving surgery with sentinel lymph node study 3. Followed by adjuvant radiation therapy ------------------------------------------------------------------------------------------------------------------------------ Current treatment: Today cycle 2 day 1 of dose dense Adriamycin and Cytoxan Lab work was reviewed Echocardiogram 08/01/2015: EF 65-70% Breast MRI: 07/30/2015: right breast mass 2.4 x 2.1 x 2.1 cm with a preserved fat plane between the mass in the chest wall, 5 mm enhancing mass in the right breast at 12:00 C,biopsy-proven fibroadenoma.  Chemotherapy toxicities: 1. Neutropenia: resolved with neupogen x 2. ANC 3.2 today.  No other major side effects to chemotherapy. Denies any nausea or vomiting.  Return to clinic in 2 weeks for cycle 3   No orders of the defined types were placed in this encounter.   The patient has a good understanding of the overall plan. she agrees with it. she will call with any problems that may develop before the next visit here.   Laurie Panda, NP 08/23/2015

## 2015-08-23 NOTE — Telephone Encounter (Signed)
appt made and avs printed °

## 2015-08-23 NOTE — Progress Notes (Signed)
Pt reported that she started to burning in her nasal and head area. Told pt that it could be from the Cytoxan. Pt had finished Cytoxan infusion when she started to feel symptomatic. Flushed pt with 250cc of NSS flush. Immediately felt relief after a few minutes of saline flush. Told pt that she may need to have a longer infusion for next treatment. Pt is aware to remind RN next treatment. Pharmacy aware and will notify MD to let them be aware of pt symptoms.   1155- Pt declined avs at this time. Pt symptoms completely relieved. Will call if symptoms persist. PAC deaccessed w/o any issues.

## 2015-08-24 ENCOUNTER — Ambulatory Visit: Payer: 59

## 2015-08-24 ENCOUNTER — Ambulatory Visit (HOSPITAL_BASED_OUTPATIENT_CLINIC_OR_DEPARTMENT_OTHER): Payer: 59

## 2015-08-24 VITALS — BP 148/86 | HR 92 | Temp 98.6°F | Resp 16

## 2015-08-24 DIAGNOSIS — C50411 Malignant neoplasm of upper-outer quadrant of right female breast: Secondary | ICD-10-CM | POA: Diagnosis not present

## 2015-08-24 MED ORDER — PEGFILGRASTIM INJECTION 6 MG/0.6ML ~~LOC~~
6.0000 mg | PREFILLED_SYRINGE | Freq: Once | SUBCUTANEOUS | Status: AC
  Administered 2015-08-24: 6 mg via SUBCUTANEOUS

## 2015-08-24 NOTE — Patient Instructions (Signed)
Pegfilgrastim injection What is this medicine? PEGFILGRASTIM (PEG fil gra stim) is a long-acting granulocyte colony-stimulating factor that stimulates the growth of neutrophils, a type of white blood cell important in the body's fight against infection. It is used to reduce the incidence of fever and infection in patients with certain types of cancer who are receiving chemotherapy that affects the bone marrow, and to increase survival after being exposed to high doses of radiation. This medicine may be used for other purposes; ask your health care provider or pharmacist if you have questions. What should I tell my health care provider before I take this medicine? They need to know if you have any of these conditions: -kidney disease -latex allergy -ongoing radiation therapy -sickle cell disease -skin reactions to acrylic adhesives (On-Body Injector only) -an unusual or allergic reaction to pegfilgrastim, filgrastim, other medicines, foods, dyes, or preservatives -pregnant or trying to get pregnant -breast-feeding How should I use this medicine? This medicine is for injection under the skin. If you get this medicine at home, you will be taught how to prepare and give the pre-filled syringe or how to use the On-body Injector. Refer to the patient Instructions for Use for detailed instructions. Use exactly as directed. Take your medicine at regular intervals. Do not take your medicine more often than directed. It is important that you put your used needles and syringes in a special sharps container. Do not put them in a trash can. If you do not have a sharps container, call your pharmacist or healthcare provider to get one. Talk to your pediatrician regarding the use of this medicine in children. While this drug may be prescribed for selected conditions, precautions do apply. Overdosage: If you think you have taken too much of this medicine contact a poison control center or emergency room at  once. NOTE: This medicine is only for you. Do not share this medicine with others. What if I miss a dose? It is important not to miss your dose. Call your doctor or health care professional if you miss your dose. If you miss a dose due to an On-body Injector failure or leakage, a new dose should be administered as soon as possible using a single prefilled syringe for manual use. What may interact with this medicine? Interactions have not been studied. Give your health care provider a list of all the medicines, herbs, non-prescription drugs, or dietary supplements you use. Also tell them if you smoke, drink alcohol, or use illegal drugs. Some items may interact with your medicine. This list may not describe all possible interactions. Give your health care provider a list of all the medicines, herbs, non-prescription drugs, or dietary supplements you use. Also tell them if you smoke, drink alcohol, or use illegal drugs. Some items may interact with your medicine. What should I watch for while using this medicine? You may need blood work done while you are taking this medicine. If you are going to need a MRI, CT scan, or other procedure, tell your doctor that you are using this medicine (On-Body Injector only). What side effects may I notice from receiving this medicine? Side effects that you should report to your doctor or health care professional as soon as possible: -allergic reactions like skin rash, itching or hives, swelling of the face, lips, or tongue -dizziness -fever -pain, redness, or irritation at site where injected -pinpoint red spots on the skin -red or dark-brown urine -shortness of breath or breathing problems -stomach or side pain, or pain   at the shoulder -swelling -tiredness -trouble passing urine or change in the amount of urine Side effects that usually do not require medical attention (report to your doctor or health care professional if they continue or are  bothersome): -bone pain -muscle pain This list may not describe all possible side effects. Call your doctor for medical advice about side effects. You may report side effects to FDA at 1-800-FDA-1088. Where should I keep my medicine? Keep out of the reach of children. Store pre-filled syringes in a refrigerator between 2 and 8 degrees C (36 and 46 degrees F). Do not freeze. Keep in carton to protect from light. Throw away this medicine if it is left out of the refrigerator for more than 48 hours. Throw away any unused medicine after the expiration date. NOTE: This sheet is a summary. It may not cover all possible information. If you have questions about this medicine, talk to your doctor, pharmacist, or health care provider.    2016, Elsevier/Gold Standard. (2014-04-05 14:30:14)  

## 2015-08-27 ENCOUNTER — Encounter: Payer: Self-pay | Admitting: *Deleted

## 2015-08-30 ENCOUNTER — Other Ambulatory Visit: Payer: 59

## 2015-08-30 ENCOUNTER — Ambulatory Visit: Payer: 59

## 2015-09-02 ENCOUNTER — Telehealth: Payer: Self-pay | Admitting: Genetic Counselor

## 2015-09-02 NOTE — Telephone Encounter (Signed)
Discussed with Courtney Gomez that her genetic test results were negative for any pathogenic mutations within any of 20 genes that would increase her genetic risk for breast, ovarian, or other related cancers.  Additionally, no uncertain changes were found.  Discussed that this is most likely a reassuring result since there is no other known breast cancer in the family, most of the cancers in the family were diagnosed at later ages, and since most cancer is not genetic.  Encouraged her to call and update the family history with [place], if anyone else is diagnosed with cancer in the family.  She is also always welcome to check back about updated genetic testing options in the future.  Recommended she continue to follow her doctors' recommendations for future cancer screening.  Her sons should make their future providers aware of the family history of prostate cancer.  Courtney Gomez is happy to receive this news, she knows she is welcome to call with any questions.

## 2015-09-03 ENCOUNTER — Ambulatory Visit: Payer: Self-pay | Admitting: Genetic Counselor

## 2015-09-03 DIAGNOSIS — C50919 Malignant neoplasm of unspecified site of unspecified female breast: Secondary | ICD-10-CM

## 2015-09-03 DIAGNOSIS — Z809 Family history of malignant neoplasm, unspecified: Secondary | ICD-10-CM

## 2015-09-03 DIAGNOSIS — C50411 Malignant neoplasm of upper-outer quadrant of right female breast: Secondary | ICD-10-CM

## 2015-09-03 DIAGNOSIS — Z1379 Encounter for other screening for genetic and chromosomal anomalies: Secondary | ICD-10-CM

## 2015-09-05 ENCOUNTER — Other Ambulatory Visit: Payer: Self-pay | Admitting: *Deleted

## 2015-09-05 DIAGNOSIS — C50411 Malignant neoplasm of upper-outer quadrant of right female breast: Secondary | ICD-10-CM

## 2015-09-05 MED ORDER — LORAZEPAM 0.5 MG PO TABS: 0.5000 mg | ORAL_TABLET | Freq: Every day | ORAL | Status: DC

## 2015-09-05 NOTE — Assessment & Plan Note (Signed)
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 Neoadjuvant chemotherapy with Adriamycin and Cytoxan dose dense 4 followed by Abraxane weekly 12 2. Followed by breast conserving surgery with sentinel lymph node study 3. Followed by adjuvant radiation therapy ------------------------------------------------------------------------------------------------------------------------------ Current treatment: Today cycle 3 day 1 of dose dense Adriamycin and Cytoxan Lab work was reviewed Echocardiogram 08/01/2015: EF 65-70% Breast MRI: 07/30/2015: right breast mass 2.4 x 2.1 x 2.1 cm with a preserved fat plane between the mass in the chest wall, 5 mm enhancing mass in the right breast at 12:00 C,biopsy-proven fibroadenoma.  Chemotherapy toxicities: 1. Neutropenia: I suspect that the Neulasta injection did not work. She gets subcutaneous Neulasta injection instead of Onpro because I believe that her neutropenia with cycle 1 is related to malfunctioning of onpro.   2. Skin rash: Treated with Keflex for 1 week.   No other major side effects to chemotherapy. Denies any nausea or vomiting.  Return to clinic in 2 weeks for cycle 4

## 2015-09-06 ENCOUNTER — Ambulatory Visit (HOSPITAL_BASED_OUTPATIENT_CLINIC_OR_DEPARTMENT_OTHER): Payer: 59

## 2015-09-06 ENCOUNTER — Ambulatory Visit: Payer: 59 | Admitting: Hematology and Oncology

## 2015-09-06 ENCOUNTER — Ambulatory Visit (HOSPITAL_BASED_OUTPATIENT_CLINIC_OR_DEPARTMENT_OTHER): Payer: 59 | Admitting: Hematology and Oncology

## 2015-09-06 ENCOUNTER — Ambulatory Visit: Payer: 59

## 2015-09-06 ENCOUNTER — Encounter: Payer: Self-pay | Admitting: Hematology and Oncology

## 2015-09-06 ENCOUNTER — Other Ambulatory Visit: Payer: 59

## 2015-09-06 ENCOUNTER — Other Ambulatory Visit (HOSPITAL_BASED_OUTPATIENT_CLINIC_OR_DEPARTMENT_OTHER): Payer: 59

## 2015-09-06 ENCOUNTER — Encounter: Payer: Self-pay | Admitting: *Deleted

## 2015-09-06 ENCOUNTER — Telehealth: Payer: Self-pay | Admitting: Hematology and Oncology

## 2015-09-06 VITALS — BP 140/73 | HR 105 | Temp 99.2°F | Resp 18 | Ht 66.0 in | Wt 136.2 lb

## 2015-09-06 VITALS — HR 100

## 2015-09-06 DIAGNOSIS — C50411 Malignant neoplasm of upper-outer quadrant of right female breast: Secondary | ICD-10-CM

## 2015-09-06 DIAGNOSIS — D701 Agranulocytosis secondary to cancer chemotherapy: Secondary | ICD-10-CM

## 2015-09-06 DIAGNOSIS — Z5111 Encounter for antineoplastic chemotherapy: Secondary | ICD-10-CM

## 2015-09-06 LAB — COMPREHENSIVE METABOLIC PANEL
ALBUMIN: 4 g/dL (ref 3.5–5.0)
ALK PHOS: 93 U/L (ref 40–150)
ALT: 25 U/L (ref 0–55)
ANION GAP: 9 meq/L (ref 3–11)
AST: 17 U/L (ref 5–34)
BILIRUBIN TOTAL: 0.32 mg/dL (ref 0.20–1.20)
BUN: 10.5 mg/dL (ref 7.0–26.0)
CALCIUM: 9.5 mg/dL (ref 8.4–10.4)
CO2: 28 mEq/L (ref 22–29)
CREATININE: 0.9 mg/dL (ref 0.6–1.1)
Chloride: 106 mEq/L (ref 98–109)
EGFR: 79 mL/min/{1.73_m2} — AB (ref >90)
Glucose: 87 mg/dl (ref 70–140)
Potassium: 3.9 mEq/L (ref 3.5–5.1)
SODIUM: 143 meq/L (ref 136–145)
TOTAL PROTEIN: 7.2 g/dL (ref 6.4–8.3)

## 2015-09-06 LAB — CBC WITH DIFFERENTIAL/PLATELET
BASO%: 0.3 % (ref 0.0–2.0)
Basophils Absolute: 0 10*3/uL (ref 0.0–0.1)
EOS%: 0.4 % (ref 0.0–7.0)
Eosinophils Absolute: 0 10*3/uL (ref 0.0–0.5)
HCT: 35.4 % (ref 34.8–46.6)
HGB: 11.8 g/dL (ref 11.6–15.9)
LYMPH#: 1.7 10*3/uL (ref 0.9–3.3)
LYMPH%: 15.7 % (ref 14.0–49.7)
MCH: 32.2 pg (ref 25.1–34.0)
MCHC: 33.4 g/dL (ref 31.5–36.0)
MCV: 96.5 fL (ref 79.5–101.0)
MONO#: 0.7 10*3/uL (ref 0.1–0.9)
MONO%: 7.1 % (ref 0.0–14.0)
NEUT%: 76.5 % (ref 38.4–76.8)
NEUTROS ABS: 8.1 10*3/uL — AB (ref 1.5–6.5)
PLATELETS: 201 10*3/uL (ref 145–400)
RBC: 3.66 10*6/uL — AB (ref 3.70–5.45)
RDW: 13.7 % (ref 11.2–14.5)
WBC: 10.5 10*3/uL — AB (ref 3.9–10.3)

## 2015-09-06 MED ORDER — SODIUM CHLORIDE 0.9% FLUSH
10.0000 mL | INTRAVENOUS | Status: DC | PRN
  Administered 2015-09-06: 10 mL
  Filled 2015-09-06: qty 10

## 2015-09-06 MED ORDER — SODIUM CHLORIDE 0.9 % IV SOLN
600.0000 mg/m2 | Freq: Once | INTRAVENOUS | Status: AC
  Administered 2015-09-06: 1020 mg via INTRAVENOUS
  Filled 2015-09-06: qty 51

## 2015-09-06 MED ORDER — PALONOSETRON HCL INJECTION 0.25 MG/5ML
0.2500 mg | Freq: Once | INTRAVENOUS | Status: AC
  Administered 2015-09-06: 0.25 mg via INTRAVENOUS

## 2015-09-06 MED ORDER — HEPARIN SOD (PORK) LOCK FLUSH 100 UNIT/ML IV SOLN
500.0000 [IU] | Freq: Once | INTRAVENOUS | Status: AC | PRN
  Administered 2015-09-06: 500 [IU]
  Filled 2015-09-06: qty 5

## 2015-09-06 MED ORDER — SODIUM CHLORIDE 0.9 % IV SOLN
Freq: Once | INTRAVENOUS | Status: AC
  Administered 2015-09-06: 11:00:00 via INTRAVENOUS

## 2015-09-06 MED ORDER — SODIUM CHLORIDE 0.9 % IV SOLN
Freq: Once | INTRAVENOUS | Status: AC
  Administered 2015-09-06: 11:00:00 via INTRAVENOUS
  Filled 2015-09-06: qty 5

## 2015-09-06 MED ORDER — PALONOSETRON HCL INJECTION 0.25 MG/5ML
INTRAVENOUS | Status: AC
  Filled 2015-09-06: qty 5

## 2015-09-06 MED ORDER — DOXORUBICIN HCL CHEMO IV INJECTION 2 MG/ML
59.0000 mg/m2 | Freq: Once | INTRAVENOUS | Status: AC
  Administered 2015-09-06: 100 mg via INTRAVENOUS
  Filled 2015-09-06: qty 50

## 2015-09-06 NOTE — Patient Instructions (Signed)
[facility] Discharge Instructions for Patients Receiving Chemotherapy  Today you received the following chemotherapy agents:Adriamycin and Cytoxan   To help prevent nausea and vomiting after your treatment, we encourage you to take your nausea medication as directed.    If you develop nausea and vomiting that is not controlled by your nausea medication, call the clinic.   BELOW ARE SYMPTOMS THAT SHOULD BE REPORTED IMMEDIATELY:  *FEVER GREATER THAN 100.5 F  *CHILLS WITH OR WITHOUT FEVER  NAUSEA AND VOMITING THAT IS NOT CONTROLLED WITH YOUR NAUSEA MEDICATION  *UNUSUAL SHORTNESS OF BREATH  *UNUSUAL BRUISING OR BLEEDING  TENDERNESS IN MOUTH AND THROAT WITH OR WITHOUT PRESENCE OF ULCERS  *URINARY PROBLEMS  *BOWEL PROBLEMS  UNUSUAL RASH Items with * indicate a potential emergency and should be followed up as soon as possible.  Feel free to call the clinic you have any questions or concerns. The clinic phone number is [phone].  Please show the CHEMO ALERT CARD at check-in to the Emergency Department and triage nurse.   

## 2015-09-06 NOTE — Telephone Encounter (Signed)
per pof to sch pt appt-appts made added inj

## 2015-09-06 NOTE — Progress Notes (Signed)
Patient Care Team: Vania Rea, MD as PCP - General (Obstetrics and Gynecology)  DIAGNOSIS: Breast cancer of upper-outer quadrant of right female breast [facility])   Staging form: Breast, AJCC 7th Edition     Clinical stage from 07/24/2015: Stage IA (T1c, N0, M0) - Unsigned   SUMMARY OF ONCOLOGIC HISTORY:   Breast cancer of upper-outer quadrant of right female breast ([place])   07/11/2015 Mammogram Right breast mass in the axillary tail 1.7 x 1.5 x 1.3 cm axillary ultrasound negative, T1c N0 stage IA clinical stage   07/11/2015 Initial Diagnosis Right breast biopsy 10:00 position: Invasive ductal carcinoma grade 3, ER 0%, P of 0%, Ki-67 95%, HER-2 negative ratio 1.21; right breast biopsy 12:00: Fibrocystic changes   08/02/2015 -  Neo-Adjuvant Chemotherapy Dose dense Adriamycin and Cytoxan 4 followed by Abraxane weekly 12    CHIEF COMPLIANT: Cycle 3 dose dense Adriamycin and Cytoxan  INTERVAL HISTORY: Courtney Gomez is a 50 year old with above-mentioned history of right breast triple negative cancer who is currently on neoadjuvant chemotherapy and today is cycle 3 of dose dense Adriamycin and Cytoxan. She has tolerated chemotherapy extremely well except for one day of severe fatigue. She denies any nausea or vomiting. She had alopecia. Denies any fevers or chills.  REVIEW OF SYSTEMS:   Constitutional: Denies fevers, chills or abnormal weight loss Eyes: Denies blurriness of vision Ears, nose, mouth, throat, and face: Denies mucositis or sore throat Respiratory: Denies cough, dyspnea or wheezes Cardiovascular: Denies palpitation, chest discomfort Gastrointestinal:  Denies nausea, heartburn or change in bowel habits Skin: Denies abnormal skin rashes Lymphatics: Denies new lymphadenopathy or easy bruising Neurological:Denies numbness, tingling or new weaknesses Behavioral/Psych: Mood is stable, no new changes  Extremities: No lower extremity edema Breast:  denies any pain or lumps or nodules in  either breasts All other systems were reviewed with the patient and are negative.  I have reviewed the past medical history, past surgical history, social history and family history with the patient and they are unchanged from previous note.  ALLERGIES:  is allergic to lanolin and latex.  MEDICATIONS:  Current Outpatient Prescriptions  Medication Sig Dispense Refill  . ADVAIR DISKUS 250-50 MCG/DOSE AEPB Inhale 1 puff into the lungs daily.   5  . albuterol (PROAIR HFA) 108 (90 BASE) MCG/ACT inhaler Inhale 2 puffs into the lungs every 6 (six) hours as needed for wheezing or shortness of breath. Reported on 08/23/2015    . cephALEXin (KEFLEX) 500 MG capsule Take 1 capsule (500 mg total) by mouth 2 (two) times daily. 14 capsule 0  . cetirizine (ZYRTEC) 10 MG tablet Take 10 mg by mouth daily. Reported on 08/23/2015    . fluticasone (FLONASE) 50 MCG/ACT nasal spray Place 1 spray into both nostrils daily.    Marland Kitchen HYDROcodone-acetaminophen (NORCO) 10-325 MG tablet Take 1 tablet by mouth every 6 (six) hours as needed.  0  . lidocaine-prilocaine (EMLA) cream Apply to affected area once 30 g 3  . LORazepam (ATIVAN) 0.5 MG tablet Take 1 tablet (0.5 mg total) by mouth at bedtime. 30 tablet 1  . magic mouthwash w/lidocaine SOLN Take 77ms by mouth four times a day as needed. Swish and spit (Patient not taking: Reported on 08/23/2015) 240 mL 1  . montelukast (SINGULAIR) 10 MG tablet TAKE 1 TABLET (10 MG TOTAL) BY MOUTH AT BEDTIME. 30 tablet 3  . ondansetron (ZOFRAN) 8 MG tablet Take 1 tablet (8 mg total) by mouth 2 (two) times daily as needed. Start on  the third day after chemotherapy. 30 tablet 1  . prochlorperazine (COMPAZINE) 10 MG tablet Take 1 tablet (10 mg total) by mouth every 6 (six) hours as needed (Nausea or vomiting). (Patient not taking: Reported on 08/23/2015) 30 tablet 1   No current facility-administered medications for this visit.    PHYSICAL EXAMINATION: ECOG PERFORMANCE STATUS: 1 - Symptomatic  but completely ambulatory  Filed Vitals:   09/06/15 1006  BP: 140/73  Pulse: 105  Temp: 99.2 F (37.3 C)  Resp: 18   Filed Weights   09/06/15 1006  Weight: 136 lb 3.2 oz (61.78 kg)    GENERAL:alert, no distress and comfortable SKIN: skin color, texture, turgor are normal, no rashes or significant lesions EYES: normal, Conjunctiva are pink and non-injected, sclera clear OROPHARYNX:no exudate, no erythema and lips, buccal mucosa, and tongue normal  NECK: supple, thyroid normal size, non-tender, without nodularity LYMPH:  no palpable lymphadenopathy in the cervical, axillary or inguinal LUNGS: clear to auscultation and percussion with normal breathing effort HEART: regular rate & rhythm and no murmurs and no lower extremity edema ABDOMEN:abdomen soft, non-tender and normal bowel sounds MUSCULOSKELETAL:no cyanosis of digits and no clubbing  NEURO: alert & oriented x 3 with fluent speech, no focal motor/sensory deficits EXTREMITIES: No lower extremity edema   LABORATORY DATA:  I have reviewed the data as listed   Chemistry      Component Value Date/Time   NA 143 09/06/2015 0944   K 3.9 09/06/2015 0944   CO2 28 09/06/2015 0944   BUN 10.5 09/06/2015 0944   CREATININE 0.9 09/06/2015 0944      Component Value Date/Time   CALCIUM 9.5 09/06/2015 0944   ALKPHOS 93 09/06/2015 0944   AST 17 09/06/2015 0944   ALT 25 09/06/2015 0944   BILITOT 0.32 09/06/2015 0944       Lab Results  Component Value Date   WBC 10.5* 09/06/2015   HGB 11.8 09/06/2015   HCT 35.4 09/06/2015   MCV 96.5 09/06/2015   PLT 201 09/06/2015   NEUTROABS 8.1* 09/06/2015     ASSESSMENT & PLAN:  Breast cancer of upper-outer quadrant of right female breast ([facility]) Right mammogram and ultrasound 07/28/2015 Right breast mass in the axillary tail 1.7 x 1.5 x 1.3 cm axillary ultrasound negative, T1c N0 stage IA clinical stage Right breast biopsy 07/11/2015: 10:00 position: Invasive ductal carcinoma grade 3, ER  0%, PR 0%, Ki-67 95%, HER-2 negative ratio 1.21  Treatment plan based on multidisciplinary tumor board: 1. Neoadjuvant chemotherapy with Adriamycin and Cytoxan dose dense 4 followed by Abraxane weekly 12 2. Followed by breast conserving surgery with sentinel lymph node study 3. Followed by adjuvant radiation therapy ------------------------------------------------------------------------------------------------------------------------------ Current treatment: Today cycle 3 day 1 of dose dense Adriamycin and Cytoxan Lab work was reviewed Echocardiogram 08/01/2015: EF 65-70% Breast MRI: 07/30/2015: right breast mass 2.4 x 2.1 x 2.1 cm with a preserved fat plane between the mass in the chest wall, 5 mm enhancing mass in the right breast at 12:00 C,biopsy-proven fibroadenoma.  Chemotherapy toxicities: 1. Neutropenia: I suspect that the Neulasta injection did not work. She gets subcutaneous Neulasta injection instead of Onpro because I believe that her neutropenia with cycle 1 is related to malfunctioning of onpro.   2. Skin rash: Treated with Keflex for 1 week. Resolved  No other major side effects to chemotherapy. Denies any nausea or vomiting.  Return to clinic in 2 weeks for cycle 4   No orders of the defined types were placed in  this encounter.   The patient has a good understanding of the overall plan. she agrees with it. she will call with any problems that may develop before the next visit here.   Rulon Eisenmenger, MD 09/06/2015

## 2015-09-07 ENCOUNTER — Ambulatory Visit (HOSPITAL_BASED_OUTPATIENT_CLINIC_OR_DEPARTMENT_OTHER): Payer: 59

## 2015-09-07 VITALS — BP 132/68 | HR 87 | Temp 97.8°F | Resp 20

## 2015-09-07 DIAGNOSIS — C50411 Malignant neoplasm of upper-outer quadrant of right female breast: Secondary | ICD-10-CM

## 2015-09-07 DIAGNOSIS — D701 Agranulocytosis secondary to cancer chemotherapy: Secondary | ICD-10-CM | POA: Diagnosis not present

## 2015-09-07 MED ORDER — PEGFILGRASTIM INJECTION 6 MG/0.6ML ~~LOC~~
6.0000 mg | PREFILLED_SYRINGE | Freq: Once | SUBCUTANEOUS | Status: AC
  Administered 2015-09-07: 6 mg via SUBCUTANEOUS

## 2015-09-07 NOTE — Patient Instructions (Signed)
Pegfilgrastim injection What is this medicine? PEGFILGRASTIM (PEG fil gra stim) is a long-acting granulocyte colony-stimulating factor that stimulates the growth of neutrophils, a type of white blood cell important in the body's fight against infection. It is used to reduce the incidence of fever and infection in patients with certain types of cancer who are receiving chemotherapy that affects the bone marrow, and to increase survival after being exposed to high doses of radiation. This medicine may be used for other purposes; ask your health care provider or pharmacist if you have questions. What should I tell my health care provider before I take this medicine? They need to know if you have any of these conditions: -kidney disease -latex allergy -ongoing radiation therapy -sickle cell disease -skin reactions to acrylic adhesives (On-Body Injector only) -an unusual or allergic reaction to pegfilgrastim, filgrastim, other medicines, foods, dyes, or preservatives -pregnant or trying to get pregnant -breast-feeding How should I use this medicine? This medicine is for injection under the skin. If you get this medicine at home, you will be taught how to prepare and give the pre-filled syringe or how to use the On-body Injector. Refer to the patient Instructions for Use for detailed instructions. Use exactly as directed. Take your medicine at regular intervals. Do not take your medicine more often than directed. It is important that you put your used needles and syringes in a special sharps container. Do not put them in a trash can. If you do not have a sharps container, call your pharmacist or healthcare provider to get one. Talk to your pediatrician regarding the use of this medicine in children. While this drug may be prescribed for selected conditions, precautions do apply. Overdosage: If you think you have taken too much of this medicine contact a poison control center or emergency room at  once. NOTE: This medicine is only for you. Do not share this medicine with others. What if I miss a dose? It is important not to miss your dose. Call your doctor or health care professional if you miss your dose. If you miss a dose due to an On-body Injector failure or leakage, a new dose should be administered as soon as possible using a single prefilled syringe for manual use. What may interact with this medicine? Interactions have not been studied. Give your health care provider a list of all the medicines, herbs, non-prescription drugs, or dietary supplements you use. Also tell them if you smoke, drink alcohol, or use illegal drugs. Some items may interact with your medicine. This list may not describe all possible interactions. Give your health care provider a list of all the medicines, herbs, non-prescription drugs, or dietary supplements you use. Also tell them if you smoke, drink alcohol, or use illegal drugs. Some items may interact with your medicine. What should I watch for while using this medicine? You may need blood work done while you are taking this medicine. If you are going to need a MRI, CT scan, or other procedure, tell your doctor that you are using this medicine (On-Body Injector only). What side effects may I notice from receiving this medicine? Side effects that you should report to your doctor or health care professional as soon as possible: -allergic reactions like skin rash, itching or hives, swelling of the face, lips, or tongue -dizziness -fever -pain, redness, or irritation at site where injected -pinpoint red spots on the skin -red or dark-brown urine -shortness of breath or breathing problems -stomach or side pain, or pain   at the shoulder -swelling -tiredness -trouble passing urine or change in the amount of urine Side effects that usually do not require medical attention (report to your doctor or health care professional if they continue or are  bothersome): -bone pain -muscle pain This list may not describe all possible side effects. Call your doctor for medical advice about side effects. You may report side effects to FDA at 1-800-FDA-1088. Where should I keep my medicine? Keep out of the reach of children. Store pre-filled syringes in a refrigerator between 2 and 8 degrees C (36 and 46 degrees F). Do not freeze. Keep in carton to protect from light. Throw away this medicine if it is left out of the refrigerator for more than 48 hours. Throw away any unused medicine after the expiration date. NOTE: This sheet is a summary. It may not cover all possible information. If you have questions about this medicine, talk to your doctor, pharmacist, or health care provider.    2016, Elsevier/Gold Standard. (2014-04-05 14:30:14)  

## 2015-09-09 DIAGNOSIS — Z1379 Encounter for other screening for genetic and chromosomal anomalies: Secondary | ICD-10-CM | POA: Insufficient documentation

## 2015-09-09 NOTE — Progress Notes (Signed)
GENETIC TEST RESULT  HPI: Ms. Imhoff was previously seen in the [place] clinic due to a personal history of triple negative breast cancer at age 50, family history of cancer, and concerns regarding a hereditary predisposition to cancer. Please refer to our prior cancer genetics clinic note from Aug 15, 2015 for more information regarding Ms. Willhelm medical, social and family histories, and our assessment and recommendations, at the time. Ms. Horn recent genetic test results were disclosed to her, as were recommendations warranted by these results. These results and recommendations are discussed in more detail below.  GENETIC TEST RESULTS: At the time of Ms. Bruso visit, we recommended she pursue genetic testing of the 20-gene Breast/Ovarian Cancer Panel through [facility].  The Breast/Ovarian Cancer Panel offered by [facility] Hope Pigeon, MD) includes sequencing and deletion/duplication analysis for the following 19 genes:  ATM, BARD1, BRCA1, BRCA2, BRIP1, CDH1, CHEK2, FANCC, MLH1, MSH2, MSH6, NBN, PALB2, PMS2, PTEN, RAD51C, RAD51D, TP53, and XRCC2.  This panel also includes deletion/duplication analysis (without sequencing) for one gene, EPCAM.  Those results are now back, the report date for which is August 30, 2015.  Genetic testing was normal, and did not reveal a deleterious mutation in these genes.  Additionally, no variants of uncertain significance (VUSes) were found.  The test report will be scanned into EPIC and will be located under the Results Review tab in the Pathology>Molecular Pathology section.   We discussed with Ms. Drawdy that since the current genetic testing is not perfect, it is possible there may be a gene mutation in one of these genes that current testing cannot detect, but that chance is small. We also discussed, that it is possible that another gene that has not yet been discovered, or that we have not yet tested, is responsible for  the cancer diagnoses in the family, and it is, therefore, important to remain in touch with cancer genetics in the future so that we can continue to offer Ms. Schexnayder the most up-to-date genetic testing.    CANCER SCREENING RECOMMENDATIONS: This result is reassuring and indicates that Ms. Lynde likely does not have an increased risk for a future cancer due to a mutation in one of these genes. This normal test also suggests that Ms. Klink cancer was most likely not due to an inherited predisposition associated with one of these genes.  Most cancers happen by chance and this negative test suggests that her cancer falls into this category.  We, therefore, recommended she continue to follow the cancer management and screening guidelines provided by her oncology and primary healthcare providers.   RECOMMENDATIONS FOR FAMILY MEMBERS: Women in this family might be at some increased risk of developing cancer, over the general population risk, simply due to the family history of cancer. We recommended women in this family have a yearly mammogram beginning at age 10, or 75 years younger than the earliest onset of cancer, an annual clinical breast exam, and perform monthly breast self-exams. Women in this family should also have a gynecological exam as recommended by their primary provider. All family members should have a colonoscopy by age 14.  Ms. Kugel sons should make their future primary doctors aware of the family history of prostate cancer, so that they may receive the most appropriate medical management.  FOLLOW-UP: Lastly, we discussed with Ms. Shan that cancer genetics is a rapidly advancing field and it is possible that new genetic tests will be appropriate for her and/or her family members in  the future. We encouraged her to remain in contact with cancer genetics on an annual basis so we can update her personal and family histories and let her know of advances in cancer genetics that may benefit  this family.   Our contact number was provided. Ms. Schnurr questions were answered to her satisfaction, and she knows she is welcome to call us at anytime with additional questions or concerns.   Jeanine Luz, MS, [facility] Certified Genetic Counselor [email or web address] Phone: [phone]

## 2015-09-13 ENCOUNTER — Ambulatory Visit: Payer: 59 | Admitting: Nurse Practitioner

## 2015-09-13 ENCOUNTER — Telehealth: Payer: Self-pay | Admitting: *Deleted

## 2015-09-13 ENCOUNTER — Ambulatory Visit: Payer: 59

## 2015-09-13 ENCOUNTER — Other Ambulatory Visit: Payer: 59

## 2015-09-13 ENCOUNTER — Other Ambulatory Visit: Payer: Self-pay | Admitting: *Deleted

## 2015-09-13 DIAGNOSIS — C50411 Malignant neoplasm of upper-outer quadrant of right female breast: Secondary | ICD-10-CM

## 2015-09-13 MED ORDER — GABAPENTIN 300 MG PO CAPS: 300.0000 mg | ORAL_CAPSULE | Freq: Every day | ORAL | Status: DC

## 2015-09-13 NOTE — Telephone Encounter (Signed)
Received call from patient stating she has been having some pain on the bottom of her toes. She states it feels like there are blisters on the bottom of her toes but there are none and they just feel sensitive.  I informed her that neuropathy is uncommon with her chemo regimen.  Discussed with Dr. Jana Hakim he suggested to try some neurontin at night.  Rx called into her pharmacy.  She will see Dr. Lindi Adie 6/23 before her next chemo.

## 2015-09-17 ENCOUNTER — Other Ambulatory Visit (HOSPITAL_COMMUNITY): Payer: Self-pay | Admitting: Internal Medicine

## 2015-09-17 ENCOUNTER — Ambulatory Visit (HOSPITAL_BASED_OUTPATIENT_CLINIC_OR_DEPARTMENT_OTHER)
Admission: RE | Admit: 2015-09-17 | Discharge: 2015-09-17 | Disposition: A | Discharge status: Home / Self Care | Payer: 59 | Source: Ambulatory Visit | Attending: Internal Medicine | Admitting: Internal Medicine

## 2015-09-17 ENCOUNTER — Ambulatory Visit (HOSPITAL_COMMUNITY)
Admission: RE | Admit: 2015-09-17 | Discharge: 2015-09-17 | Disposition: A | Discharge status: Home / Self Care | Payer: 59 | Source: Ambulatory Visit | Attending: Obstetrics & Gynecology | Admitting: Obstetrics & Gynecology

## 2015-09-17 ENCOUNTER — Encounter (HOSPITAL_COMMUNITY): Payer: Self-pay | Admitting: Internal Medicine

## 2015-09-17 VITALS — BP 144/68 | HR 92 | Wt 135.5 lb

## 2015-09-17 DIAGNOSIS — Z09 Encounter for follow-up examination after completed treatment for conditions other than malignant neoplasm: Secondary | ICD-10-CM | POA: Diagnosis present

## 2015-09-17 DIAGNOSIS — C50411 Malignant neoplasm of upper-outer quadrant of right female breast: Secondary | ICD-10-CM

## 2015-09-17 LAB — ECHOCARDIOGRAM LIMITED
E decel time: 92 msec
EERAT: 6.33
FS: 34 % (ref 28–44)
IVS/LV PW RATIO, ED: 0.99
LA ID, A-P, ES: 27 mm
LA diam end sys: 27 mm
LA diam index: 1.59 cm/m2
LA vol A4C: 24.4 ml
LA vol index: 20.1 mL/m2
LAVOL: 34.2 mL
LV E/e' medial: 6.33
LV E/e'average: 6.33
LV PW d: 9.8 mm — AB (ref 0.6–1.1)
LV SIMPSON'S DISK: 56
LV TDI E'LATERAL: 12.1
LV dias vol index: 29 mL/m2
LV dias vol: 49 mL (ref 46–106)
LV e' LATERAL: 12.1 cm/s
LV sys vol index: 13 mL/m2
LVOT area: 3.46 cm2
LVOT diameter: 21 mm
LVSYSVOL: 22 mL (ref 14–42)
MV Dec: 92
MVPG: 2 mmHg
MVPKAVEL: 87.8 m/s
MVPKEVEL: 76.6 m/s
Stroke v: 27 ml
TDI e' medial: 8.92

## 2015-09-17 NOTE — Addendum Note (Signed)
Encounter addended by: Effie Berkshire, RN on: 09/17/2015 11:31 AM<BR>     Documentation filed: [employer], Orders

## 2015-09-17 NOTE — Progress Notes (Signed)
Echocardiogram 2D Echocardiogram limited has been performed.  Courtney Gomez 09/17/2015, 9:33 AM

## 2015-09-17 NOTE — Progress Notes (Signed)
Patient ID: Courtney Gomez, female   DOB: Sep 04, 1965, 50 y.o.   MRN: [id number] Patient ID: Courtney Gomez, female   DOB: 09/17/1965, 50 y.o.   MRN: [id number]   [facility] CLINIC CONSULT NOTE  Referring Physician: Dr Lindi Adie Primary Care: Primary Cardiologist: Dr Haroldine Laws  Oncologist: Dr Lindi Adie. General Surgeon: Dr Donne Hazel.    HPI: Courtney Gomez is a 50 year old with a history of asthma, right breast cancer,invasive ductal carcinoma grade 3, ER 0%, PR of 0%, Ki-67 95%, HER-2 negative ratio 1.21 receiving neoadjuvant chemotherapy with dose dense Adriamycin and Cytoxan x4 followed by abraxane weekly x 12. She has not cardiac history. She is referred to the cardio-onc clinic by Dr Lindi Adie.    Today she presents for follow-up. Overall feeling good. Has completed 3 cycles of adriamycin and has last cycle on Friday. Doing well but has has some neuropathy.  Denies SOB/Orthopnea. Still active. + fatigue Continues to work full time.   ECHO 08/01/2015 EF 65% GLS -19.9 Lat S' 12.5  ECHO 09/17/15: EF 65% GLS -17.4 Lateral s/ 12.5 cm/sec   Family History  Mom HTN Dad: CAD   Filed Vitals:   09/17/15 1030  BP: 144/68  Pulse: 92  Weight: 135 lb 8 oz (61.462 kg)  SpO2: 100%    PHYSICAL EXAM: General:  Well appearing. No respiratory difficulty. Ambulated in the clinic without difficulty.  HEENT: normal Neck: supple. no JVD. Carotids 2+ bilat; no bruits. No lymphadenopathy or thryomegaly appreciated. Cor: PMI nondisplaced. Regular rate & rhythm. No rubs, gallops or murmurs.R upper chest scar ecchymotic from porta cath.  Lungs: clear Abdomen: soft, nontender, nondistended. No hepatosplenomegaly. No bruits or masses. Good bowel sounds. Extremities: no cyanosis, clubbing, rash, edema Neuro: alert & oriented x 3, cranial nerves grossly intact. moves all 4 extremities w/o difficulty. Affect pleasant.  ASSESSMENT & PLAN: 1. R Breast Cancer-Invasive ductal carcinoma grade 3, ER 0%, PR 0%, Ki-67  95%, HER-2 negative  Tolerated 3/4 cycles of Adrimycin without any cardiac effect. Will see back in 6 months with post-chemo surveillance echo.  2. H/O Asthma.     Glori Bickers MD 11:20 AM

## 2015-09-18 ENCOUNTER — Encounter: Payer: Self-pay | Admitting: Hematology and Oncology

## 2015-09-19 ENCOUNTER — Other Ambulatory Visit: Payer: Self-pay | Admitting: Hematology and Oncology

## 2015-09-19 ENCOUNTER — Telehealth: Payer: Self-pay | Admitting: *Deleted

## 2015-09-19 NOTE — Telephone Encounter (Signed)
  Oncology Nurse Navigator Documentation    Navigator Encounter Type: Telephone (09/19/15 1500) Telephone: Courtney Gomez Call;Appt Confirmation/Clarification (09/19/15 1500)                                        Time Spent with Patient: 15 (09/19/15 1500)

## 2015-09-20 ENCOUNTER — Ambulatory Visit (HOSPITAL_BASED_OUTPATIENT_CLINIC_OR_DEPARTMENT_OTHER): Payer: 59

## 2015-09-20 ENCOUNTER — Ambulatory Visit (HOSPITAL_BASED_OUTPATIENT_CLINIC_OR_DEPARTMENT_OTHER): Payer: 59 | Admitting: Hematology and Oncology

## 2015-09-20 ENCOUNTER — Telehealth: Payer: Self-pay | Admitting: Hematology and Oncology

## 2015-09-20 ENCOUNTER — Encounter: Payer: Self-pay | Admitting: Hematology and Oncology

## 2015-09-20 ENCOUNTER — Other Ambulatory Visit (HOSPITAL_BASED_OUTPATIENT_CLINIC_OR_DEPARTMENT_OTHER): Payer: 59

## 2015-09-20 VITALS — BP 144/75 | HR 89 | Temp 98.9°F | Resp 18 | Ht 66.0 in | Wt 136.9 lb

## 2015-09-20 DIAGNOSIS — C50411 Malignant neoplasm of upper-outer quadrant of right female breast: Secondary | ICD-10-CM

## 2015-09-20 DIAGNOSIS — R21 Rash and other nonspecific skin eruption: Secondary | ICD-10-CM | POA: Diagnosis not present

## 2015-09-20 DIAGNOSIS — D701 Agranulocytosis secondary to cancer chemotherapy: Secondary | ICD-10-CM | POA: Diagnosis not present

## 2015-09-20 DIAGNOSIS — Z5111 Encounter for antineoplastic chemotherapy: Secondary | ICD-10-CM

## 2015-09-20 LAB — COMPREHENSIVE METABOLIC PANEL
ALBUMIN: 3.9 g/dL (ref 3.5–5.0)
ALK PHOS: 93 U/L (ref 40–150)
ALT: 23 U/L (ref 0–55)
ANION GAP: 9 meq/L (ref 3–11)
AST: 18 U/L (ref 5–34)
BUN: 7.2 mg/dL (ref 7.0–26.0)
CALCIUM: 9.3 mg/dL (ref 8.4–10.4)
CO2: 28 mEq/L (ref 22–29)
Chloride: 106 mEq/L (ref 98–109)
Creatinine: 0.8 mg/dL (ref 0.6–1.1)
EGFR: 88 mL/min/{1.73_m2} — AB (ref >90)
Glucose: 92 mg/dl (ref 70–140)
POTASSIUM: 3.7 meq/L (ref 3.5–5.1)
SODIUM: 143 meq/L (ref 136–145)
Total Bilirubin: 0.31 mg/dL (ref 0.20–1.20)
Total Protein: 7.1 g/dL (ref 6.4–8.3)

## 2015-09-20 LAB — CBC WITH DIFFERENTIAL/PLATELET
BASO%: 0.4 % (ref 0.0–2.0)
BASOS ABS: 0.1 10*3/uL (ref 0.0–0.1)
EOS ABS: 0.1 10*3/uL (ref 0.0–0.5)
EOS%: 0.6 % (ref 0.0–7.0)
HEMATOCRIT: 31.8 % — AB (ref 34.8–46.6)
HEMOGLOBIN: 10.4 g/dL — AB (ref 11.6–15.9)
LYMPH%: 10.4 % — ABNORMAL LOW (ref 14.0–49.7)
MCH: 32.2 pg (ref 25.1–34.0)
MCHC: 32.9 g/dL (ref 31.5–36.0)
MCV: 98.1 fL (ref 79.5–101.0)
MONO#: 1 10*3/uL — ABNORMAL HIGH (ref 0.1–0.9)
MONO%: 8.4 % (ref 0.0–14.0)
NEUT#: 9.8 10*3/uL — ABNORMAL HIGH (ref 1.5–6.5)
NEUT%: 80.2 % — ABNORMAL HIGH (ref 38.4–76.8)
PLATELETS: 180 10*3/uL (ref 145–400)
RBC: 3.24 10*6/uL — ABNORMAL LOW (ref 3.70–5.45)
RDW: 16.4 % — AB (ref 11.2–14.5)
WBC: 12.2 10*3/uL — ABNORMAL HIGH (ref 3.9–10.3)
lymph#: 1.3 10*3/uL (ref 0.9–3.3)

## 2015-09-20 MED ORDER — DOXORUBICIN HCL CHEMO IV INJECTION 2 MG/ML
59.0000 mg/m2 | Freq: Once | INTRAVENOUS | Status: AC
  Administered 2015-09-20: 100 mg via INTRAVENOUS
  Filled 2015-09-20: qty 50

## 2015-09-20 MED ORDER — HEPARIN SOD (PORK) LOCK FLUSH 100 UNIT/ML IV SOLN
500.0000 [IU] | Freq: Once | INTRAVENOUS | Status: AC | PRN
  Administered 2015-09-20: 500 [IU]
  Filled 2015-09-20: qty 5

## 2015-09-20 MED ORDER — SODIUM CHLORIDE 0.9 % IV SOLN
600.0000 mg/m2 | Freq: Once | INTRAVENOUS | Status: AC
  Administered 2015-09-20: 1020 mg via INTRAVENOUS
  Filled 2015-09-20: qty 51

## 2015-09-20 MED ORDER — SODIUM CHLORIDE 0.9% FLUSH
10.0000 mL | INTRAVENOUS | Status: DC | PRN
  Administered 2015-09-20: 10 mL
  Filled 2015-09-20: qty 10

## 2015-09-20 MED ORDER — SODIUM CHLORIDE 0.9 % IV SOLN
Freq: Once | INTRAVENOUS | Status: AC
  Administered 2015-09-20: 12:00:00 via INTRAVENOUS
  Filled 2015-09-20: qty 5

## 2015-09-20 MED ORDER — PALONOSETRON HCL INJECTION 0.25 MG/5ML
0.2500 mg | Freq: Once | INTRAVENOUS | Status: AC
  Administered 2015-09-20: 0.25 mg via INTRAVENOUS

## 2015-09-20 MED ORDER — SODIUM CHLORIDE 0.9 % IV SOLN
Freq: Once | INTRAVENOUS | Status: AC
  Administered 2015-09-20: 12:00:00 via INTRAVENOUS

## 2015-09-20 MED ORDER — PALONOSETRON HCL INJECTION 0.25 MG/5ML
INTRAVENOUS | Status: AC
  Filled 2015-09-20: qty 5

## 2015-09-20 NOTE — Progress Notes (Signed)
Patient Care Team: Vania Rea, MD as PCP - General (Obstetrics and Gynecology)  DIAGNOSIS: Breast cancer of upper-outer quadrant of right female breast [facility])   Staging form: Breast, AJCC 7th Edition     Clinical stage from 07/24/2015: Stage IA (T1c, N0, M0) - Unsigned   SUMMARY OF ONCOLOGIC HISTORY:   Breast cancer of upper-outer quadrant of right female breast ([place])   07/11/2015 Mammogram Right breast mass in the axillary tail 1.7 x 1.5 x 1.3 cm axillary ultrasound negative, T1c N0 stage IA clinical stage   07/11/2015 Initial Diagnosis Right breast biopsy 10:00 position: Invasive ductal carcinoma grade 3, ER 0%, P of 0%, Ki-67 95%, HER-2 negative ratio 1.21; right breast biopsy 12:00: Fibrocystic changes   08/02/2015 -  Neo-Adjuvant Chemotherapy Dose dense Adriamycin and Cytoxan 4 followed by Taxol weekly 12    CHIEF COMPLIANT: Cycle 4 of dose dense Adriamycin and Cytoxan  INTERVAL HISTORY: Courtney Gomez is a 50 year old with above-mentioned history of right breast cancer currently on neo-adjuvant chemotherapy. Today is cycle 4 of Adriamycin and Cytoxan. After the last cycle she had some burning sensation on the bottom of feet for which she was prescribed Neurontin and with 1 tablet of Neurontin her symptoms have resolved. She has had occasional mouth sores and fatigue along with alopecia.  REVIEW OF SYSTEMS:   Constitutional: Denies fevers, chills or abnormal weight loss Eyes: Denies blurriness of vision Ears, nose, mouth, throat, and face: Denies mucositis or sore throat Respiratory: Denies cough, dyspnea or wheezes Cardiovascular: Denies palpitation, chest discomfort Gastrointestinal:  Denies nausea, heartburn or change in bowel habits Skin: Denies abnormal skin rashes Lymphatics: Denies new lymphadenopathy or easy bruising Neurological:Denies numbness, tingling or new weaknesses Behavioral/Psych: Mood is stable, no new changes  Extremities: No lower extremity edema Breast:   denies any pain or lumps or nodules in either breasts All other systems were reviewed with the patient and are negative.  I have reviewed the past medical history, past surgical history, social history and family history with the patient and they are unchanged from previous note.  ALLERGIES:  is allergic to lanolin and latex.  MEDICATIONS:  Current Outpatient Prescriptions  Medication Sig Dispense Refill  . ADVAIR DISKUS 250-50 MCG/DOSE AEPB Inhale 1 puff into the lungs daily.   5  . albuterol (PROAIR HFA) 108 (90 BASE) MCG/ACT inhaler Inhale 2 puffs into the lungs every 6 (six) hours as needed for wheezing or shortness of breath. Reported on 08/23/2015    . cetirizine (ZYRTEC) 10 MG tablet Take 10 mg by mouth daily. Reported on 08/23/2015    . fluticasone (FLONASE) 50 MCG/ACT nasal spray Place 1 spray into both nostrils daily.    Marland Kitchen gabapentin (NEURONTIN) 300 MG capsule Take 1 capsule (300 mg total) by mouth at bedtime. 15 capsule 0  . lidocaine-prilocaine (EMLA) cream Apply to affected area once 30 g 3  . LORazepam (ATIVAN) 0.5 MG tablet Take 1 tablet (0.5 mg total) by mouth at bedtime. 30 tablet 1  . montelukast (SINGULAIR) 10 MG tablet TAKE 1 TABLET (10 MG TOTAL) BY MOUTH AT BEDTIME. 30 tablet 3  . ondansetron (ZOFRAN) 8 MG tablet Take 1 tablet (8 mg total) by mouth 2 (two) times daily as needed. Start on the third day after chemotherapy. 30 tablet 1  . prochlorperazine (COMPAZINE) 10 MG tablet Take 1 tablet (10 mg total) by mouth every 6 (six) hours as needed (Nausea or vomiting). 30 tablet 1   No current facility-administered medications for  this visit.   Facility-Administered Medications Ordered in Other Visits  Medication Dose Route Frequency Provider Last Rate Last Dose  . cyclophosphamide (CYTOXAN) 1,020 mg in sodium chloride 0.9 % 250 mL chemo infusion  600 mg/m2 (Treatment Plan Actual) Intravenous Once Nicholas Lose, MD 301 mL/hr at 09/20/15 1315 1,020 mg at 09/20/15 1315  . heparin  lock flush 100 unit/mL  500 Units Intracatheter Once PRN Nicholas Lose, MD      . sodium chloride flush (NS) 0.9 % injection 10 mL  10 mL Intracatheter PRN Nicholas Lose, MD        PHYSICAL EXAMINATION: ECOG PERFORMANCE STATUS: 1 - Symptomatic but completely ambulatory  Filed Vitals:   09/20/15 1154  BP: 144/75  Pulse: 89  Temp: 98.9 F (37.2 C)  Resp: 18   Filed Weights   09/20/15 1154  Weight: 136 lb 14.4 oz (62.097 kg)    GENERAL:alert, no distress and comfortable SKIN: skin color, texture, turgor are normal, no rashes or significant lesions EYES: normal, Conjunctiva are pink and non-injected, sclera clear OROPHARYNX:no exudate, no erythema and lips, buccal mucosa, and tongue normal  NECK: supple, thyroid normal size, non-tender, without nodularity LYMPH:  no palpable lymphadenopathy in the cervical, axillary or inguinal LUNGS: clear to auscultation and percussion with normal breathing effort HEART: regular rate & rhythm and no murmurs and no lower extremity edema ABDOMEN:abdomen soft, non-tender and normal bowel sounds MUSCULOSKELETAL:no cyanosis of digits and no clubbing  NEURO: alert & oriented x 3 with fluent speech, no focal motor/sensory deficits EXTREMITIES: No lower extremity edema  LABORATORY DATA:  I have reviewed the data as listed   Chemistry      Component Value Date/Time   NA 143 09/20/2015 1126   K 3.7 09/20/2015 1126   CO2 28 09/20/2015 1126   BUN 7.2 09/20/2015 1126   CREATININE 0.8 09/20/2015 1126      Component Value Date/Time   CALCIUM 9.3 09/20/2015 1126   ALKPHOS 93 09/20/2015 1126   AST 18 09/20/2015 1126   ALT 23 09/20/2015 1126   BILITOT 0.31 09/20/2015 1126       Lab Results  Component Value Date   WBC 12.2* 09/20/2015   HGB 10.4* 09/20/2015   HCT 31.8* 09/20/2015   MCV 98.1 09/20/2015   PLT 180 09/20/2015   NEUTROABS 9.8* 09/20/2015     ASSESSMENT & PLAN:  Breast cancer of upper-outer quadrant of right female breast  ([facility]) Right mammogram and ultrasound 07/28/2015 Right breast mass in the axillary tail 1.7 x 1.5 x 1.3 cm axillary ultrasound negative, T1c N0 stage IA clinical stage Right breast biopsy 07/11/2015: 10:00 position: Invasive ductal carcinoma grade 3, ER 0%, PR 0%, Ki-67 95%, HER-2 negative ratio 1.21  Treatment plan based on multidisciplinary tumor board: 1. Neoadjuvant chemotherapy with Adriamycin and Cytoxan dose dense 4 followed by Taxol weekly 12 2. Followed by breast conserving surgery with sentinel lymph node study 3. Followed by adjuvant radiation therapy ------------------------------------------------------------------------------------------------------------------------------ Current treatment: Today cycle 4 day 1 of dose dense Adriamycin and Cytoxan Lab work was reviewed Echocardiogram 08/01/2015: EF 65-70% Breast MRI: 07/30/2015: right breast mass 2.4 x 2.1 x 2.1 cm with a preserved fat plane between the mass in the chest wall, 5 mm enhancing mass in the right breast at 12:00 C,biopsy-proven fibroadenoma.  Chemotherapy toxicities: 1. Neutropenia: I suspect that the Neulasta injection did not work. She gets subcutaneous Neulasta injection instead of Onpro because I believe that her neutropenia with cycle 1 is related to malfunctioning  of onpro.   2. Skin rash: Treated with Keflex for 1 week. Resolved 3. Alopecia  No other major side effects to chemotherapy. Denies any nausea or vomiting.  Return to clinic in 2 weeks for cycle 1/12 Taxol (patient's insurance did not approve Abraxane)  No orders of the defined types were placed in this encounter.   The patient has a good understanding of the overall plan. she agrees with it. she will call with any problems that may develop before the next visit here.   Rulon Eisenmenger, MD 09/20/2015

## 2015-09-20 NOTE — Telephone Encounter (Signed)
appt made and avs printed °

## 2015-09-20 NOTE — Assessment & Plan Note (Addendum)
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 Neoadjuvant chemotherapy with Adriamycin and Cytoxan dose dense 4 followed by Taxol weekly 12 2. Followed by breast conserving surgery with sentinel lymph node study 3. Followed by adjuvant radiation therapy ------------------------------------------------------------------------------------------------------------------------------ Current treatment: Today cycle 4 day 1 of dose dense Adriamycin and Cytoxan Lab work was reviewed Echocardiogram 08/01/2015: EF 65-70% Breast MRI: 07/30/2015: right breast mass 2.4 x 2.1 x 2.1 cm with a preserved fat plane between the mass in the chest wall, 5 mm enhancing mass in the right breast at 12:00 C,biopsy-proven fibroadenoma.  Chemotherapy toxicities: 1. Neutropenia: I suspect that the Neulasta injection did not work. She gets subcutaneous Neulasta injection instead of Onpro because I believe that her neutropenia with cycle 1 is related to malfunctioning of onpro.   2. Skin rash: Treated with Keflex for 1 week. Resolved  No other major side effects to chemotherapy. Denies any nausea or vomiting.  Return to clinic in 2 weeks for cycle 1/12 Taxol

## 2015-09-21 ENCOUNTER — Ambulatory Visit (HOSPITAL_BASED_OUTPATIENT_CLINIC_OR_DEPARTMENT_OTHER): Payer: 59

## 2015-09-21 VITALS — BP 135/75 | HR 97 | Temp 99.2°F | Resp 18

## 2015-09-21 DIAGNOSIS — C50411 Malignant neoplasm of upper-outer quadrant of right female breast: Secondary | ICD-10-CM | POA: Diagnosis not present

## 2015-09-21 DIAGNOSIS — D701 Agranulocytosis secondary to cancer chemotherapy: Secondary | ICD-10-CM | POA: Diagnosis not present

## 2015-09-21 MED ORDER — PEGFILGRASTIM INJECTION 6 MG/0.6ML ~~LOC~~
6.0000 mg | PREFILLED_SYRINGE | Freq: Once | SUBCUTANEOUS | Status: AC
  Administered 2015-09-21: 6 mg via SUBCUTANEOUS

## 2015-09-25 ENCOUNTER — Other Ambulatory Visit: Payer: Self-pay | Admitting: Hematology and Oncology

## 2015-09-25 DIAGNOSIS — C50411 Malignant neoplasm of upper-outer quadrant of right female breast: Secondary | ICD-10-CM

## 2015-09-26 ENCOUNTER — Other Ambulatory Visit: Payer: Self-pay | Admitting: *Deleted

## 2015-09-26 DIAGNOSIS — C50411 Malignant neoplasm of upper-outer quadrant of right female breast: Secondary | ICD-10-CM

## 2015-09-26 MED ORDER — GABAPENTIN 300 MG PO CAPS: 300.0000 mg | ORAL_CAPSULE | Freq: Every day | ORAL | Status: DC

## 2015-10-03 ENCOUNTER — Telehealth: Payer: Self-pay | Admitting: *Deleted

## 2015-10-03 NOTE — Telephone Encounter (Signed)
Pt called with c/o burning on the bottoms of her feet and a couple of blisters. Pt relate she stopped taking gabapentin d/t her symptoms were relieved Discussed with pt to start taking gabapentin again and will discuss with Dr. Lindi Adie. Pt relate she is "worried this will not be reversible after treatment". Informed pt to discuss symptoms with Dr. Lindi Adie at her visit tomorrow. Received verbal understanding.

## 2015-10-04 ENCOUNTER — Encounter: Payer: Self-pay | Admitting: Hematology and Oncology

## 2015-10-04 ENCOUNTER — Ambulatory Visit (HOSPITAL_BASED_OUTPATIENT_CLINIC_OR_DEPARTMENT_OTHER): Payer: 59 | Admitting: Hematology and Oncology

## 2015-10-04 ENCOUNTER — Ambulatory Visit (HOSPITAL_BASED_OUTPATIENT_CLINIC_OR_DEPARTMENT_OTHER): Payer: 59 | Admitting: Nurse Practitioner

## 2015-10-04 ENCOUNTER — Encounter: Payer: Self-pay | Admitting: *Deleted

## 2015-10-04 ENCOUNTER — Ambulatory Visit (HOSPITAL_BASED_OUTPATIENT_CLINIC_OR_DEPARTMENT_OTHER): Payer: 59

## 2015-10-04 ENCOUNTER — Other Ambulatory Visit (HOSPITAL_BASED_OUTPATIENT_CLINIC_OR_DEPARTMENT_OTHER): Payer: 59

## 2015-10-04 ENCOUNTER — Telehealth: Payer: Self-pay | Admitting: Hematology and Oncology

## 2015-10-04 ENCOUNTER — Encounter: Payer: Self-pay | Admitting: Nurse Practitioner

## 2015-10-04 VITALS — BP 137/74 | HR 108 | Temp 99.5°F | Resp 18 | Ht 66.0 in | Wt 136.7 lb

## 2015-10-04 VITALS — BP 111/65 | HR 89 | Temp 98.3°F | Resp 16

## 2015-10-04 DIAGNOSIS — T7840XA Allergy, unspecified, initial encounter: Secondary | ICD-10-CM | POA: Insufficient documentation

## 2015-10-04 DIAGNOSIS — C50411 Malignant neoplasm of upper-outer quadrant of right female breast: Secondary | ICD-10-CM | POA: Diagnosis not present

## 2015-10-04 DIAGNOSIS — D701 Agranulocytosis secondary to cancer chemotherapy: Secondary | ICD-10-CM | POA: Diagnosis not present

## 2015-10-04 DIAGNOSIS — Z5111 Encounter for antineoplastic chemotherapy: Secondary | ICD-10-CM | POA: Diagnosis not present

## 2015-10-04 LAB — CBC WITH DIFFERENTIAL/PLATELET
BASO%: 0.3 % (ref 0.0–2.0)
BASOS ABS: 0 10*3/uL (ref 0.0–0.1)
EOS%: 0.3 % (ref 0.0–7.0)
Eosinophils Absolute: 0 10*3/uL (ref 0.0–0.5)
HEMATOCRIT: 30.8 % — AB (ref 34.8–46.6)
HGB: 10.2 g/dL — ABNORMAL LOW (ref 11.6–15.9)
LYMPH#: 0.8 10*3/uL — AB (ref 0.9–3.3)
LYMPH%: 13.2 % — AB (ref 14.0–49.7)
MCH: 33.5 pg (ref 25.1–34.0)
MCHC: 33.2 g/dL (ref 31.5–36.0)
MCV: 100.7 fL (ref 79.5–101.0)
MONO#: 0.7 10*3/uL (ref 0.1–0.9)
MONO%: 11.4 % (ref 0.0–14.0)
NEUT#: 4.8 10*3/uL (ref 1.5–6.5)
NEUT%: 74.8 % (ref 38.4–76.8)
Platelets: 188 10*3/uL (ref 145–400)
RBC: 3.06 10*6/uL — AB (ref 3.70–5.45)
RDW: 19.4 % — ABNORMAL HIGH (ref 11.2–14.5)
WBC: 6.4 10*3/uL (ref 3.9–10.3)

## 2015-10-04 LAB — COMPREHENSIVE METABOLIC PANEL
ALBUMIN: 3.8 g/dL (ref 3.5–5.0)
ALK PHOS: 95 U/L (ref 40–150)
ALT: 23 U/L (ref 0–55)
ANION GAP: 11 meq/L (ref 3–11)
AST: 17 U/L (ref 5–34)
BILIRUBIN TOTAL: 0.41 mg/dL (ref 0.20–1.20)
BUN: 8.9 mg/dL (ref 7.0–26.0)
CO2: 25 mEq/L (ref 22–29)
Calcium: 9.5 mg/dL (ref 8.4–10.4)
Chloride: 108 mEq/L (ref 98–109)
Creatinine: 0.8 mg/dL (ref 0.6–1.1)
EGFR: 89 mL/min/{1.73_m2} — AB (ref >90)
GLUCOSE: 90 mg/dL (ref 70–140)
POTASSIUM: 4.6 meq/L (ref 3.5–5.1)
SODIUM: 144 meq/L (ref 136–145)
TOTAL PROTEIN: 6.9 g/dL (ref 6.4–8.3)

## 2015-10-04 MED ORDER — METHYLPREDNISOLONE SODIUM SUCC 125 MG IJ SOLR
60.0000 mg | Freq: Once | INTRAMUSCULAR | Status: AC
  Administered 2015-10-04: 60 mg via INTRAVENOUS

## 2015-10-04 MED ORDER — SODIUM CHLORIDE 0.9% FLUSH
10.0000 mL | INTRAVENOUS | Status: DC | PRN
  Administered 2015-10-04: 10 mL
  Filled 2015-10-04: qty 10

## 2015-10-04 MED ORDER — DIPHENHYDRAMINE HCL 50 MG/ML IJ SOLN
INTRAMUSCULAR | Status: AC
  Filled 2015-10-04: qty 1

## 2015-10-04 MED ORDER — SODIUM CHLORIDE 0.9 % IV SOLN
80.0000 mg/m2 | Freq: Once | INTRAVENOUS | Status: AC
  Administered 2015-10-04: 138 mg via INTRAVENOUS
  Filled 2015-10-04: qty 23

## 2015-10-04 MED ORDER — FAMOTIDINE IN NACL 20-0.9 MG/50ML-% IV SOLN
INTRAVENOUS | Status: AC
  Filled 2015-10-04: qty 50

## 2015-10-04 MED ORDER — SODIUM CHLORIDE 0.9 % IV SOLN
Freq: Once | INTRAVENOUS | Status: AC
  Administered 2015-10-04: 09:00:00 via INTRAVENOUS

## 2015-10-04 MED ORDER — HEPARIN SOD (PORK) LOCK FLUSH 100 UNIT/ML IV SOLN
500.0000 [IU] | Freq: Once | INTRAVENOUS | Status: AC | PRN
  Administered 2015-10-04: 500 [IU]
  Filled 2015-10-04: qty 5

## 2015-10-04 MED ORDER — SODIUM CHLORIDE 0.9 % IV SOLN
Freq: Once | INTRAVENOUS | Status: AC
  Administered 2015-10-04: 10:00:00 via INTRAVENOUS
  Filled 2015-10-04: qty 4

## 2015-10-04 MED ORDER — FAMOTIDINE IN NACL 20-0.9 MG/50ML-% IV SOLN
20.0000 mg | Freq: Once | INTRAVENOUS | Status: AC
  Administered 2015-10-04: 20 mg via INTRAVENOUS

## 2015-10-04 MED ORDER — DIPHENHYDRAMINE HCL 50 MG/ML IJ SOLN
25.0000 mg | Freq: Once | INTRAMUSCULAR | Status: AC
  Administered 2015-10-04: 25 mg via INTRAVENOUS

## 2015-10-04 NOTE — Progress Notes (Signed)
Patient reassessed prior to discharge.  Instructions on use of Benadryl & Pepcid given to patient by Selena Lesser, NP.  No further complaints of questions at this time.

## 2015-10-04 NOTE — Telephone Encounter (Signed)
appt made and avs printed °

## 2015-10-04 NOTE — Assessment & Plan Note (Signed)
Patient presented to the [place] today to receive cycle one of her weekly Taxol chemotherapy regimen.  She experienced a very mild hypersensitivity reaction; which was managed per protocol.  Patient was able to complete all of her chemotherapy today as planned.  She is scheduled to return on 10/11/2015 for labs and her next cycle of chemotherapy.

## 2015-10-04 NOTE — Assessment & Plan Note (Signed)
Patient presented to the [facility] today to receive cycle one of her weekly Taxol chemotherapy regimen.  She developed some mild numbness and tingling to her right hand only.  Taxol infusion was held; and patient was given Solu-Medrol 60 mg IV.  Confirmed the patient was premedicated with Benadryl 50 mg, Pepcid, and Zofran.  All symptoms completely resolved; patient was able to complete her chemotherapy today as planned.  Patient was discharged home with family this afternoon.  She was given written and verbal instructions to use Benadryl/Pepcid on an as-needed basis for mild reactions.  She was also encouraged her directly to the emergency department for any worsening symptoms whatsoever.

## 2015-10-04 NOTE — Progress Notes (Signed)
Patient Care Team: Vania Rea, MD as PCP - General (Obstetrics and Gynecology)  DIAGNOSIS: Breast cancer of upper-outer quadrant of right female breast [facility])   Staging form: Breast, AJCC 7th Edition     Clinical stage from 07/24/2015: Stage IA (T1c, N0, M0) - Unsigned   SUMMARY OF ONCOLOGIC HISTORY:   Breast cancer of upper-outer quadrant of right female breast ([place])   07/11/2015 Mammogram Right breast mass in the axillary tail 1.7 x 1.5 x 1.3 cm axillary ultrasound negative, T1c N0 stage IA clinical stage   07/11/2015 Initial Diagnosis Right breast biopsy 10:00 position: Invasive ductal carcinoma grade 3, ER 0%, P of 0%, Ki-67 95%, HER-2 negative ratio 1.21; right breast biopsy 12:00: Fibrocystic changes   08/02/2015 -  Neo-Adjuvant Chemotherapy Dose dense Adriamycin and Cytoxan 4 followed by Taxol weekly 12    CHIEF COMPLIANT: Cycle 1 Taxol  INTERVAL HISTORY: Courtney Gomez is a 50 year old with above-mentioned history of right breast cancer currently on neoadjuvant chemotherapy and today cycle 1 of Taxol. Patient has been complaining of soreness in blisters in the bottom of the feet.   REVIEW OF SYSTEMS:   Constitutional: Denies fevers, chills or abnormal weight loss Eyes: Denies blurriness of vision Ears, nose, mouth, throat, and face: Denies mucositis or sore throat Respiratory: Denies cough, dyspnea or wheezes Cardiovascular: Denies palpitation, chest discomfort Gastrointestinal:  Denies nausea, heartburn or change in bowel habits Skin: Denies abnormal skin rashes Lymphatics: Denies new lymphadenopathy or easy bruising Neurological:Denies numbness, tingling or new weaknesses Behavioral/Psych: Mood is stable, no new changes  Extremities: No lower extremity edema, Skin blisters between toes  All other systems were reviewed with the patient and are negative.  I have reviewed the past medical history, past surgical history, social history and family history with the patient  and they are unchanged from previous note.  ALLERGIES:  is allergic to lanolin and latex.  MEDICATIONS:  Current Outpatient Prescriptions  Medication Sig Dispense Refill  . ADVAIR DISKUS 250-50 MCG/DOSE AEPB Inhale 1 puff into the lungs daily.   5  . albuterol (PROAIR HFA) 108 (90 BASE) MCG/ACT inhaler Inhale 2 puffs into the lungs every 6 (six) hours as needed for wheezing or shortness of breath. Reported on 08/23/2015    . cetirizine (ZYRTEC) 10 MG tablet Take 10 mg by mouth daily. Reported on 08/23/2015    . fluticasone (FLONASE) 50 MCG/ACT nasal spray Place 1 spray into both nostrils daily.    Marland Kitchen gabapentin (NEURONTIN) 300 MG capsule Take 1 capsule (300 mg total) by mouth at bedtime. 30 capsule 3  . LORazepam (ATIVAN) 0.5 MG tablet Take 1 tablet (0.5 mg total) by mouth at bedtime. 30 tablet 1  . montelukast (SINGULAIR) 10 MG tablet TAKE 1 TABLET (10 MG TOTAL) BY MOUTH AT BEDTIME. 30 tablet 3   No current facility-administered medications for this visit.    PHYSICAL EXAMINATION: ECOG PERFORMANCE STATUS: 1 - Symptomatic but completely ambulatory  Filed Vitals:   10/04/15 0814  BP: 137/74  Pulse: 108  Temp: 99.5 F (37.5 C)  Resp: 18   Filed Weights   10/04/15 0814  Weight: 136 lb 11.2 oz (62.007 kg)    GENERAL:alert, no distress and comfortable SKIN: skin color, texture, turgor are normal, no rashes or significant lesions EYES: normal, Conjunctiva are pink and non-injected, sclera clear OROPHARYNX:no exudate, no erythema and lips, buccal mucosa, and tongue normal  NECK: supple, thyroid normal size, non-tender, without nodularity LYMPH:  no palpable lymphadenopathy in the cervical,  axillary or inguinal LUNGS: clear to auscultation and percussion with normal breathing effort HEART: regular rate & rhythm and no murmurs and no lower extremity edema ABDOMEN:abdomen soft, non-tender and normal bowel sounds MUSCULOSKELETAL:no cyanosis of digits and no clubbing  NEURO: alert &  oriented x 3 with fluent speech, no focal motor/sensory deficits EXTREMITIES: No lower extremity edema  LABORATORY DATA:  I have reviewed the data as listed   Chemistry      Component Value Date/Time   NA 143 09/20/2015 1126   K 3.7 09/20/2015 1126   CO2 28 09/20/2015 1126   BUN 7.2 09/20/2015 1126   CREATININE 0.8 09/20/2015 1126      Component Value Date/Time   CALCIUM 9.3 09/20/2015 1126   ALKPHOS 93 09/20/2015 1126   AST 18 09/20/2015 1126   ALT 23 09/20/2015 1126   BILITOT 0.31 09/20/2015 1126       Lab Results  Component Value Date   WBC 6.4 10/04/2015   HGB 10.2* 10/04/2015   HCT 30.8* 10/04/2015   MCV 100.7 10/04/2015   PLT 188 10/04/2015   NEUTROABS 4.8 10/04/2015     ASSESSMENT & PLAN:  Breast cancer of upper-outer quadrant of right female breast ([place]) Right mammogram and ultrasound 07/28/2015 Right breast mass in the axillary tail 1.7 x 1.5 x 1.3 cm axillary ultrasound negative, T1c N0 stage IA clinical stage Right breast biopsy 07/11/2015: 10:00 position: Invasive ductal carcinoma grade 3, ER 0%, PR 0%, Ki-67 95%, HER-2 negative ratio 1.21  Treatment plan based on multidisciplinary tumor board: 1. Neoadjuvant chemotherapy with Adriamycin and Cytoxan dose dense 4 followed by Taxol weekly 12 2. Followed by breast conserving surgery with sentinel lymph node study 3. Followed by adjuvant radiation therapy ------------------------------------------------------------------------------------------------------------------------------ Current treatment: Completed 4 cycles of dose dense Adriamycin and Cytoxan, today is cycle 1/12 Taxol  (patient's insurance did not approve Abraxane) Lab work was reviewed Echocardiogram 08/01/2015: EF 65-70% Breast MRI: 07/30/2015: right breast mass 2.4 x 2.1 x 2.1 cm with a preserved fat plane between the mass in the chest wall, 5 mm enhancing mass in the right breast at 12:00 C,biopsy-proven fibroadenoma.  Chemotherapy  toxicities: 1. Neutropenia: I suspect that the Neulasta injection did not work. She gets subcutaneous Neulasta injection instead of Onpro because I believe that her neutropenia with cycle 1 is related to malfunctioning of onpro.  2. Skin rash: Treated with Keflex for 1 week. Resolved 3. Alopecia 4. Blisters underneath the feet: Due to Cytoxan, they're slowly improving. I encouraged her to continue with Neurontin 350m 3 times a day.  Denies any nausea or vomiting. We discussed the risks of Taxol including the risk of allergic reactions. Return to clinic in 1 weeks for cycle 2/12 Taxol   No orders of the defined types were placed in this encounter.   The patient has a good understanding of the overall plan. she agrees with it. she will call with any problems that may develop before the next visit here.   GRulon Eisenmenger MD 10/04/2015

## 2015-10-04 NOTE — Assessment & Plan Note (Signed)
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 Neoadjuvant chemotherapy with Adriamycin and Cytoxan dose dense 4 followed by Taxol weekly 12 2. Followed by breast conserving surgery with sentinel lymph node study 3. Followed by adjuvant radiation therapy ------------------------------------------------------------------------------------------------------------------------------ Current treatment: Completed 4 cycles of dose dense Adriamycin and Cytoxan, today is cycle 1/12 Taxol  (patient's insurance did not approve Abraxane) Lab work was reviewed Echocardiogram 08/01/2015: EF 65-70% Breast MRI: 07/30/2015: right breast mass 2.4 x 2.1 x 2.1 cm with a preserved fat plane between the mass in the chest wall, 5 mm enhancing mass in the right breast at 12:00 C,biopsy-proven fibroadenoma.  Chemotherapy toxicities: 1. Neutropenia: I suspect that the Neulasta injection did not work. She gets subcutaneous Neulasta injection instead of Onpro because I believe that her neutropenia with cycle 1 is related to malfunctioning of onpro.  2. Skin rash: Treated with Keflex for 1 week. Resolved 3. Alopecia 4. Blisters underneath the feet: Due to Cytoxan  Denies any nausea or vomiting.  Return to clinic in 1 weeks for cycle 2/12 Taxol

## 2015-10-04 NOTE — Progress Notes (Signed)
SYMPTOM MANAGEMENT CLINIC    Chief Complaint: Hypersensitivity reaction  HPI:  Courtney Gomez 50 y.o. female diagnosed with breast cancer.  Currently undergoing weekly Taxol chemotherapy.     Breast cancer of upper-outer quadrant of right female breast ([place])   07/11/2015 Mammogram Right breast mass in the axillary tail 1.7 x 1.5 x 1.3 cm axillary ultrasound negative, T1c N0 stage IA clinical stage   07/11/2015 Initial Diagnosis Right breast biopsy 10:00 position: Invasive ductal carcinoma grade 3, ER 0%, P of 0%, Ki-67 95%, HER-2 negative ratio 1.21; right breast biopsy 12:00: Fibrocystic changes   08/02/2015 -  Neo-Adjuvant Chemotherapy Dose dense Adriamycin and Cytoxan 4 followed by Taxol weekly 12    Review of Systems  Neurological: Positive for tingling.  All other systems reviewed and are negative.   Past Medical History  Diagnosis Date  . Asthma   . Breast cancer of upper-outer quadrant of right female breast ([name]) 07/15/2015  . History of motion sickness   . Eczema   . Anxiety     not currently    Past Surgical History  Procedure Laterality Date  . Dental surgery  1982    impacted teeth  . Portacath placement Right 07/31/2015    Procedure: INSERTION PORT-A-CATH WITH ULTRASOUND ;  Surgeon: Rolm Bookbinder, MD;  Location: [facility];  Service: General;  Laterality: Right;    has ECZEMA, ATOPIC DERMATITIS; HIP PAIN, BILATERAL; Moderate persistent asthma; Allergic rhinitis due to pollen; Breast cancer of upper-outer quadrant of right female breast ([name]); Genetic testing; and Hypersensitivity reaction on her problem list.    is allergic to lanolin and latex.    Medication List       This list is accurate as of: 10/04/15 12:33 PM.  Always use your most recent med list.               ADVAIR DISKUS 250-50 MCG/DOSE Aepb  Generic drug:  Fluticasone-Salmeterol  Inhale 1 puff into the lungs daily.     cetirizine 10 MG tablet  Commonly known as:  ZYRTEC  Take 10 mg by  mouth daily. Reported on 08/23/2015     fluticasone 50 MCG/ACT nasal spray  Commonly known as:  FLONASE  Place 1 spray into both nostrils daily.     gabapentin 300 MG capsule  Commonly known as:  NEURONTIN  Take 1 capsule (300 mg total) by mouth at bedtime.     LORazepam 0.5 MG tablet  Commonly known as:  ATIVAN  Take 1 tablet (0.5 mg total) by mouth at bedtime.     montelukast 10 MG tablet  Commonly known as:  SINGULAIR  TAKE 1 TABLET (10 MG TOTAL) BY MOUTH AT BEDTIME.     PROAIR HFA 108 (90 Base) MCG/ACT inhaler  Generic drug:  albuterol  Inhale 2 puffs into the lungs every 6 (six) hours as needed for wheezing or shortness of breath. Reported on 08/23/2015         PHYSICAL EXAMINATION  Oncology Vitals 10/04/2015 10/04/2015  Temp 98.3 98  Pulse 89 93  Resp 16 18  SpO2 100 98   BP Readings from Last 2 Encounters:  10/04/15 111/65  10/04/15 137/74    Physical Exam  Constitutional: She is oriented to person, place, and time and well-developed, well-nourished, and in no distress.  HENT:  Head: Normocephalic and atraumatic.  Eyes: Conjunctivae and EOM are normal. Pupils are equal, round, and reactive to light. Right eye exhibits no discharge. Left eye exhibits no  discharge. No scleral icterus.  Neck: Normal range of motion.  Pulmonary/Chest: Effort normal. No respiratory distress.  Musculoskeletal: Normal range of motion. She exhibits no edema or tenderness.  Neurological: She is alert and oriented to person, place, and time.  Skin: Skin is warm and dry. No rash noted. No erythema. No pallor.  Psychiatric: Affect normal.  Nursing note and vitals reviewed.   LABORATORY DATA:. Appointment on 10/04/2015  Component Date Value Ref Range Status  . WBC 10/04/2015 6.4  3.9 - 10.3 10e3/uL Final  . NEUT# 10/04/2015 4.8  1.5 - 6.5 10e3/uL Final  . HGB 10/04/2015 10.2* 11.6 - 15.9 g/dL Final  . HCT 10/04/2015 30.8* 34.8 - 46.6 % Final  . Platelets 10/04/2015 188  145 - 400  10e3/uL Final  . MCV 10/04/2015 100.7  79.5 - 101.0 fL Final  . MCH 10/04/2015 33.5  25.1 - 34.0 pg Final  . MCHC 10/04/2015 33.2  31.5 - 36.0 g/dL Final  . RBC 10/04/2015 3.06* 3.70 - 5.45 10e6/uL Final  . RDW 10/04/2015 19.4* 11.2 - 14.5 % Final  . lymph# 10/04/2015 0.8* 0.9 - 3.3 10e3/uL Final  . MONO# 10/04/2015 0.7  0.1 - 0.9 10e3/uL Final  . Eosinophils Absolute 10/04/2015 0.0  0.0 - 0.5 10e3/uL Final  . Basophils Absolute 10/04/2015 0.0  0.0 - 0.1 10e3/uL Final  . NEUT% 10/04/2015 74.8  38.4 - 76.8 % Final  . LYMPH% 10/04/2015 13.2* 14.0 - 49.7 % Final  . MONO% 10/04/2015 11.4  0.0 - 14.0 % Final  . EOS% 10/04/2015 0.3  0.0 - 7.0 % Final  . BASO% 10/04/2015 0.3  0.0 - 2.0 % Final  . Sodium 10/04/2015 144  136 - 145 mEq/L Final  . Potassium 10/04/2015 4.6  3.5 - 5.1 mEq/L Final  . Chloride 10/04/2015 108  98 - 109 mEq/L Final  . CO2 10/04/2015 25  22 - 29 mEq/L Final  . Glucose 10/04/2015 90  70 - 140 mg/dl Final   Glucose reference range is for nonfasting patients. Fasting glucose reference range is 70- 100.  Marland Kitchen BUN 10/04/2015 8.9  7.0 - 26.0 mg/dL Final  . Creatinine 10/04/2015 0.8  0.6 - 1.1 mg/dL Final  . Total Bilirubin 10/04/2015 0.41  0.20 - 1.20 mg/dL Final  . Alkaline Phosphatase 10/04/2015 95  40 - 150 U/L Final  . AST 10/04/2015 17  5 - 34 U/L Final  . ALT 10/04/2015 23  0 - 55 U/L Final  . Total Protein 10/04/2015 6.9  6.4 - 8.3 g/dL Final  . Albumin 10/04/2015 3.8  3.5 - 5.0 g/dL Final  . Calcium 10/04/2015 9.5  8.4 - 10.4 mg/dL Final  . Anion Gap 10/04/2015 11  3 - 11 mEq/L Final  . EGFR 10/04/2015 89* >90 ml/min/1.73 m2 Final   eGFR is calculated using the CKD-EPI Creatinine Equation (2009)    RADIOGRAPHIC STUDIES: No results found.  ASSESSMENT/PLAN:    Hypersensitivity reaction Patient presented to the [facility] today to receive cycle one of her weekly Taxol chemotherapy regimen.  She developed some mild numbness and tingling to her right hand only.   Taxol infusion was held; and patient was given Solu-Medrol 60 mg IV.  Confirmed the patient was premedicated with Benadryl 50 mg, Pepcid, and Zofran.  All symptoms completely resolved; patient was able to complete her chemotherapy today as planned.  Patient was discharged home with family this afternoon.  She was given written and verbal instructions to use Benadryl/Pepcid on an as-needed  basis for mild reactions.  She was also encouraged her directly to the emergency department for any worsening symptoms whatsoever.  Breast cancer of upper-outer quadrant of right female breast [facility]) Patient presented to the [facility] today to receive cycle one of her weekly Taxol chemotherapy regimen.  She experienced a very mild hypersensitivity reaction; which was managed per protocol.  Patient was able to complete all of her chemotherapy today as planned.  She is scheduled to return on 10/11/2015 for labs and her next cycle of chemotherapy.   Patient stated understanding of all instructions; and was in agreement with this plan of care. The patient knows to call the clinic with any problems, questions or concerns.   Total time spent with patient was 25 minutes;  with greater than 75 percent of that time spent in face to face counseling regarding patient's symptoms,  and coordination of care and follow up.  Disclaimer:This dictation was prepared with Dragon/digital dictation along with [employer]. Any transcriptional errors that result from this process are unintentional.  Drue Second, NP 10/04/2015

## 2015-10-04 NOTE — Patient Instructions (Addendum)
[facility] Discharge Instructions for Patients Receiving Chemotherapy  Today you received the following chemotherapy agents Taxol  To help prevent nausea and vomiting after your treatment, we encourage you to take your nausea medication Zofran  If you develop nausea and vomiting that is not controlled by your nausea medication, call the clinic.   BELOW ARE SYMPTOMS THAT SHOULD BE REPORTED IMMEDIATELY:  *FEVER GREATER THAN 100.5 F  *CHILLS WITH OR WITHOUT FEVER  NAUSEA AND VOMITING THAT IS NOT CONTROLLED WITH YOUR NAUSEA MEDICATION  *UNUSUAL SHORTNESS OF BREATH  *UNUSUAL BRUISING OR BLEEDING  TENDERNESS IN MOUTH AND THROAT WITH OR WITHOUT PRESENCE OF ULCERS  *URINARY PROBLEMS  *BOWEL PROBLEMS  UNUSUAL RASH Items with * indicate a potential emergency and should be followed up as soon as possible.  Feel free to call the clinic you have any questions or concerns. The clinic phone number is [phone].  Please show the [facility] at check-in to the Emergency Department and triage nurse.  Paclitaxel injection What is this medicine? PACLITAXEL (PAK li TAX el) is a chemotherapy drug. It targets fast dividing cells, like cancer cells, and causes these cells to die. This medicine is used to treat ovarian cancer, breast cancer, and other cancers. This medicine may be used for other purposes; ask your health care provider or pharmacist if you have questions. What should I tell my health care provider before I take this medicine? They need to know if you have any of these conditions: -blood disorders -irregular heartbeat -infection (especially a virus infection such as chickenpox, cold sores, or herpes) -liver disease -previous or ongoing radiation therapy -an unusual or allergic reaction to paclitaxel, alcohol, polyoxyethylated castor oil, other chemotherapy agents, other medicines, foods, dyes, or preservatives -pregnant or trying to get  pregnant -breast-feeding How should I use this medicine? This drug is given as an infusion into a vein. It is administered in a hospital or clinic by a specially trained health care professional. Talk to your pediatrician regarding the use of this medicine in children. Special care may be needed. Overdosage: If you think you have taken too much of this medicine contact a poison control center or emergency room at once. NOTE: This medicine is only for you. Do not share this medicine with others. What if I miss a dose? It is important not to miss your dose. Call your doctor or health care professional if you are unable to keep an appointment. What may interact with this medicine? Do not take this medicine with any of the following medications: -disulfiram -metronidazole This medicine may also interact with the following medications: -cyclosporine -diazepam -ketoconazole -medicines to increase blood counts like filgrastim, pegfilgrastim, sargramostim -other chemotherapy drugs like cisplatin, doxorubicin, epirubicin, etoposide, teniposide, vincristine -quinidine -testosterone -vaccines -verapamil Talk to your doctor or health care professional before taking any of these medicines: -acetaminophen -aspirin -ibuprofen -ketoprofen -naproxen This list may not describe all possible interactions. Give your health care provider a list of all the medicines, herbs, non-prescription drugs, or dietary supplements you use. Also tell them if you smoke, drink alcohol, or use illegal drugs. Some items may interact with your medicine. What should I watch for while using this medicine? Your condition will be monitored carefully while you are receiving this medicine. You will need important blood work done while you are taking this medicine. This drug may make you feel generally unwell. This is not uncommon, as chemotherapy can affect healthy cells as well as cancer cells. Report  any side effects. Continue  your course of treatment even though you feel ill unless your doctor tells you to stop. This medicine can cause serious allergic reactions. To reduce your risk you will need to take other medicine(s) before treatment with this medicine. In some cases, you may be given additional medicines to help with side effects. Follow all directions for their use. Call your doctor or health care professional for advice if you get a fever, chills or sore throat, or other symptoms of a cold or flu. Do not treat yourself. This drug decreases your body's ability to fight infections. Try to avoid being around people who are sick. This medicine may increase your risk to bruise or bleed. Call your doctor or health care professional if you notice any unusual bleeding. Be careful brushing and flossing your teeth or using a toothpick because you may get an infection or bleed more easily. If you have any dental work done, tell your dentist you are receiving this medicine. Avoid taking products that contain aspirin, acetaminophen, ibuprofen, naproxen, or ketoprofen unless instructed by your doctor. These medicines may hide a fever. Do not become pregnant while taking this medicine. Women should inform their doctor if they wish to become pregnant or think they might be pregnant. There is a potential for serious side effects to an unborn child. Talk to your health care professional or pharmacist for more information. Do not breast-feed an infant while taking this medicine. Men are advised not to father a child while receiving this medicine. This product may contain alcohol. Ask your pharmacist or healthcare provider if this medicine contains alcohol. Be sure to tell all healthcare providers you are taking this medicine. Certain medicines, like metronidazole and disulfiram, can cause an unpleasant reaction when taken with alcohol. The reaction includes flushing, headache, nausea, vomiting, sweating, and increased thirst. The reaction  can last from 30 minutes to several hours. What side effects may I notice from receiving this medicine? Side effects that you should report to your doctor or health care professional as soon as possible: -allergic reactions like skin rash, itching or hives, swelling of the face, lips, or tongue -low blood counts - This drug may decrease the number of white blood cells, red blood cells and platelets. You may be at increased risk for infections and bleeding. -signs of infection - fever or chills, cough, sore throat, pain or difficulty passing urine -signs of decreased platelets or bleeding - bruising, pinpoint red spots on the skin, black, tarry stools, nosebleeds -signs of decreased red blood cells - unusually weak or tired, fainting spells, lightheadedness -breathing problems -chest pain -high or low blood pressure -mouth sores -nausea and vomiting -pain, swelling, redness or irritation at the injection site -pain, tingling, numbness in the hands or feet -slow or irregular heartbeat -swelling of the ankle, feet, hands Side effects that usually do not require medical attention (report to your doctor or health care professional if they continue or are bothersome): -bone pain -complete hair loss including hair on your head, underarms, pubic hair, eyebrows, and eyelashes -changes in the color of fingernails -diarrhea -loosening of the fingernails -loss of appetite -muscle or joint pain -red flush to skin -sweating This list may not describe all possible side effects. Call your doctor for medical advice about side effects. You may report side effects to FDA at 1-800-FDA-1088. Where should I keep my medicine? This drug is given in a hospital or clinic and will not be stored at home. NOTE: This sheet  is a summary. It may not cover all possible information. If you have questions about this medicine, talk to your doctor, pharmacist, or health care provider.    2016, Elsevier/Gold Standard.  (2014-11-01 13:02:56)

## 2015-10-04 NOTE — Progress Notes (Signed)
Patient reported numbness and tingling in right hand and arm @ 1130.  Immediately stopped transfusion and called Selena Lesser NP.  Selena Lesser, NP at chairside to evaluate.  Ordered Solumedrol 60 mg IV and order to restart Taxol and monitor for 30 minutes following completion.  Selena Lesser NP to evaluate patient prior to discharge.

## 2015-10-07 ENCOUNTER — Encounter: Payer: Self-pay | Admitting: *Deleted

## 2015-10-07 ENCOUNTER — Telehealth: Payer: Self-pay

## 2015-10-07 ENCOUNTER — Telehealth: Payer: Self-pay | Admitting: Hematology and Oncology

## 2015-10-07 ENCOUNTER — Other Ambulatory Visit: Payer: Self-pay | Admitting: Hematology and Oncology

## 2015-10-07 DIAGNOSIS — C50411 Malignant neoplasm of upper-outer quadrant of right female breast: Secondary | ICD-10-CM

## 2015-10-07 MED ORDER — GABAPENTIN 300 MG PO CAPS: 600.0000 mg | ORAL_CAPSULE | Freq: Three times a day (TID) | ORAL | Status: DC

## 2015-10-07 NOTE — Telephone Encounter (Signed)
[facility] - Calling to check on patient after 1st chemo Friday.  Pt to call clinic with any problems or questions.

## 2015-10-07 NOTE — Telephone Encounter (Signed)
Patient called in complaining of severe rash and pain in both her feet after the first dose of Taxol. We discussed the patient that we would like to switch her treatment from Taxol to Abraxane. I will send a request to her prior authorization department to get her Abraxane improved. We increased the dosage of Neurontin to 600 mg by mouth 3 times a day.

## 2015-10-07 NOTE — Progress Notes (Signed)
Pt called with increase in "burning and stinging" in her feet. Per Dr. Lindi Adie increase gabapentin to 600mg  TID. Pt to be seen on 7/13 for lab and Dr. Lindi Adie to discuss change in chemotherapy for 7/14. Schedule and confirm appt date and time. Denies further needs at this time.

## 2015-10-10 ENCOUNTER — Other Ambulatory Visit (HOSPITAL_BASED_OUTPATIENT_CLINIC_OR_DEPARTMENT_OTHER): Payer: 59

## 2015-10-10 ENCOUNTER — Ambulatory Visit (HOSPITAL_BASED_OUTPATIENT_CLINIC_OR_DEPARTMENT_OTHER): Payer: 59 | Admitting: Hematology and Oncology

## 2015-10-10 ENCOUNTER — Encounter: Payer: Self-pay | Admitting: Hematology and Oncology

## 2015-10-10 ENCOUNTER — Encounter: Payer: Self-pay | Admitting: *Deleted

## 2015-10-10 VITALS — BP 127/62 | HR 99 | Temp 98.8°F | Resp 18 | Ht 65.0 in | Wt 139.6 lb

## 2015-10-10 DIAGNOSIS — L270 Generalized skin eruption due to drugs and medicaments taken internally: Secondary | ICD-10-CM

## 2015-10-10 DIAGNOSIS — D701 Agranulocytosis secondary to cancer chemotherapy: Secondary | ICD-10-CM

## 2015-10-10 DIAGNOSIS — C50411 Malignant neoplasm of upper-outer quadrant of right female breast: Secondary | ICD-10-CM | POA: Diagnosis not present

## 2015-10-10 DIAGNOSIS — T7840XA Allergy, unspecified, initial encounter: Secondary | ICD-10-CM

## 2015-10-10 LAB — COMPREHENSIVE METABOLIC PANEL
ALBUMIN: 3.9 g/dL (ref 3.5–5.0)
ALK PHOS: 76 U/L (ref 40–150)
ALT: 70 U/L — AB (ref 0–55)
ANION GAP: 8 meq/L (ref 3–11)
AST: 35 U/L — ABNORMAL HIGH (ref 5–34)
BILIRUBIN TOTAL: 0.45 mg/dL (ref 0.20–1.20)
BUN: 10.5 mg/dL (ref 7.0–26.0)
CO2: 27 meq/L (ref 22–29)
CREATININE: 0.7 mg/dL (ref 0.6–1.1)
Calcium: 9.1 mg/dL (ref 8.4–10.4)
Chloride: 107 mEq/L (ref 98–109)
GLUCOSE: 108 mg/dL (ref 70–140)
Potassium: 3.6 mEq/L (ref 3.5–5.1)
SODIUM: 142 meq/L (ref 136–145)
TOTAL PROTEIN: 7 g/dL (ref 6.4–8.3)

## 2015-10-10 LAB — CBC WITH DIFFERENTIAL/PLATELET
BASO%: 0.6 % (ref 0.0–2.0)
Basophils Absolute: 0 10*3/uL (ref 0.0–0.1)
EOS%: 0.2 % (ref 0.0–7.0)
Eosinophils Absolute: 0 10*3/uL (ref 0.0–0.5)
HCT: 28.9 % — ABNORMAL LOW (ref 34.8–46.6)
HGB: 9.5 g/dL — ABNORMAL LOW (ref 11.6–15.9)
LYMPH#: 0.8 10*3/uL — AB (ref 0.9–3.3)
LYMPH%: 15 % (ref 14.0–49.7)
MCH: 33.4 pg (ref 25.1–34.0)
MCHC: 32.9 g/dL (ref 31.5–36.0)
MCV: 101.4 fL — AB (ref 79.5–101.0)
MONO#: 0.5 10*3/uL (ref 0.1–0.9)
MONO%: 8.8 % (ref 0.0–14.0)
NEUT#: 3.9 10*3/uL (ref 1.5–6.5)
NEUT%: 75.4 % (ref 38.4–76.8)
PLATELETS: 299 10*3/uL (ref 145–400)
RBC: 2.85 10*6/uL — AB (ref 3.70–5.45)
RDW: 18.3 % — AB (ref 11.2–14.5)
WBC: 5.2 10*3/uL (ref 3.9–10.3)

## 2015-10-10 NOTE — Progress Notes (Signed)
Patient Care Team: Vania Rea, MD as PCP - General (Obstetrics and Gynecology)  DIAGNOSIS: Breast cancer of upper-outer quadrant of right female breast [facility])   Staging form: Breast, AJCC 7th Edition     Clinical stage from 07/24/2015: Stage IA (T1c, N0, M0) - Unsigned   SUMMARY OF ONCOLOGIC HISTORY:   Breast cancer of upper-outer quadrant of right female breast ([place])   07/11/2015 Mammogram Right breast mass in the axillary tail 1.7 x 1.5 x 1.3 cm axillary ultrasound negative, T1c N0 stage IA clinical stage   07/11/2015 Initial Diagnosis Right breast biopsy 10:00 position: Invasive ductal carcinoma grade 3, ER 0%, P of 0%, Ki-67 95%, HER-2 negative ratio 1.21; right breast biopsy 12:00: Fibrocystic changes   08/02/2015 -  Neo-Adjuvant Chemotherapy Dose dense Adriamycin and Cytoxan 4 followed by Taxol weekly 12    CHIEF COMPLIANT: Tomorrow is cycle 2 of Abraxane   INTERVAL HISTORY: Courtney Gomez is a 50 year old with above-mentioned history of right breast cancer who completed 4 cycles of dose dense Adriamycin Cytoxan neoadjuvant chemotherapy and had received first dose of Taxol and had a profound reaction where her feet became extremely sore with blisters. She is here today to discuss change in treatment from Taxol to Abraxane. Her feet have improved over the past couple of days with increasing the Neurontin to 600 mg by mouth 3 times a day. If she takes less of the Neurontin her symptoms appear to be getting worse. She was unable to walk on her feet because of this symptom.  REVIEW OF SYSTEMS:   Constitutional: Denies fevers, chills or abnormal weight loss Eyes: Denies blurriness of vision Ears, nose, mouth, throat, and face: Denies mucositis or sore throat Respiratory: Denies cough, dyspnea or wheezes Cardiovascular: Denies palpitation, chest discomfort Gastrointestinal:  Denies nausea, heartburn or change in bowel habits Skin: Denies abnormal skin rashes Lymphatics: Denies new  lymphadenopathy or easy bruising Neurological:Denies numbness, tingling or new weaknesses Behavioral/Psych: Mood is stable, no new changes  Extremities: Blisters between the toes, redness in the toes All other systems were reviewed with the patient and are negative.  I have reviewed the past medical history, past surgical history, social history and family history with the patient and they are unchanged from previous note.  ALLERGIES:  is allergic to lanolin and latex.  MEDICATIONS:  Current Outpatient Prescriptions  Medication Sig Dispense Refill  . ADVAIR DISKUS 250-50 MCG/DOSE AEPB Inhale 1 puff into the lungs daily.   5  . albuterol (PROAIR HFA) 108 (90 BASE) MCG/ACT inhaler Inhale 2 puffs into the lungs every 6 (six) hours as needed for wheezing or shortness of breath. Reported on 08/23/2015    . cetirizine (ZYRTEC) 10 MG tablet Take 10 mg by mouth daily. Reported on 08/23/2015    . fluticasone (FLONASE) 50 MCG/ACT nasal spray Place 1 spray into both nostrils daily.    Marland Kitchen gabapentin (NEURONTIN) 300 MG capsule Take 2 capsules (600 mg total) by mouth 3 (three) times daily. 180 capsule 3  . LORazepam (ATIVAN) 0.5 MG tablet Take 1 tablet (0.5 mg total) by mouth at bedtime. 30 tablet 1  . montelukast (SINGULAIR) 10 MG tablet TAKE 1 TABLET (10 MG TOTAL) BY MOUTH AT BEDTIME. 30 tablet 3   No current facility-administered medications for this visit.    PHYSICAL EXAMINATION: ECOG PERFORMANCE STATUS: 1 - Symptomatic but completely ambulatory  Filed Vitals:   10/10/15 1317  BP: 127/62  Pulse: 99  Temp: 98.8 F (37.1 C)  Resp: 18  Filed Weights   10/10/15 1317  Weight: 139 lb 9.6 oz (63.322 kg)    GENERAL:alert, no distress and comfortable SKIN: skin color, texture, turgor are normal, no rashes or significant lesions EYES: normal, Conjunctiva are pink and non-injected, sclera clear OROPHARYNX:no exudate, no erythema and lips, buccal mucosa, and tongue normal  NECK: supple, thyroid  normal size, non-tender, without nodularity LYMPH:  no palpable lymphadenopathy in the cervical, axillary or inguinal LUNGS: clear to auscultation and percussion with normal breathing effort HEART: regular rate & rhythm and no murmurs and no lower extremity edema ABDOMEN:abdomen soft, non-tender and normal bowel sounds MUSCULOSKELETAL:no cyanosis of digits and no clubbing  NEURO: alert & oriented x 3 with fluent speech, no focal motor/sensory deficits EXTREMITIES: Blisters between the toes  LABORATORY DATA:  I have reviewed the data as listed   Chemistry      Component Value Date/Time   NA 144 10/04/2015 0800   K 4.6 10/04/2015 0800   CO2 25 10/04/2015 0800   BUN 8.9 10/04/2015 0800   CREATININE 0.8 10/04/2015 0800      Component Value Date/Time   CALCIUM 9.5 10/04/2015 0800   ALKPHOS 95 10/04/2015 0800   AST 17 10/04/2015 0800   ALT 23 10/04/2015 0800   BILITOT 0.41 10/04/2015 0800       Lab Results  Component Value Date   WBC 5.2 10/10/2015   HGB 9.5* 10/10/2015   HCT 28.9* 10/10/2015   MCV 101.4* 10/10/2015   PLT 299 10/10/2015   NEUTROABS 3.9 10/10/2015     ASSESSMENT & PLAN:  Right mammogram and ultrasound 07/28/2015 Right breast mass in the axillary tail 1.7 x 1.5 x 1.3 cm axillary ultrasound negative, T1c N0 stage IA clinical stage Right breast biopsy 07/11/2015: 10:00 position: Invasive ductal carcinoma grade 3, ER 0%, PR 0%, Ki-67 95%, HER-2 negative ratio 1.21  Treatment plan based on multidisciplinary tumor board: 1. Neoadjuvant chemotherapy with Adriamycin and Cytoxan dose dense 4 followed by Taxol weekly 12 2. Followed by breast conserving surgery with sentinel lymph node study 3. Followed by adjuvant radiation therapy ------------------------------------------------------------------------------------------------------------------------------ Current treatment: Completed 4 cycles of dose dense Adriamycin and Cytoxan, tomorrow would be cycle 2  (treatment change from Taxol to Abraxane) Lab work was reviewed Echocardiogram 08/01/2015: EF 65-70% Breast MRI: 07/30/2015: right breast mass 2.4 x 2.1 x 2.1 cm with a preserved fat plane between the mass in the chest wall, 5 mm enhancing mass in the right breast at 12:00 C,biopsy-proven fibroadenoma.  Chemotherapy toxicities: 1. Neutropenia: I suspect that the Neulasta injection did not work. She gets subcutaneous Neulasta injection instead of Onpro because I believe that her neutropenia with cycle 1 is related to malfunctioning of onpro.  2. Skin rash: Treated with Keflex for 1 week. Resolved 3. Alopecia 4. Blisters underneath the feet: Markedly got worse after the first dose of Taxol. We requested a change in treatment from Taxol to Abraxane it was approved. Her blisters are slowly improving. She will receive the first dose of Abraxane tomorrow.  Denies any nausea or vomiting.  Return to clinic in 2 weeks for cycle 4/12 Abraxane  No orders of the defined types were placed in this encounter.   The patient has a good understanding of the overall plan. she agrees with it. she will call with any problems that may develop before the next visit here.   Rulon Eisenmenger, MD 10/10/2015

## 2015-10-11 ENCOUNTER — Other Ambulatory Visit: Payer: 59

## 2015-10-11 ENCOUNTER — Encounter: Payer: Self-pay | Admitting: *Deleted

## 2015-10-11 ENCOUNTER — Ambulatory Visit (HOSPITAL_BASED_OUTPATIENT_CLINIC_OR_DEPARTMENT_OTHER): Payer: 59

## 2015-10-11 VITALS — BP 136/78 | HR 65 | Temp 98.3°F | Resp 16

## 2015-10-11 DIAGNOSIS — C50411 Malignant neoplasm of upper-outer quadrant of right female breast: Secondary | ICD-10-CM | POA: Diagnosis not present

## 2015-10-11 DIAGNOSIS — Z5111 Encounter for antineoplastic chemotherapy: Secondary | ICD-10-CM

## 2015-10-11 MED ORDER — SODIUM CHLORIDE 0.9% FLUSH
10.0000 mL | INTRAVENOUS | Status: DC | PRN
Start: 2015-10-11 — End: 2015-10-11
  Administered 2015-10-11: 10 mL
  Filled 2015-10-11: qty 10

## 2015-10-11 MED ORDER — SODIUM CHLORIDE 0.9 % IV SOLN
Freq: Once | INTRAVENOUS | Status: AC
  Administered 2015-10-11: 09:00:00 via INTRAVENOUS

## 2015-10-11 MED ORDER — PROCHLORPERAZINE MALEATE 10 MG PO TABS
ORAL_TABLET | ORAL | Status: AC
  Filled 2015-10-11: qty 1

## 2015-10-11 MED ORDER — PROCHLORPERAZINE MALEATE 10 MG PO TABS
10.0000 mg | ORAL_TABLET | Freq: Once | ORAL | Status: AC
  Administered 2015-10-11: 10 mg via ORAL

## 2015-10-11 MED ORDER — PACLITAXEL PROTEIN-BOUND CHEMO INJECTION 100 MG
60.0000 mg/m2 | Freq: Once | INTRAVENOUS | Status: AC
  Administered 2015-10-11: 100 mg via INTRAVENOUS
  Filled 2015-10-11: qty 20

## 2015-10-11 MED ORDER — HEPARIN SOD (PORK) LOCK FLUSH 100 UNIT/ML IV SOLN
500.0000 [IU] | Freq: Once | INTRAVENOUS | Status: AC | PRN
  Administered 2015-10-11: 500 [IU]
  Filled 2015-10-11: qty 5

## 2015-10-11 NOTE — Patient Instructions (Signed)

## 2015-10-18 ENCOUNTER — Ambulatory Visit (HOSPITAL_BASED_OUTPATIENT_CLINIC_OR_DEPARTMENT_OTHER): Payer: 59

## 2015-10-18 ENCOUNTER — Ambulatory Visit (HOSPITAL_BASED_OUTPATIENT_CLINIC_OR_DEPARTMENT_OTHER): Payer: 59 | Admitting: Hematology and Oncology

## 2015-10-18 ENCOUNTER — Other Ambulatory Visit (HOSPITAL_BASED_OUTPATIENT_CLINIC_OR_DEPARTMENT_OTHER): Payer: 59

## 2015-10-18 ENCOUNTER — Telehealth: Payer: Self-pay | Admitting: Hematology and Oncology

## 2015-10-18 ENCOUNTER — Encounter: Payer: Self-pay | Admitting: Hematology and Oncology

## 2015-10-18 ENCOUNTER — Encounter: Payer: Self-pay | Admitting: *Deleted

## 2015-10-18 VITALS — BP 127/78 | HR 90 | Temp 98.7°F | Resp 17 | Wt 139.1 lb

## 2015-10-18 DIAGNOSIS — C50411 Malignant neoplasm of upper-outer quadrant of right female breast: Secondary | ICD-10-CM | POA: Diagnosis not present

## 2015-10-18 DIAGNOSIS — D701 Agranulocytosis secondary to cancer chemotherapy: Secondary | ICD-10-CM | POA: Diagnosis not present

## 2015-10-18 DIAGNOSIS — Z5111 Encounter for antineoplastic chemotherapy: Secondary | ICD-10-CM | POA: Diagnosis not present

## 2015-10-18 LAB — CBC WITH DIFFERENTIAL/PLATELET
BASO%: 0.7 % (ref 0.0–2.0)
BASOS ABS: 0 10*3/uL (ref 0.0–0.1)
EOS%: 1 % (ref 0.0–7.0)
Eosinophils Absolute: 0 10*3/uL (ref 0.0–0.5)
HEMATOCRIT: 31.5 % — AB (ref 34.8–46.6)
HGB: 10.4 g/dL — ABNORMAL LOW (ref 11.6–15.9)
LYMPH#: 0.7 10*3/uL — AB (ref 0.9–3.3)
LYMPH%: 17.9 % (ref 14.0–49.7)
MCH: 33.9 pg (ref 25.1–34.0)
MCHC: 33 g/dL (ref 31.5–36.0)
MCV: 102.9 fL — AB (ref 79.5–101.0)
MONO#: 0.4 10*3/uL (ref 0.1–0.9)
MONO%: 10.6 % (ref 0.0–14.0)
NEUT#: 2.7 10*3/uL (ref 1.5–6.5)
NEUT%: 69.8 % (ref 38.4–76.8)
PLATELETS: 227 10*3/uL (ref 145–400)
RBC: 3.06 10*6/uL — AB (ref 3.70–5.45)
RDW: 18.6 % — ABNORMAL HIGH (ref 11.2–14.5)
WBC: 3.8 10*3/uL — ABNORMAL LOW (ref 3.9–10.3)

## 2015-10-18 LAB — COMPREHENSIVE METABOLIC PANEL
ALT: 90 U/L — AB (ref 0–55)
ANION GAP: 7 meq/L (ref 3–11)
AST: 33 U/L (ref 5–34)
Albumin: 3.8 g/dL (ref 3.5–5.0)
Alkaline Phosphatase: 77 U/L (ref 40–150)
BUN: 6.8 mg/dL — ABNORMAL LOW (ref 7.0–26.0)
CALCIUM: 9.4 mg/dL (ref 8.4–10.4)
CHLORIDE: 110 meq/L — AB (ref 98–109)
CO2: 26 meq/L (ref 22–29)
Creatinine: 0.8 mg/dL (ref 0.6–1.1)
Glucose: 93 mg/dl (ref 70–140)
POTASSIUM: 4.9 meq/L (ref 3.5–5.1)
Sodium: 144 mEq/L (ref 136–145)
Total Bilirubin: 0.4 mg/dL (ref 0.20–1.20)
Total Protein: 6.8 g/dL (ref 6.4–8.3)

## 2015-10-18 MED ORDER — PACLITAXEL PROTEIN-BOUND CHEMO INJECTION 100 MG
60.0000 mg/m2 | Freq: Once | INTRAVENOUS | Status: AC
  Administered 2015-10-18: 100 mg via INTRAVENOUS
  Filled 2015-10-18: qty 20

## 2015-10-18 MED ORDER — PROCHLORPERAZINE MALEATE 10 MG PO TABS
10.0000 mg | ORAL_TABLET | Freq: Once | ORAL | Status: AC
  Administered 2015-10-18: 10 mg via ORAL

## 2015-10-18 MED ORDER — PROCHLORPERAZINE MALEATE 10 MG PO TABS
ORAL_TABLET | ORAL | Status: AC
  Filled 2015-10-18: qty 1

## 2015-10-18 MED ORDER — SODIUM CHLORIDE 0.9 % IV SOLN
Freq: Once | INTRAVENOUS | Status: AC
  Administered 2015-10-18: 10:00:00 via INTRAVENOUS

## 2015-10-18 MED ORDER — HEPARIN SOD (PORK) LOCK FLUSH 100 UNIT/ML IV SOLN
500.0000 [IU] | Freq: Once | INTRAVENOUS | Status: AC | PRN
  Administered 2015-10-18: 500 [IU]
  Filled 2015-10-18: qty 5

## 2015-10-18 MED ORDER — SODIUM CHLORIDE 0.9% FLUSH
10.0000 mL | INTRAVENOUS | Status: DC | PRN
  Administered 2015-10-18: 10 mL
  Filled 2015-10-18: qty 10

## 2015-10-18 NOTE — Patient Instructions (Signed)

## 2015-10-18 NOTE — Progress Notes (Signed)
Patient Care Team: Vania Rea, MD as PCP - General (Obstetrics and Gynecology)  DIAGNOSIS: Breast cancer of upper-outer quadrant of right female breast [facility])   Staging form: Breast, AJCC 7th Edition     Clinical stage from 07/24/2015: Stage IA (T1c, N0, M0) - Unsigned   SUMMARY OF ONCOLOGIC HISTORY:   Breast cancer of upper-outer quadrant of right female breast ([place])   07/11/2015 Mammogram Right breast mass in the axillary tail 1.7 x 1.5 x 1.3 cm axillary ultrasound negative, T1c N0 stage IA clinical stage   07/11/2015 Initial Diagnosis Right breast biopsy 10:00 position: Invasive ductal carcinoma grade 3, ER 0%, P of 0%, Ki-67 95%, HER-2 negative ratio 1.21; right breast biopsy 12:00: Fibrocystic changes   08/02/2015 -  Neo-Adjuvant Chemotherapy Dose dense Adriamycin and Cytoxan 4 followed by Taxol weekly 12    CHIEF COMPLIANT: Abraxane week 3  INTERVAL HISTORY: Courtney Gomez is a 50 year old with above-mentioned history right breast cancer currently on neoadjuvant chemotherapy and really is Abraxane week 3. She could not tolerate Taxol with skin blistering and neuropathy symptoms. She was prescribed Neurontin. She is currently tapering the Neurontin because her symptoms have markedly improved on Abraxane. The blisters have resolved. Skin is healing very well. Does not have any neuropathy symptoms.  REVIEW OF SYSTEMS:   Constitutional: Denies fevers, chills or abnormal weight loss Eyes: Denies blurriness of vision Ears, nose, mouth, throat, and face: Denies mucositis or sore throat Respiratory: Denies cough, dyspnea or wheezes Cardiovascular: Denies palpitation, chest discomfort Gastrointestinal:  Denies nausea, heartburn or change in bowel habits Skin: Denies abnormal skin rashes Lymphatics: Denies new lymphadenopathy or easy bruising Neurological: Mild neuropathy which has improved with Neurontin Behavioral/Psych: Mood is stable, no new changes  Extremities: Skin on the feet is  healing Breast:  denies any pain or lumps or nodules in either breasts All other systems were reviewed with the patient and are negative.  I have reviewed the past medical history, past surgical history, social history and family history with the patient and they are unchanged from previous note.  ALLERGIES:  is allergic to lanolin and latex.  MEDICATIONS:  Current Outpatient Prescriptions  Medication Sig Dispense Refill  . ADVAIR DISKUS 250-50 MCG/DOSE AEPB Inhale 1 puff into the lungs daily.   5  . albuterol (PROAIR HFA) 108 (90 BASE) MCG/ACT inhaler Inhale 2 puffs into the lungs every 6 (six) hours as needed for wheezing or shortness of breath. Reported on 08/23/2015    . cetirizine (ZYRTEC) 10 MG tablet Take 10 mg by mouth daily. Reported on 08/23/2015    . fluticasone (FLONASE) 50 MCG/ACT nasal spray Place 1 spray into both nostrils daily.    Marland Kitchen gabapentin (NEURONTIN) 300 MG capsule Take 2 capsules (600 mg total) by mouth 3 (three) times daily. 180 capsule 3  . LORazepam (ATIVAN) 0.5 MG tablet Take 1 tablet (0.5 mg total) by mouth at bedtime. 30 tablet 1  . montelukast (SINGULAIR) 10 MG tablet TAKE 1 TABLET (10 MG TOTAL) BY MOUTH AT BEDTIME. 30 tablet 3   No current facility-administered medications for this visit.    PHYSICAL EXAMINATION: ECOG PERFORMANCE STATUS: 1 - Symptomatic but completely ambulatory  Filed Vitals:   10/18/15 0904  BP: 127/78  Pulse: 90  Temp: 98.7 F (37.1 C)  Resp: 17   Filed Weights   10/18/15 0904  Weight: 139 lb 1.6 oz (63.095 kg)    GENERAL:alert, no distress and comfortable SKIN: skin color, texture, turgor are normal, no  rashes or significant lesions EYES: normal, Conjunctiva are pink and non-injected, sclera clear OROPHARYNX:no exudate, no erythema and lips, buccal mucosa, and tongue normal  NECK: supple, thyroid normal size, non-tender, without nodularity LYMPH:  no palpable lymphadenopathy in the cervical, axillary or inguinal LUNGS:  clear to auscultation and percussion with normal breathing effort HEART: regular rate & rhythm and no murmurs and no lower extremity edema ABDOMEN:abdomen soft, non-tender and normal bowel sounds MUSCULOSKELETAL:no cyanosis of digits and no clubbing  NEURO: alert & oriented x 3 with fluent speech, Very mild neuropathy EXTREMITIES: No lower extremity edema  LABORATORY DATA:  I have reviewed the data as listed   Chemistry      Component Value Date/Time   NA 144 10/18/2015 0848   K 4.9 10/18/2015 0848   CO2 26 10/18/2015 0848   BUN 6.8* 10/18/2015 0848   CREATININE 0.8 10/18/2015 0848      Component Value Date/Time   CALCIUM 9.4 10/18/2015 0848   ALKPHOS 77 10/18/2015 0848   AST 33 10/18/2015 0848   ALT 90* 10/18/2015 0848   BILITOT 0.40 10/18/2015 0848       Lab Results  Component Value Date   WBC 3.8* 10/18/2015   HGB 10.4* 10/18/2015   HCT 31.5* 10/18/2015   MCV 102.9* 10/18/2015   PLT 227 10/18/2015   NEUTROABS 2.7 10/18/2015     ASSESSMENT & PLAN:  Breast cancer of upper-outer quadrant of right female breast ([place]) Right mammogram and ultrasound 07/28/2015 Right breast mass in the axillary tail 1.7 x 1.5 x 1.3 cm axillary ultrasound negative, T1c N0 stage IA clinical stage Right breast biopsy 07/11/2015: 10:00 position: Invasive ductal carcinoma grade 3, ER 0%, PR 0%, Ki-67 95%, HER-2 negative ratio 1.21  Treatment plan based on multidisciplinary tumor board: 1. Neoadjuvant chemotherapy with Adriamycin and Cytoxan dose dense 4 followed by Taxol weekly 12 2. Followed by breast conserving surgery with sentinel lymph node study 3. Followed by adjuvant radiation therapy ------------------------------------------------------------------------------------------------------------------------------ Current treatment: Completed 4 cycles of dose dense Adriamycin and Cytoxan, today is cycle 3/12 Abraxane (treatment change from Taxol to Abraxane after cycle 1) Lab work was  reviewed Echocardiogram 08/01/2015: EF 65-70% Breast MRI: 07/30/2015: right breast mass 2.4 x 2.1 x 2.1 cm with a preserved fat plane between the mass in the chest wall, 5 mm enhancing mass in the right breast at 12:00 C,biopsy-proven fibroadenoma.  Chemotherapy toxicities: 1. Neutropenia: I suspect that the Neulasta injection did not work. She gets subcutaneous Neulasta injection instead of Onpro because I believe that her neutropenia with cycle 1 is related to malfunctioning of onpro.  2. Skin rash: Treated with Keflex for 1 week. Resolved 3. Alopecia 4. Blisters underneath the feet: Her blisters are slowly improving. 5. Anemia grade 1: Being observed  Denies any nausea or vomiting. Patient would like to get treated in the afternoons at least during the weeks that she does not see me. This will enable her to get back to work. Return to clinic in 2 weeks for cycle 5/12 Abraxane   No orders of the defined types were placed in this encounter.   The patient has a good understanding of the overall plan. she agrees with it. she will call with any problems that may develop before the next visit here.   Rulon Eisenmenger, MD 10/18/2015

## 2015-10-18 NOTE — Telephone Encounter (Signed)
APPOINTMENTS COMPLETED AND MOVED TO PM WHEN PATIENT NOT SEEING [facility] PER 7/21 POF. PATIENT AWARE TO GET NEW AVS REPORT/APPOINTMENTS FROM INFUSION AREA.

## 2015-10-18 NOTE — Assessment & Plan Note (Signed)
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 Neoadjuvant chemotherapy with Adriamycin and Cytoxan dose dense 4 followed by Taxol weekly 12 2. Followed by breast conserving surgery with sentinel lymph node study 3. Followed by adjuvant radiation therapy ------------------------------------------------------------------------------------------------------------------------------ Current treatment: Completed 4 cycles of dose dense Adriamycin and Cytoxan, today is cycle 3/12 Abraxane (treatment change from Taxol to Abraxane after cycle 1) Lab work was reviewed Echocardiogram 08/01/2015: EF 65-70% Breast MRI: 07/30/2015: right breast mass 2.4 x 2.1 x 2.1 cm with a preserved fat plane between the mass in the chest wall, 5 mm enhancing mass in the right breast at 12:00 C,biopsy-proven fibroadenoma.  Chemotherapy toxicities: 1. Neutropenia: I suspect that the Neulasta injection did not work. She gets subcutaneous Neulasta injection instead of Onpro because I believe that her neutropenia with cycle 1 is related to malfunctioning of onpro.  2. Skin rash: Treated with Keflex for 1 week. Resolved 3. Alopecia 4. Blisters underneath the feet: Markedly got worse after the first dose of Taxol. Her blisters are slowly improving.  Denies any nausea or vomiting.  Return to clinic in 2 weeks for cycle 5/12 Abraxane

## 2015-10-22 ENCOUNTER — Other Ambulatory Visit: Payer: Self-pay | Admitting: Hematology and Oncology

## 2015-10-22 DIAGNOSIS — C50411 Malignant neoplasm of upper-outer quadrant of right female breast: Secondary | ICD-10-CM

## 2015-10-22 NOTE — Telephone Encounter (Signed)
Chart reviewed - active treatment

## 2015-10-25 ENCOUNTER — Other Ambulatory Visit (HOSPITAL_BASED_OUTPATIENT_CLINIC_OR_DEPARTMENT_OTHER): Payer: 59

## 2015-10-25 ENCOUNTER — Encounter: Payer: Self-pay | Admitting: *Deleted

## 2015-10-25 ENCOUNTER — Ambulatory Visit (HOSPITAL_BASED_OUTPATIENT_CLINIC_OR_DEPARTMENT_OTHER): Payer: 59

## 2015-10-25 VITALS — BP 138/84 | HR 83 | Temp 99.1°F | Resp 18

## 2015-10-25 DIAGNOSIS — Z5111 Encounter for antineoplastic chemotherapy: Secondary | ICD-10-CM | POA: Diagnosis not present

## 2015-10-25 DIAGNOSIS — C50411 Malignant neoplasm of upper-outer quadrant of right female breast: Secondary | ICD-10-CM | POA: Diagnosis not present

## 2015-10-25 LAB — CBC WITH DIFFERENTIAL/PLATELET
BASO%: 0.3 % (ref 0.0–2.0)
Basophils Absolute: 0 10*3/uL (ref 0.0–0.1)
EOS ABS: 0.2 10*3/uL (ref 0.0–0.5)
EOS%: 4.5 % (ref 0.0–7.0)
HEMATOCRIT: 32.6 % — AB (ref 34.8–46.6)
HGB: 10.8 g/dL — ABNORMAL LOW (ref 11.6–15.9)
LYMPH#: 1.1 10*3/uL (ref 0.9–3.3)
LYMPH%: 30 % (ref 14.0–49.7)
MCH: 34.6 pg — AB (ref 25.1–34.0)
MCHC: 33.1 g/dL (ref 31.5–36.0)
MCV: 104.5 fL — AB (ref 79.5–101.0)
MONO#: 0.3 10*3/uL (ref 0.1–0.9)
MONO%: 8.5 % (ref 0.0–14.0)
NEUT#: 2.1 10*3/uL (ref 1.5–6.5)
NEUT%: 56.7 % (ref 38.4–76.8)
PLATELETS: 232 10*3/uL (ref 145–400)
RBC: 3.12 10*6/uL — ABNORMAL LOW (ref 3.70–5.45)
RDW: 15.7 % — ABNORMAL HIGH (ref 11.2–14.5)
WBC: 3.8 10*3/uL — AB (ref 3.9–10.3)

## 2015-10-25 LAB — COMPREHENSIVE METABOLIC PANEL
ALT: 73 U/L — AB (ref 0–55)
ANION GAP: 10 meq/L (ref 3–11)
AST: 27 U/L (ref 5–34)
Albumin: 4 g/dL (ref 3.5–5.0)
Alkaline Phosphatase: 88 U/L (ref 40–150)
BUN: 7.1 mg/dL (ref 7.0–26.0)
CALCIUM: 9.3 mg/dL (ref 8.4–10.4)
CHLORIDE: 108 meq/L (ref 98–109)
CO2: 26 mEq/L (ref 22–29)
CREATININE: 0.7 mg/dL (ref 0.6–1.1)
EGFR: >90 mL/min/{1.73_m2} (ref >90)
Glucose: 105 mg/dl (ref 70–140)
POTASSIUM: 3.6 meq/L (ref 3.5–5.1)
Sodium: 143 mEq/L (ref 136–145)
Total Bilirubin: 0.46 mg/dL (ref 0.20–1.20)
Total Protein: 7.5 g/dL (ref 6.4–8.3)

## 2015-10-25 MED ORDER — HEPARIN SOD (PORK) LOCK FLUSH 100 UNIT/ML IV SOLN
500.0000 [IU] | Freq: Once | INTRAVENOUS | Status: AC | PRN
  Administered 2015-10-25: 500 [IU]
  Filled 2015-10-25: qty 5

## 2015-10-25 MED ORDER — SODIUM CHLORIDE 0.9% FLUSH
10.0000 mL | INTRAVENOUS | Status: DC | PRN
  Administered 2015-10-25: 10 mL
  Filled 2015-10-25: qty 10

## 2015-10-25 MED ORDER — PROCHLORPERAZINE MALEATE 10 MG PO TABS
ORAL_TABLET | ORAL | Status: AC
  Filled 2015-10-25: qty 1

## 2015-10-25 MED ORDER — SODIUM CHLORIDE 0.9 % IV SOLN
Freq: Once | INTRAVENOUS | Status: AC
  Administered 2015-10-25: 15:00:00 via INTRAVENOUS

## 2015-10-25 MED ORDER — PACLITAXEL PROTEIN-BOUND CHEMO INJECTION 100 MG
60.0000 mg/m2 | Freq: Once | INTRAVENOUS | Status: AC
  Administered 2015-10-25: 100 mg via INTRAVENOUS
  Filled 2015-10-25: qty 20

## 2015-10-25 MED ORDER — PROCHLORPERAZINE MALEATE 10 MG PO TABS
10.0000 mg | ORAL_TABLET | Freq: Once | ORAL | Status: AC
  Administered 2015-10-25: 10 mg via ORAL

## 2015-10-25 NOTE — Patient Instructions (Signed)
[facility] Discharge Instructions for Patients Receiving Chemotherapy  Today you received the following chemotherapy agents gemzar  To help prevent nausea and vomiting after your treatment, we encourage you to take your nausea medication as directed.  If you develop nausea and vomiting that is not controlled by your nausea medication, call the clinic.   BELOW ARE SYMPTOMS THAT SHOULD BE REPORTED IMMEDIATELY:  *FEVER GREATER THAN 100.5 F  *CHILLS WITH OR WITHOUT FEVER  NAUSEA AND VOMITING THAT IS NOT CONTROLLED WITH YOUR NAUSEA MEDICATION  *UNUSUAL SHORTNESS OF BREATH  *UNUSUAL BRUISING OR BLEEDING  TENDERNESS IN MOUTH AND THROAT WITH OR WITHOUT PRESENCE OF ULCERS  *URINARY PROBLEMS  *BOWEL PROBLEMS  UNUSUAL RASH Items with * indicate a potential emergency and should be followed up as soon as possible.  Feel free to call the clinic you have any questions or concerns. The clinic phone number is [phone].  

## 2015-10-29 ENCOUNTER — Ambulatory Visit: Payer: 59 | Admitting: Hematology and Oncology

## 2015-10-29 ENCOUNTER — Other Ambulatory Visit: Payer: 59

## 2015-11-01 ENCOUNTER — Other Ambulatory Visit (HOSPITAL_BASED_OUTPATIENT_CLINIC_OR_DEPARTMENT_OTHER): Payer: 59

## 2015-11-01 ENCOUNTER — Encounter: Payer: Self-pay | Admitting: Hematology and Oncology

## 2015-11-01 ENCOUNTER — Ambulatory Visit (HOSPITAL_BASED_OUTPATIENT_CLINIC_OR_DEPARTMENT_OTHER): Payer: 59

## 2015-11-01 ENCOUNTER — Ambulatory Visit (HOSPITAL_BASED_OUTPATIENT_CLINIC_OR_DEPARTMENT_OTHER): Payer: 59 | Admitting: Hematology and Oncology

## 2015-11-01 DIAGNOSIS — C50411 Malignant neoplasm of upper-outer quadrant of right female breast: Secondary | ICD-10-CM | POA: Diagnosis not present

## 2015-11-01 DIAGNOSIS — Z5111 Encounter for antineoplastic chemotherapy: Secondary | ICD-10-CM

## 2015-11-01 LAB — CBC WITH DIFFERENTIAL/PLATELET
BASO%: 0.6 % (ref 0.0–2.0)
Basophils Absolute: 0 10*3/uL (ref 0.0–0.1)
EOS ABS: 0.1 10*3/uL (ref 0.0–0.5)
EOS%: 2.3 % (ref 0.0–7.0)
HEMATOCRIT: 33.3 % — AB (ref 34.8–46.6)
HGB: 11 g/dL — ABNORMAL LOW (ref 11.6–15.9)
LYMPH#: 0.7 10*3/uL — AB (ref 0.9–3.3)
LYMPH%: 21.9 % (ref 14.0–49.7)
MCH: 33.8 pg (ref 25.1–34.0)
MCHC: 32.9 g/dL (ref 31.5–36.0)
MCV: 102.6 fL — ABNORMAL HIGH (ref 79.5–101.0)
MONO#: 0.2 10*3/uL (ref 0.1–0.9)
MONO%: 7.3 % (ref 0.0–14.0)
NEUT#: 2.2 10*3/uL (ref 1.5–6.5)
NEUT%: 67.9 % (ref 38.4–76.8)
Platelets: 197 10*3/uL (ref 145–400)
RBC: 3.25 10*6/uL — AB (ref 3.70–5.45)
RDW: 15.9 % — ABNORMAL HIGH (ref 11.2–14.5)
WBC: 3.2 10*3/uL — ABNORMAL LOW (ref 3.9–10.3)

## 2015-11-01 LAB — COMPREHENSIVE METABOLIC PANEL
ALT: 49 U/L (ref 0–55)
AST: 24 U/L (ref 5–34)
Albumin: 3.7 g/dL (ref 3.5–5.0)
Alkaline Phosphatase: 87 U/L (ref 40–150)
Anion Gap: 10 mEq/L (ref 3–11)
BUN: 10.3 mg/dL (ref 7.0–26.0)
CALCIUM: 9.4 mg/dL (ref 8.4–10.4)
CHLORIDE: 108 meq/L (ref 98–109)
CO2: 24 meq/L (ref 22–29)
CREATININE: 0.8 mg/dL (ref 0.6–1.1)
EGFR: >90 mL/min/{1.73_m2} (ref >90)
GLUCOSE: 98 mg/dL (ref 70–140)
Potassium: 4.5 mEq/L (ref 3.5–5.1)
Sodium: 142 mEq/L (ref 136–145)
Total Bilirubin: 0.44 mg/dL (ref 0.20–1.20)
Total Protein: 6.6 g/dL (ref 6.4–8.3)

## 2015-11-01 MED ORDER — PACLITAXEL PROTEIN-BOUND CHEMO INJECTION 100 MG
60.0000 mg/m2 | Freq: Once | INTRAVENOUS | Status: AC
  Administered 2015-11-01: 100 mg via INTRAVENOUS
  Filled 2015-11-01: qty 20

## 2015-11-01 MED ORDER — SODIUM CHLORIDE 0.9% FLUSH
10.0000 mL | INTRAVENOUS | Status: DC | PRN
  Administered 2015-11-01: 10 mL
  Filled 2015-11-01: qty 10

## 2015-11-01 MED ORDER — SODIUM CHLORIDE 0.9 % IV SOLN
Freq: Once | INTRAVENOUS | Status: AC
Start: 2015-11-01 — End: 2015-11-01
  Administered 2015-11-01: 09:00:00 via INTRAVENOUS

## 2015-11-01 MED ORDER — PROCHLORPERAZINE MALEATE 10 MG PO TABS
10.0000 mg | ORAL_TABLET | Freq: Once | ORAL | Status: AC
  Administered 2015-11-01: 10 mg via ORAL

## 2015-11-01 MED ORDER — PROCHLORPERAZINE MALEATE 10 MG PO TABS
ORAL_TABLET | ORAL | Status: AC
  Filled 2015-11-01: qty 1

## 2015-11-01 MED ORDER — HEPARIN SOD (PORK) LOCK FLUSH 100 UNIT/ML IV SOLN
500.0000 [IU] | Freq: Once | INTRAVENOUS | Status: AC | PRN
  Administered 2015-11-01: 500 [IU]
  Filled 2015-11-01: qty 5

## 2015-11-01 NOTE — Patient Instructions (Signed)
[facility] Discharge Instructions for Patients Receiving Chemotherapy  Today you received the following chemotherapy agent: Abraxane   To help prevent nausea and vomiting after your treatment, we encourage you to take your nausea medication as prescribed.    If you develop nausea and vomiting that is not controlled by your nausea medication, call the clinic.   BELOW ARE SYMPTOMS THAT SHOULD BE REPORTED IMMEDIATELY:  *FEVER GREATER THAN 100.5 F  *CHILLS WITH OR WITHOUT FEVER  NAUSEA AND VOMITING THAT IS NOT CONTROLLED WITH YOUR NAUSEA MEDICATION  *UNUSUAL SHORTNESS OF BREATH  *UNUSUAL BRUISING OR BLEEDING  TENDERNESS IN MOUTH AND THROAT WITH OR WITHOUT PRESENCE OF ULCERS  *URINARY PROBLEMS  *BOWEL PROBLEMS  UNUSUAL RASH Items with * indicate a potential emergency and should be followed up as soon as possible.  Feel free to call the clinic you have any questions or concerns. The clinic phone number is [phone].  Please show the CHEMO ALERT CARD at check-in to the Emergency Department and triage nurse.   

## 2015-11-01 NOTE — Progress Notes (Signed)
Patient Care Team: Vania Rea, MD as PCP - General (Obstetrics and Gynecology)  DIAGNOSIS: Breast cancer of upper-outer quadrant of right female breast [facility])   Staging form: Breast, AJCC 7th Edition   - Clinical stage from 07/24/2015: Stage IA (T1c, N0, M0) - Unsigned  SUMMARY OF ONCOLOGIC HISTORY:   Breast cancer of upper-outer quadrant of right female breast ([place])   07/11/2015 Mammogram    Right breast mass in the axillary tail 1.7 x 1.5 x 1.3 cm axillary ultrasound negative, T1c N0 stage IA clinical stage     07/11/2015 Initial Diagnosis    Right breast biopsy 10:00 position: Invasive ductal carcinoma grade 3, ER 0%, P of 0%, Ki-67 95%, HER-2 negative ratio 1.21; right breast biopsy 12:00: Fibrocystic changes     08/02/2015 -  Neo-Adjuvant Chemotherapy    Dose dense Adriamycin and Cytoxan 4 followed by Taxol, switched to Abraxane with cycle 2      CHIEF COMPLIANT: Cycle 5 Abraxane  INTERVAL HISTORY: Courtney Gomez is a 50 year old with above-mentioned history of right breast cancer currently on neoadjuvant chemotherapy and today is cycle 5 of Abraxane. She is tolerating Abraxane extremely well. She does not have any nausea or vomiting. She has excellent energy level. She has excellent taste as well. Denies any neuropathy symptoms. Her nails have shown some bridging and slight discoloration but otherwise still intact. She is applying coconut oil to them.  REVIEW OF SYSTEMS:   Constitutional: Denies fevers, chills or abnormal weight loss Eyes: Denies blurriness of vision Ears, nose, mouth, throat, and face: Denies mucositis or sore throat Respiratory: Denies cough, dyspnea or wheezes Cardiovascular: Denies palpitation, chest discomfort Gastrointestinal:  Denies nausea, heartburn or change in bowel habits Skin: Denies abnormal skin rashes Lymphatics: Denies new lymphadenopathy or easy bruising Neurological:Denies numbness, tingling or new weaknesses Behavioral/Psych: Mood is  stable, no new changes  Extremities: No lower extremity edema Breast:  denies any pain or lumps or nodules in either breasts All other systems were reviewed with the patient and are negative.  I have reviewed the past medical history, past surgical history, social history and family history with the patient and they are unchanged from previous note.  ALLERGIES:  is allergic to lanolin and latex.  MEDICATIONS:  Current Outpatient Prescriptions  Medication Sig Dispense Refill  . ADVAIR DISKUS 250-50 MCG/DOSE AEPB Inhale 1 puff into the lungs daily.   5  . albuterol (PROAIR HFA) 108 (90 BASE) MCG/ACT inhaler Inhale 2 puffs into the lungs every 6 (six) hours as needed for wheezing or shortness of breath. Reported on 08/23/2015    . cetirizine (ZYRTEC) 10 MG tablet Take 10 mg by mouth daily. Reported on 08/23/2015    . fluticasone (FLONASE) 50 MCG/ACT nasal spray Place 1 spray into both nostrils daily.    Marland Kitchen gabapentin (NEURONTIN) 300 MG capsule Take 2 capsules (600 mg total) by mouth 3 (three) times daily. 180 capsule 3  . LORazepam (ATIVAN) 0.5 MG tablet TAKE 1 TABLET BY MOUTH AT BEDTIME AS NEEDED 30 tablet 1  . montelukast (SINGULAIR) 10 MG tablet TAKE 1 TABLET (10 MG TOTAL) BY MOUTH AT BEDTIME. 30 tablet 3   No current facility-administered medications for this visit.     PHYSICAL EXAMINATION: ECOG PERFORMANCE STATUS: 1 - Symptomatic but completely ambulatory  Vitals:   11/01/15 0824  BP: 130/75  Pulse: 93  Resp: 18  Temp: 98.6 F (37 C)   Filed Weights   11/01/15 0824  Weight: 140 lb 8  oz (63.7 kg)    GENERAL:alert, no distress and comfortable SKIN: skin color, texture, turgor are normal, no rashes or significant lesions EYES: normal, Conjunctiva are pink and non-injected, sclera clear OROPHARYNX:no exudate, no erythema and lips, buccal mucosa, and tongue normal  NECK: supple, thyroid normal size, non-tender, without nodularity LYMPH:  no palpable lymphadenopathy in the  cervical, axillary or inguinal LUNGS: clear to auscultation and percussion with normal breathing effort HEART: regular rate & rhythm and no murmurs and no lower extremity edema ABDOMEN:abdomen soft, non-tender and normal bowel sounds MUSCULOSKELETAL:no cyanosis of digits and no clubbing  NEURO: alert & oriented x 3 with fluent speech, no focal motor/sensory deficits EXTREMITIES: No lower extremity edema  LABORATORY DATA:  I have reviewed the data as listed   Chemistry      Component Value Date/Time   NA 143 10/25/2015 1410   K 3.6 10/25/2015 1410   CO2 26 10/25/2015 1410   BUN 7.1 10/25/2015 1410   CREATININE 0.7 10/25/2015 1410      Component Value Date/Time   CALCIUM 9.3 10/25/2015 1410   ALKPHOS 88 10/25/2015 1410   AST 27 10/25/2015 1410   ALT 73 (H) 10/25/2015 1410   BILITOT 0.46 10/25/2015 1410       Lab Results  Component Value Date   WBC 3.2 (L) 11/01/2015   HGB 11.0 (L) 11/01/2015   HCT 33.3 (L) 11/01/2015   MCV 102.6 (H) 11/01/2015   PLT 197 11/01/2015   NEUTROABS 2.2 11/01/2015     ASSESSMENT & PLAN:  Breast cancer of upper-outer quadrant of right female breast ([place]) Right mammogram and ultrasound 07/28/2015 Right breast mass in the axillary tail 1.7 x 1.5 x 1.3 cm axillary ultrasound negative, T1c N0 stage IA clinical stage Right breast biopsy 07/11/2015: 10:00 position: Invasive ductal carcinoma grade 3, ER 0%, PR 0%, Ki-67 95%, HER-2 negative ratio 1.21  Treatment plan based on multidisciplinary tumor board: 1. Neoadjuvant chemotherapy with Adriamycin and Cytoxan dose dense 4 followed by Taxol weekly 12 2. Followed by breast conserving surgery with sentinel lymph node study 3. Followed by adjuvant radiation therapy ------------------------------------------------------------------------------------------------------------------------------ Current treatment: Completed 4 cycles of dose dense Adriamycin and Cytoxan, today is cycle 5/12 Abraxane  (treatment change from Taxol to Abraxane after cycle 1) Lab work was reviewed Echocardiogram 08/01/2015: EF 65-70% Breast MRI: 07/30/2015: right breast mass 2.4 x 2.1 x 2.1 cm with a preserved fat plane between the mass in the chest wall, 5 mm enhancing mass in the right breast at 12:00 C,biopsy-proven fibroadenoma.  Chemotherapy toxicities: 1. Neutropenia: I suspect that the Neulasta injection did not work. She gets subcutaneous Neulasta injection instead of Onpro because I believe that her neutropenia with cycle 1 is related to malfunctioning of onpro.  2. Skin rash: Treated with Keflex for 1 week. Resolved 3. Alopecia 4. Blisters underneath the feet: Her blisters are slowly improving. 5. Anemia grade 1: Being observed  Denies any nausea or vomiting. Return to clinic in 2 weeks for cycle 7/12 Abraxane   No orders of the defined types were placed in this encounter.  The patient has a good understanding of the overall plan. she agrees with it. she will call with any problems that may develop before the next visit here.   Rulon Eisenmenger, MD 11/01/15

## 2015-11-01 NOTE — Assessment & Plan Note (Signed)
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 Neoadjuvant chemotherapy with Adriamycin and Cytoxan dose dense 4 followed by Taxol weekly 12 2. Followed by breast conserving surgery with sentinel lymph node study 3. Followed by adjuvant radiation therapy ------------------------------------------------------------------------------------------------------------------------------ Current treatment: Completed 4 cycles of dose dense Adriamycin and Cytoxan, today is cycle 5/12 Abraxane (treatment change from Taxol to Abraxane after cycle 1) Lab work was reviewed Echocardiogram 08/01/2015: EF 65-70% Breast MRI: 07/30/2015: right breast mass 2.4 x 2.1 x 2.1 cm with a preserved fat plane between the mass in the chest wall, 5 mm enhancing mass in the right breast at 12:00 C,biopsy-proven fibroadenoma.  Chemotherapy toxicities: 1. Neutropenia: I suspect that the Neulasta injection did not work. She gets subcutaneous Neulasta injection instead of Onpro because I believe that her neutropenia with cycle 1 is related to malfunctioning of onpro.  2. Skin rash: Treated with Keflex for 1 week. Resolved 3. Alopecia 4. Blisters underneath the feet: Her blisters are slowly improving. 5. Anemia grade 1: Being observed  Denies any nausea or vomiting. Return to clinic in 2 weeks for cycle 7/12 Abraxane

## 2015-11-08 ENCOUNTER — Ambulatory Visit (HOSPITAL_BASED_OUTPATIENT_CLINIC_OR_DEPARTMENT_OTHER): Payer: 59

## 2015-11-08 ENCOUNTER — Other Ambulatory Visit: Payer: Self-pay

## 2015-11-08 ENCOUNTER — Other Ambulatory Visit (HOSPITAL_BASED_OUTPATIENT_CLINIC_OR_DEPARTMENT_OTHER): Payer: 59

## 2015-11-08 VITALS — BP 131/71 | HR 86 | Resp 18

## 2015-11-08 DIAGNOSIS — C50411 Malignant neoplasm of upper-outer quadrant of right female breast: Secondary | ICD-10-CM | POA: Diagnosis not present

## 2015-11-08 DIAGNOSIS — Z5111 Encounter for antineoplastic chemotherapy: Secondary | ICD-10-CM

## 2015-11-08 LAB — CBC WITH DIFFERENTIAL/PLATELET
BASO%: 0.6 % (ref 0.0–2.0)
BASOS ABS: 0 10*3/uL (ref 0.0–0.1)
EOS ABS: 0.1 10*3/uL (ref 0.0–0.5)
EOS%: 3.5 % (ref 0.0–7.0)
HCT: 33.7 % — ABNORMAL LOW (ref 34.8–46.6)
HEMOGLOBIN: 11 g/dL — AB (ref 11.6–15.9)
LYMPH%: 26.1 % (ref 14.0–49.7)
MCH: 33.4 pg (ref 25.1–34.0)
MCHC: 32.8 g/dL (ref 31.5–36.0)
MCV: 101.8 fL — AB (ref 79.5–101.0)
MONO#: 0.3 10*3/uL (ref 0.1–0.9)
MONO%: 9.5 % (ref 0.0–14.0)
NEUT#: 2.1 10*3/uL (ref 1.5–6.5)
NEUT%: 60.3 % (ref 38.4–76.8)
Platelets: 224 10*3/uL (ref 145–400)
RBC: 3.31 10*6/uL — ABNORMAL LOW (ref 3.70–5.45)
RDW: 15.1 % — AB (ref 11.2–14.5)
WBC: 3.5 10*3/uL — ABNORMAL LOW (ref 3.9–10.3)
lymph#: 0.9 10*3/uL (ref 0.9–3.3)

## 2015-11-08 LAB — COMPREHENSIVE METABOLIC PANEL
ALBUMIN: 3.8 g/dL (ref 3.5–5.0)
ALK PHOS: 89 U/L (ref 40–150)
ALT: 46 U/L (ref 0–55)
AST: 23 U/L (ref 5–34)
Anion Gap: 8 mEq/L (ref 3–11)
BUN: 9.8 mg/dL (ref 7.0–26.0)
CALCIUM: 9.4 mg/dL (ref 8.4–10.4)
CHLORIDE: 106 meq/L (ref 98–109)
CO2: 29 mEq/L (ref 22–29)
Creatinine: 0.8 mg/dL (ref 0.6–1.1)
GLUCOSE: 97 mg/dL (ref 70–140)
POTASSIUM: 4 meq/L (ref 3.5–5.1)
SODIUM: 143 meq/L (ref 136–145)
Total Bilirubin: 0.37 mg/dL (ref 0.20–1.20)
Total Protein: 6.9 g/dL (ref 6.4–8.3)

## 2015-11-08 MED ORDER — PROCHLORPERAZINE MALEATE 10 MG PO TABS
10.0000 mg | ORAL_TABLET | Freq: Once | ORAL | Status: AC
  Administered 2015-11-08: 10 mg via ORAL

## 2015-11-08 MED ORDER — SODIUM CHLORIDE 0.9 % IV SOLN
Freq: Once | INTRAVENOUS | Status: AC
  Administered 2015-11-08: 15:00:00 via INTRAVENOUS

## 2015-11-08 MED ORDER — HEPARIN SOD (PORK) LOCK FLUSH 100 UNIT/ML IV SOLN
500.0000 [IU] | Freq: Once | INTRAVENOUS | Status: AC | PRN
  Administered 2015-11-08: 500 [IU]
  Filled 2015-11-08: qty 5

## 2015-11-08 MED ORDER — FLUTICASONE PROPIONATE 50 MCG/ACT NA SUSP: 1.0000 | Freq: Every day | NASAL | 3 refills | Status: DC

## 2015-11-08 MED ORDER — PROCHLORPERAZINE MALEATE 10 MG PO TABS
ORAL_TABLET | ORAL | Status: AC
  Filled 2015-11-08: qty 1

## 2015-11-08 MED ORDER — PACLITAXEL PROTEIN-BOUND CHEMO INJECTION 100 MG
60.0000 mg/m2 | Freq: Once | INTRAVENOUS | Status: AC
  Administered 2015-11-08: 100 mg via INTRAVENOUS
  Filled 2015-11-08: qty 20

## 2015-11-08 MED ORDER — SODIUM CHLORIDE 0.9% FLUSH
10.0000 mL | INTRAVENOUS | Status: DC | PRN
  Administered 2015-11-08: 10 mL
  Filled 2015-11-08: qty 10

## 2015-11-08 NOTE — Telephone Encounter (Signed)
RX SENT TO PHARMACY

## 2015-11-08 NOTE — Telephone Encounter (Signed)
SPOKE TO PT ADVISED THIS HAS BEEN SENT TO PHARMACY TO LAST HER UNTIL APPT

## 2015-11-08 NOTE — Patient Instructions (Signed)
[facility] Discharge Instructions for Patients Receiving Chemotherapy  Today you received the following chemotherapy agents: Abraxane   To help prevent nausea and vomiting after your treatment, we encourage you to take your nausea medication as directed.    If you develop nausea and vomiting that is not controlled by your nausea medication, call the clinic.   BELOW ARE SYMPTOMS THAT SHOULD BE REPORTED IMMEDIATELY:  *FEVER GREATER THAN 100.5 F  *CHILLS WITH OR WITHOUT FEVER  NAUSEA AND VOMITING THAT IS NOT CONTROLLED WITH YOUR NAUSEA MEDICATION  *UNUSUAL SHORTNESS OF BREATH  *UNUSUAL BRUISING OR BLEEDING  TENDERNESS IN MOUTH AND THROAT WITH OR WITHOUT PRESENCE OF ULCERS  *URINARY PROBLEMS  *BOWEL PROBLEMS  UNUSUAL RASH Items with * indicate a potential emergency and should be followed up as soon as possible.  Feel free to call the clinic you have any questions or concerns. The clinic phone number is [phone].  Please show the CHEMO ALERT CARD at check-in to the Emergency Department and triage nurse.   

## 2015-11-08 NOTE — Telephone Encounter (Signed)
Pt needs more refills for Flonase sent in to the [facility] [address]. Her next visit to see Dr. Verlin Fester is not until December.   Please Advise

## 2015-11-15 ENCOUNTER — Encounter: Payer: Self-pay | Admitting: Hematology and Oncology

## 2015-11-15 ENCOUNTER — Other Ambulatory Visit (HOSPITAL_BASED_OUTPATIENT_CLINIC_OR_DEPARTMENT_OTHER): Payer: 59

## 2015-11-15 ENCOUNTER — Ambulatory Visit (HOSPITAL_BASED_OUTPATIENT_CLINIC_OR_DEPARTMENT_OTHER): Payer: 59 | Admitting: Hematology and Oncology

## 2015-11-15 ENCOUNTER — Ambulatory Visit (HOSPITAL_BASED_OUTPATIENT_CLINIC_OR_DEPARTMENT_OTHER): Payer: 59

## 2015-11-15 DIAGNOSIS — Z5111 Encounter for antineoplastic chemotherapy: Secondary | ICD-10-CM

## 2015-11-15 DIAGNOSIS — Z5189 Encounter for other specified aftercare: Secondary | ICD-10-CM | POA: Diagnosis not present

## 2015-11-15 DIAGNOSIS — C50411 Malignant neoplasm of upper-outer quadrant of right female breast: Secondary | ICD-10-CM

## 2015-11-15 DIAGNOSIS — D701 Agranulocytosis secondary to cancer chemotherapy: Secondary | ICD-10-CM

## 2015-11-15 LAB — CBC WITH DIFFERENTIAL/PLATELET
BASO%: 0.7 % (ref 0.0–2.0)
BASOS ABS: 0 10*3/uL (ref 0.0–0.1)
EOS%: 2 % (ref 0.0–7.0)
Eosinophils Absolute: 0.1 10*3/uL (ref 0.0–0.5)
HEMATOCRIT: 35.1 % (ref 34.8–46.6)
HEMOGLOBIN: 11.6 g/dL (ref 11.6–15.9)
LYMPH#: 0.7 10*3/uL — AB (ref 0.9–3.3)
LYMPH%: 20.6 % (ref 14.0–49.7)
MCH: 33.5 pg (ref 25.1–34.0)
MCHC: 33.1 g/dL (ref 31.5–36.0)
MCV: 101.3 fL — ABNORMAL HIGH (ref 79.5–101.0)
MONO#: 0.2 10*3/uL (ref 0.1–0.9)
MONO%: 6.1 % (ref 0.0–14.0)
NEUT%: 70.6 % (ref 38.4–76.8)
NEUTROS ABS: 2.3 10*3/uL (ref 1.5–6.5)
Platelets: 241 10*3/uL (ref 145–400)
RBC: 3.46 10*6/uL — ABNORMAL LOW (ref 3.70–5.45)
RDW: 14.7 % — AB (ref 11.2–14.5)
WBC: 3.3 10*3/uL — AB (ref 3.9–10.3)

## 2015-11-15 LAB — COMPREHENSIVE METABOLIC PANEL
ALBUMIN: 3.7 g/dL (ref 3.5–5.0)
ALK PHOS: 80 U/L (ref 40–150)
ALT: 39 U/L (ref 0–55)
AST: 26 U/L (ref 5–34)
Anion Gap: 8 mEq/L (ref 3–11)
BILIRUBIN TOTAL: 0.43 mg/dL (ref 0.20–1.20)
BUN: 11.1 mg/dL (ref 7.0–26.0)
CALCIUM: 9.5 mg/dL (ref 8.4–10.4)
CO2: 27 mEq/L (ref 22–29)
CREATININE: 0.8 mg/dL (ref 0.6–1.1)
Chloride: 109 mEq/L (ref 98–109)
EGFR: >90 mL/min/{1.73_m2} (ref >90)
GLUCOSE: 74 mg/dL (ref 70–140)
POTASSIUM: 4.2 meq/L (ref 3.5–5.1)
SODIUM: 143 meq/L (ref 136–145)
TOTAL PROTEIN: 6.7 g/dL (ref 6.4–8.3)

## 2015-11-15 MED ORDER — SODIUM CHLORIDE 0.9% FLUSH
10.0000 mL | INTRAVENOUS | Status: DC | PRN
  Administered 2015-11-15: 10 mL
  Filled 2015-11-15: qty 10

## 2015-11-15 MED ORDER — HEPARIN SOD (PORK) LOCK FLUSH 100 UNIT/ML IV SOLN
500.0000 [IU] | Freq: Once | INTRAVENOUS | Status: AC | PRN
  Administered 2015-11-15: 500 [IU]
  Filled 2015-11-15: qty 5

## 2015-11-15 MED ORDER — PROCHLORPERAZINE MALEATE 10 MG PO TABS
10.0000 mg | ORAL_TABLET | Freq: Once | ORAL | Status: AC
  Administered 2015-11-15: 10 mg via ORAL

## 2015-11-15 MED ORDER — SODIUM CHLORIDE 0.9 % IV SOLN
Freq: Once | INTRAVENOUS | Status: AC
  Administered 2015-11-15: 11:00:00 via INTRAVENOUS

## 2015-11-15 MED ORDER — ALTEPLASE 2 MG IJ SOLR
2.0000 mg | Freq: Once | INTRAMUSCULAR | Status: AC | PRN
  Administered 2015-11-15: 2 mg
  Filled 2015-11-15: qty 2

## 2015-11-15 MED ORDER — PROCHLORPERAZINE MALEATE 10 MG PO TABS
ORAL_TABLET | ORAL | Status: AC
  Filled 2015-11-15: qty 1

## 2015-11-15 MED ORDER — PACLITAXEL PROTEIN-BOUND CHEMO INJECTION 100 MG
60.0000 mg/m2 | Freq: Once | INTRAVENOUS | Status: AC
  Administered 2015-11-15: 100 mg via INTRAVENOUS
  Filled 2015-11-15: qty 20

## 2015-11-15 NOTE — Assessment & Plan Note (Signed)
Right mammogram and ultrasound 07/28/2015 Right breast mass in the axillary tail 1.7 x 1.5 x 1.3 cm axillary ultrasound negative, T1c N0 stage IA clinical stage Right breast biopsy 07/11/2015: 10:00 position: Invasive ductal carcinoma grade 3, ER 0%, PR 0%, Ki-67 95%, HER-2 negative ratio 1.21  Treatment planbased on multidisciplinary tumor board: 1. Neoadjuvant chemotherapy with Adriamycin and Cytoxan dose dense 4 followed by Taxol weekly 12 2. Followed by breast conserving surgery with sentinel lymph node study 3. Followed by adjuvant radiation therapy ------------------------------------------------------------------------------------------------------------------------------ Current treatment: Completed 4 cycles of dose dense Adriamycin and Cytoxan, today is cycle 7/12 Abraxane (treatment change from Taxol to Abraxane after cycle 1) Lab work was reviewed Echocardiogram 08/01/2015: EF 65-70% Breast MRI: 07/30/2015: right breast mass 2.4 x 2.1 x 2.1 cm with a preserved fat plane between the mass in the chest wall, 5 mm enhancing mass in the right breast at 12:00 C,biopsy-proven fibroadenoma.  Chemotherapy toxicities: 1. Neutropenia: During Adriamycin and Cytoxan, related to malfunctioning of onpro.  2. Skin rash: Treated with Keflex for 1 week. Resolved 3. Alopecia 4. Blisters underneath the feet: Her blisters are slowly improving. 5. Anemia grade 1: Being observed Patient denies neuropathy symptoms.  Denies any nausea or vomiting. Return to clinic in 2 weeks for cycle 9/12 Abraxane

## 2015-11-15 NOTE — Patient Instructions (Signed)
[facility] Discharge Instructions for Patients Receiving Chemotherapy  Today you received the following chemotherapy agents: Abraxane   To help prevent nausea and vomiting after your treatment, we encourage you to take your nausea medication as directed.    If you develop nausea and vomiting that is not controlled by your nausea medication, call the clinic.   BELOW ARE SYMPTOMS THAT SHOULD BE REPORTED IMMEDIATELY:  *FEVER GREATER THAN 100.5 F  *CHILLS WITH OR WITHOUT FEVER  NAUSEA AND VOMITING THAT IS NOT CONTROLLED WITH YOUR NAUSEA MEDICATION  *UNUSUAL SHORTNESS OF BREATH  *UNUSUAL BRUISING OR BLEEDING  TENDERNESS IN MOUTH AND THROAT WITH OR WITHOUT PRESENCE OF ULCERS  *URINARY PROBLEMS  *BOWEL PROBLEMS  UNUSUAL RASH Items with * indicate a potential emergency and should be followed up as soon as possible.  Feel free to call the clinic you have any questions or concerns. The clinic phone number is [phone].  Please show the CHEMO ALERT CARD at check-in to the Emergency Department and triage nurse.   

## 2015-11-15 NOTE — Progress Notes (Signed)
Patient Care Team: Courtney Rea, MD as PCP - General (Obstetrics and Gynecology)  DIAGNOSIS: Breast cancer of upper-outer quadrant of right female breast [facility])   Staging form: Breast, AJCC 7th Edition   - Clinical stage from 07/24/2015: Stage IA (T1c, N0, M0) - Unsigned  SUMMARY OF ONCOLOGIC HISTORY:   Breast cancer of upper-outer quadrant of right female breast ([place])   07/11/2015 Mammogram    Right breast mass in the axillary tail 1.7 x 1.5 x 1.3 cm axillary ultrasound negative, T1c N0 stage IA clinical stage      07/11/2015 Initial Diagnosis    Right breast biopsy 10:00 position: Invasive ductal carcinoma grade 3, ER 0%, P of 0%, Ki-67 95%, HER-2 negative ratio 1.21; right breast biopsy 12:00: Fibrocystic changes      08/02/2015 -  Neo-Adjuvant Chemotherapy    Dose dense Adriamycin and Cytoxan 4 followed by Taxol, switched to Abraxane with cycle 2       CHIEF COMPLIANT: Cycle 7 Abraxane  INTERVAL HISTORY: Courtney Gomez is a 50 year old with above-mentioned history of right breast cancer currently on neoadjuvant chemotherapy and today is cycle 7 of Abraxane. She is tolerating Abraxane fairly well. She denies any nausea vomiting. Taste is excellent. Denies any neuropathy. The blisters on her feet have resolved.   REVIEW OF SYSTEMS:   Constitutional: Denies fevers, chills or abnormal weight loss Eyes: Denies blurriness of vision Ears, nose, mouth, throat, and face: Denies mucositis or sore throat Respiratory: Denies cough, dyspnea or wheezes Cardiovascular: Denies palpitation, chest discomfort Gastrointestinal:  Denies nausea, heartburn or change in bowel habits Skin: Denies abnormal skin rashes Lymphatics: Denies new lymphadenopathy or easy bruising Neurological:Denies numbness, tingling or new weaknesses Behavioral/Psych: Mood is stable, no new changes  Extremities: No lower extremity edema  All other systems were reviewed with the patient and are negative.  I have  reviewed the past medical history, past surgical history, social history and family history with the patient and they are unchanged from previous note.  ALLERGIES:  is allergic to lanolin and latex.  MEDICATIONS:  Current Outpatient Prescriptions  Medication Sig Dispense Refill  . ADVAIR DISKUS 250-50 MCG/DOSE AEPB Inhale 1 puff into the lungs daily.   5  . albuterol (PROAIR HFA) 108 (90 BASE) MCG/ACT inhaler Inhale 2 puffs into the lungs every 6 (six) hours as needed for wheezing or shortness of breath. Reported on 08/23/2015    . cetirizine (ZYRTEC) 10 MG tablet Take 10 mg by mouth daily. Reported on 08/23/2015    . fluticasone (FLONASE) 50 MCG/ACT nasal spray Place 1 spray into both nostrils daily. 16 g 3  . gabapentin (NEURONTIN) 300 MG capsule Take 2 capsules (600 mg total) by mouth 3 (three) times daily. 180 capsule 3  . LORazepam (ATIVAN) 0.5 MG tablet TAKE 1 TABLET BY MOUTH AT BEDTIME AS NEEDED 30 tablet 1  . montelukast (SINGULAIR) 10 MG tablet TAKE 1 TABLET (10 MG TOTAL) BY MOUTH AT BEDTIME. 30 tablet 3   No current facility-administered medications for this visit.     PHYSICAL EXAMINATION: ECOG PERFORMANCE STATUS: 0 - Asymptomatic  Vitals:   11/15/15 0912  BP: 130/72  Pulse: 94  Resp: 18  Temp: 98.4 F (36.9 C)   Filed Weights   11/15/15 0912  Weight: 139 lb 3.2 oz (63.1 kg)    GENERAL:alert, no distress and comfortable SKIN: skin color, texture, turgor are normal, no rashes or significant lesions EYES: normal, Conjunctiva are pink and non-injected, sclera clear OROPHARYNX:no exudate,  no erythema and lips, buccal mucosa, and tongue normal  NECK: supple, thyroid normal size, non-tender, without nodularity LYMPH:  no palpable lymphadenopathy in the cervical, axillary or inguinal LUNGS: clear to auscultation and percussion with normal breathing effort HEART: regular rate & rhythm and no murmurs and no lower extremity edema ABDOMEN:abdomen soft, non-tender and normal  bowel sounds MUSCULOSKELETAL:no cyanosis of digits and no clubbing  NEURO: alert & oriented x 3 with fluent speech, no focal motor/sensory deficits EXTREMITIES: No lower extremity edema  LABORATORY DATA:  I have reviewed the data as listed   Chemistry      Component Value Date/Time   NA 143 11/08/2015 1413   K 4.0 11/08/2015 1413   CO2 29 11/08/2015 1413   BUN 9.8 11/08/2015 1413   CREATININE 0.8 11/08/2015 1413      Component Value Date/Time   CALCIUM 9.4 11/08/2015 1413   ALKPHOS 89 11/08/2015 1413   AST 23 11/08/2015 1413   ALT 46 11/08/2015 1413   BILITOT 0.37 11/08/2015 1413       Lab Results  Component Value Date   WBC 3.3 (L) 11/15/2015   HGB 11.6 11/15/2015   HCT 35.1 11/15/2015   MCV 101.3 (H) 11/15/2015   PLT 241 11/15/2015   NEUTROABS 2.3 11/15/2015     ASSESSMENT & PLAN:  Breast cancer of upper-outer quadrant of right female breast ([facility]) Right mammogram and ultrasound 07/28/2015 Right breast mass in the axillary tail 1.7 x 1.5 x 1.3 cm axillary ultrasound negative, T1c N0 stage IA clinical stage Right breast biopsy 07/11/2015: 10:00 position: Invasive ductal carcinoma grade 3, ER 0%, PR 0%, Ki-67 95%, HER-2 negative ratio 1.21  Treatment planbased on multidisciplinary tumor board: 1. Neoadjuvant chemotherapy with Adriamycin and Cytoxan dose dense 4 followed by Taxol weekly 12 2. Followed by breast conserving surgery with sentinel lymph node study 3. Followed by adjuvant radiation therapy ------------------------------------------------------------------------------------------------------------------------------ Current treatment: Completed 4 cycles of dose dense Adriamycin and Cytoxan, today is cycle 7/12 Abraxane (treatment change from Taxol to Abraxane after cycle 1) Lab work was reviewed Echocardiogram 08/01/2015: EF 65-70% Breast MRI: 07/30/2015: right breast mass 2.4 x 2.1 x 2.1 cm with a preserved fat plane between the mass in the chest wall, 5  mm enhancing mass in the right breast at 12:00 C,biopsy-proven fibroadenoma.  Chemotherapy toxicities: 1. Neutropenia: During Adriamycin and Cytoxan, related to malfunctioning of onpro.  2. Skin rash: Treated with Keflex for 1 week. Resolved 3. Alopecia 4. Blisters underneath the feet: Her blisters are slowly improving. 5. Anemia grade 1: Resolved Patient denies neuropathy symptoms.  Denies any nausea or vomiting. Return to clinic in 2 weeks for cycle 9/12 Abraxane   No orders of the defined types were placed in this encounter.  The patient has a good understanding of the overall plan. she agrees with it. she will call with any problems that may develop before the next visit here.   Rulon Eisenmenger, MD 11/15/15

## 2015-11-22 ENCOUNTER — Other Ambulatory Visit: Payer: Self-pay

## 2015-11-22 ENCOUNTER — Other Ambulatory Visit (HOSPITAL_BASED_OUTPATIENT_CLINIC_OR_DEPARTMENT_OTHER): Payer: 59

## 2015-11-22 ENCOUNTER — Encounter: Payer: Self-pay | Admitting: *Deleted

## 2015-11-22 ENCOUNTER — Ambulatory Visit (HOSPITAL_BASED_OUTPATIENT_CLINIC_OR_DEPARTMENT_OTHER): Payer: 59

## 2015-11-22 VITALS — BP 143/92 | HR 92 | Temp 98.6°F | Resp 17

## 2015-11-22 DIAGNOSIS — Z5111 Encounter for antineoplastic chemotherapy: Secondary | ICD-10-CM

## 2015-11-22 DIAGNOSIS — C50411 Malignant neoplasm of upper-outer quadrant of right female breast: Secondary | ICD-10-CM | POA: Diagnosis not present

## 2015-11-22 LAB — COMPREHENSIVE METABOLIC PANEL
ALT: 40 U/L (ref 0–55)
AST: 26 U/L (ref 5–34)
Albumin: 3.8 g/dL (ref 3.5–5.0)
Alkaline Phosphatase: 87 U/L (ref 40–150)
Anion Gap: 9 mEq/L (ref 3–11)
BILIRUBIN TOTAL: 0.44 mg/dL (ref 0.20–1.20)
BUN: 10.6 mg/dL (ref 7.0–26.0)
CO2: 27 meq/L (ref 22–29)
CREATININE: 0.8 mg/dL (ref 0.6–1.1)
Calcium: 9.4 mg/dL (ref 8.4–10.4)
Chloride: 107 mEq/L (ref 98–109)
EGFR: 83 mL/min/{1.73_m2} — ABNORMAL LOW (ref >90)
GLUCOSE: 111 mg/dL (ref 70–140)
Potassium: 3.8 mEq/L (ref 3.5–5.1)
SODIUM: 143 meq/L (ref 136–145)
TOTAL PROTEIN: 7 g/dL (ref 6.4–8.3)

## 2015-11-22 LAB — CBC WITH DIFFERENTIAL/PLATELET
BASO%: 0.5 % (ref 0.0–2.0)
Basophils Absolute: 0 10*3/uL (ref 0.0–0.1)
EOS%: 1.6 % (ref 0.0–7.0)
Eosinophils Absolute: 0.1 10*3/uL (ref 0.0–0.5)
HCT: 34.9 % (ref 34.8–46.6)
HEMOGLOBIN: 11.6 g/dL (ref 11.6–15.9)
LYMPH%: 25.1 % (ref 14.0–49.7)
MCH: 33.8 pg (ref 25.1–34.0)
MCHC: 33.4 g/dL (ref 31.5–36.0)
MCV: 101.3 fL — ABNORMAL HIGH (ref 79.5–101.0)
MONO#: 0.3 10*3/uL (ref 0.1–0.9)
MONO%: 7.5 % (ref 0.0–14.0)
NEUT%: 65.3 % (ref 38.4–76.8)
NEUTROS ABS: 2.3 10*3/uL (ref 1.5–6.5)
Platelets: 242 10*3/uL (ref 145–400)
RBC: 3.45 10*6/uL — AB (ref 3.70–5.45)
RDW: 15 % — AB (ref 11.2–14.5)
WBC: 3.5 10*3/uL — AB (ref 3.9–10.3)
lymph#: 0.9 10*3/uL (ref 0.9–3.3)

## 2015-11-22 MED ORDER — PACLITAXEL PROTEIN-BOUND CHEMO INJECTION 100 MG
60.0000 mg/m2 | Freq: Once | INTRAVENOUS | Status: AC
  Administered 2015-11-22: 100 mg via INTRAVENOUS
  Filled 2015-11-22: qty 20

## 2015-11-22 MED ORDER — LORAZEPAM 0.5 MG PO TABS: 0.5000 mg | ORAL_TABLET | Freq: Every evening | ORAL | 0 refills | Status: DC | PRN

## 2015-11-22 MED ORDER — PROCHLORPERAZINE MALEATE 10 MG PO TABS
10.0000 mg | ORAL_TABLET | Freq: Once | ORAL | Status: AC
  Administered 2015-11-22: 10 mg via ORAL

## 2015-11-22 MED ORDER — PROCHLORPERAZINE MALEATE 10 MG PO TABS
ORAL_TABLET | ORAL | Status: AC
  Filled 2015-11-22: qty 1

## 2015-11-22 MED ORDER — SODIUM CHLORIDE 0.9% FLUSH
10.0000 mL | INTRAVENOUS | Status: DC | PRN
  Administered 2015-11-22: 10 mL
  Filled 2015-11-22: qty 10

## 2015-11-22 MED ORDER — SODIUM CHLORIDE 0.9 % IV SOLN
Freq: Once | INTRAVENOUS | Status: AC
  Administered 2015-11-22: 15:00:00 via INTRAVENOUS

## 2015-11-22 MED ORDER — HEPARIN SOD (PORK) LOCK FLUSH 100 UNIT/ML IV SOLN
500.0000 [IU] | Freq: Once | INTRAVENOUS | Status: AC | PRN
  Administered 2015-11-22: 500 [IU]
  Filled 2015-11-22: qty 5

## 2015-11-22 NOTE — Patient Instructions (Signed)
[facility] Discharge Instructions for Patients Receiving Chemotherapy  Today you received the following chemotherapy agents Abraxane To help prevent nausea and vomiting after your treatment, we encourage you to take your nausea medication as prescribed.   If you develop nausea and vomiting that is not controlled by your nausea medication, call the clinic.   BELOW ARE SYMPTOMS THAT SHOULD BE REPORTED IMMEDIATELY:  *FEVER GREATER THAN 100.5 F  *CHILLS WITH OR WITHOUT FEVER  NAUSEA AND VOMITING THAT IS NOT CONTROLLED WITH YOUR NAUSEA MEDICATION  *UNUSUAL SHORTNESS OF BREATH  *UNUSUAL BRUISING OR BLEEDING  TENDERNESS IN MOUTH AND THROAT WITH OR WITHOUT PRESENCE OF ULCERS  *URINARY PROBLEMS  *BOWEL PROBLEMS  UNUSUAL RASH Items with * indicate a potential emergency and should be followed up as soon as possible.  Feel free to call the clinic you have any questions or concerns. The clinic phone number is [phone].  Please show the CHEMO ALERT CARD at check-in to the Emergency Department and triage nurse.   

## 2015-11-29 ENCOUNTER — Encounter: Payer: Self-pay | Admitting: Hematology and Oncology

## 2015-11-29 ENCOUNTER — Ambulatory Visit (HOSPITAL_BASED_OUTPATIENT_CLINIC_OR_DEPARTMENT_OTHER): Payer: 59 | Admitting: Hematology and Oncology

## 2015-11-29 ENCOUNTER — Ambulatory Visit (HOSPITAL_BASED_OUTPATIENT_CLINIC_OR_DEPARTMENT_OTHER): Payer: 59

## 2015-11-29 ENCOUNTER — Telehealth: Payer: Self-pay | Admitting: Hematology and Oncology

## 2015-11-29 ENCOUNTER — Other Ambulatory Visit (HOSPITAL_BASED_OUTPATIENT_CLINIC_OR_DEPARTMENT_OTHER): Payer: 59

## 2015-11-29 DIAGNOSIS — D701 Agranulocytosis secondary to cancer chemotherapy: Secondary | ICD-10-CM | POA: Diagnosis not present

## 2015-11-29 DIAGNOSIS — C50411 Malignant neoplasm of upper-outer quadrant of right female breast: Secondary | ICD-10-CM | POA: Diagnosis not present

## 2015-11-29 DIAGNOSIS — Z5111 Encounter for antineoplastic chemotherapy: Secondary | ICD-10-CM | POA: Diagnosis not present

## 2015-11-29 LAB — COMPREHENSIVE METABOLIC PANEL
ALBUMIN: 3.7 g/dL (ref 3.5–5.0)
ALK PHOS: 81 U/L (ref 40–150)
ALT: 35 U/L (ref 0–55)
ANION GAP: 10 meq/L (ref 3–11)
AST: 20 U/L (ref 5–34)
BUN: 11.9 mg/dL (ref 7.0–26.0)
CALCIUM: 9.5 mg/dL (ref 8.4–10.4)
CHLORIDE: 108 meq/L (ref 98–109)
CO2: 26 mEq/L (ref 22–29)
CREATININE: 0.8 mg/dL (ref 0.6–1.1)
EGFR: 89 mL/min/{1.73_m2} — ABNORMAL LOW (ref >90)
Glucose: 66 mg/dl — ABNORMAL LOW (ref 70–140)
POTASSIUM: 4.1 meq/L (ref 3.5–5.1)
Sodium: 144 mEq/L (ref 136–145)
Total Bilirubin: 0.4 mg/dL (ref 0.20–1.20)
Total Protein: 6.7 g/dL (ref 6.4–8.3)

## 2015-11-29 LAB — CBC WITH DIFFERENTIAL/PLATELET
BASO%: 0.7 % (ref 0.0–2.0)
BASOS ABS: 0 10*3/uL (ref 0.0–0.1)
EOS ABS: 0 10*3/uL (ref 0.0–0.5)
EOS%: 1.5 % (ref 0.0–7.0)
HEMATOCRIT: 35.1 % (ref 34.8–46.6)
HEMOGLOBIN: 11.6 g/dL (ref 11.6–15.9)
LYMPH#: 0.7 10*3/uL — AB (ref 0.9–3.3)
LYMPH%: 23.9 % (ref 14.0–49.7)
MCH: 33.5 pg (ref 25.1–34.0)
MCHC: 33.1 g/dL (ref 31.5–36.0)
MCV: 101.2 fL — ABNORMAL HIGH (ref 79.5–101.0)
MONO#: 0.3 10*3/uL (ref 0.1–0.9)
MONO%: 8.8 % (ref 0.0–14.0)
NEUT#: 1.9 10*3/uL (ref 1.5–6.5)
NEUT%: 65.1 % (ref 38.4–76.8)
PLATELETS: 209 10*3/uL (ref 145–400)
RBC: 3.47 10*6/uL — ABNORMAL LOW (ref 3.70–5.45)
RDW: 14.4 % (ref 11.2–14.5)
WBC: 3 10*3/uL — ABNORMAL LOW (ref 3.9–10.3)

## 2015-11-29 MED ORDER — SODIUM CHLORIDE 0.9% FLUSH
10.0000 mL | INTRAVENOUS | Status: DC | PRN
  Administered 2015-11-29: 10 mL
  Filled 2015-11-29: qty 10

## 2015-11-29 MED ORDER — PACLITAXEL PROTEIN-BOUND CHEMO INJECTION 100 MG
60.0000 mg/m2 | Freq: Once | INTRAVENOUS | Status: AC
  Administered 2015-11-29: 100 mg via INTRAVENOUS
  Filled 2015-11-29: qty 20

## 2015-11-29 MED ORDER — PROCHLORPERAZINE MALEATE 10 MG PO TABS
ORAL_TABLET | ORAL | Status: AC
  Filled 2015-11-29: qty 1

## 2015-11-29 MED ORDER — HEPARIN SOD (PORK) LOCK FLUSH 100 UNIT/ML IV SOLN
500.0000 [IU] | Freq: Once | INTRAVENOUS | Status: AC | PRN
  Administered 2015-11-29: 500 [IU]
  Filled 2015-11-29: qty 5

## 2015-11-29 MED ORDER — SODIUM CHLORIDE 0.9 % IV SOLN
Freq: Once | INTRAVENOUS | Status: AC
  Administered 2015-11-29: 10:00:00 via INTRAVENOUS

## 2015-11-29 MED ORDER — PROCHLORPERAZINE MALEATE 10 MG PO TABS
10.0000 mg | ORAL_TABLET | Freq: Once | ORAL | Status: AC
  Administered 2015-11-29: 10 mg via ORAL

## 2015-11-29 NOTE — Assessment & Plan Note (Signed)
Right mammogram and ultrasound 07/28/2015 Right breast mass in the axillary tail 1.7 x 1.5 x 1.3 cm axillary ultrasound negative, T1c N0 stage IA clinical stage Right breast biopsy 07/11/2015: 10:00 position: Invasive ductal carcinoma grade 3, ER 0%, PR 0%, Ki-67 95%, HER-2 negative ratio 1.21  Treatment planbased on multidisciplinary tumor board: 1. Neoadjuvant chemotherapy with Adriamycin and Cytoxan dose dense 4 followed by Taxol weekly 12 2. Followed by breast conserving surgery with sentinel lymph node study 3. Followed by adjuvant radiation therapy ------------------------------------------------------------------------------------------------------------------------------ Current treatment: Completed 4 cycles of dose dense Adriamycin and Cytoxan, today is cycle 9/12 Abraxane (treatment change from Taxol to Abraxane after cycle 1) Lab work was reviewed Echocardiogram 08/01/2015: EF 65-70% Breast MRI: 07/30/2015: right breast mass 2.4 x 2.1 x 2.1 cm with a preserved fat plane between the mass in the chest wall, 5 mm enhancing mass in the right breast at 12:00 C,biopsy-proven fibroadenoma.  Chemotherapy toxicities: 1. Neutropenia: During Adriamycin and Cytoxan, related to malfunctioning of onpro.  2. Skin rash: Treated with Keflex for 1 week. Resolved 3. Alopecia 4. Blisters underneath the feet: Her blisters are slowly improving. 5. Anemia grade 1: Resolved Patient denies neuropathy symptoms.  Denies any nausea or vomiting. Return to clinic in 2 weeks for cycle 11/12 Abraxane

## 2015-11-29 NOTE — Telephone Encounter (Signed)
appt made and avs printed. Pt to call and schedule mri per LOS

## 2015-11-29 NOTE — Patient Instructions (Signed)
[facility] Discharge Instructions for Patients Receiving Chemotherapy  Today you received the following chemotherapy agents Abraxane To help prevent nausea and vomiting after your treatment, we encourage you to take your nausea medication as prescribed.   If you develop nausea and vomiting that is not controlled by your nausea medication, call the clinic.   BELOW ARE SYMPTOMS THAT SHOULD BE REPORTED IMMEDIATELY:  *FEVER GREATER THAN 100.5 F  *CHILLS WITH OR WITHOUT FEVER  NAUSEA AND VOMITING THAT IS NOT CONTROLLED WITH YOUR NAUSEA MEDICATION  *UNUSUAL SHORTNESS OF BREATH  *UNUSUAL BRUISING OR BLEEDING  TENDERNESS IN MOUTH AND THROAT WITH OR WITHOUT PRESENCE OF ULCERS  *URINARY PROBLEMS  *BOWEL PROBLEMS  UNUSUAL RASH Items with * indicate a potential emergency and should be followed up as soon as possible.  Feel free to call the clinic you have any questions or concerns. The clinic phone number is [phone].  Please show the CHEMO ALERT CARD at check-in to the Emergency Department and triage nurse.   

## 2015-11-29 NOTE — Progress Notes (Signed)
Patient Care Team: Vania Rea, MD as PCP - General (Obstetrics and Gynecology)  DIAGNOSIS: Breast cancer of upper-outer quadrant of right female breast [facility])   Staging form: Breast, AJCC 7th Edition   - Clinical stage from 07/24/2015: Stage IA (T1c, N0, M0) - Unsigned  SUMMARY OF ONCOLOGIC HISTORY:   Breast cancer of upper-outer quadrant of right female breast ([place])   07/11/2015 Mammogram    Right breast mass in the axillary tail 1.7 x 1.5 x 1.3 cm axillary ultrasound negative, T1c N0 stage IA clinical stage      07/11/2015 Initial Diagnosis    Right breast biopsy 10:00 position: Invasive ductal carcinoma grade 3, ER 0%, P of 0%, Ki-67 95%, HER-2 negative ratio 1.21; right breast biopsy 12:00: Fibrocystic changes      08/02/2015 -  Neo-Adjuvant Chemotherapy    Dose dense Adriamycin and Cytoxan 4 followed by Taxol, switched to Abraxane with cycle 2       CHIEF COMPLIANT: Cycle 9 Abraxane  INTERVAL HISTORY: Courtney Gomez is a 50 year old with above-mentioned history of right breast cancer currently on neoadjuvant chemotherapy today is cycle 9 of Abraxane. She appears to be tolerating the treatment fairly well. She denies any nausea vomiting. She denies any neuropathy.  REVIEW OF SYSTEMS:   Constitutional: Denies fevers, chills or abnormal weight loss Eyes: Denies blurriness of vision Ears, nose, mouth, throat, and face: Denies mucositis or sore throat Respiratory: Denies cough, dyspnea or wheezes Cardiovascular: Denies palpitation, chest discomfort Gastrointestinal:  Denies nausea, heartburn or change in bowel habits Skin: Denies abnormal skin rashes Lymphatics: Denies new lymphadenopathy or easy bruising Neurological:Denies numbness, tingling or new weaknesses Behavioral/Psych: Mood is stable, no new changes  Extremities: No lower extremity edema  All other systems were reviewed with the patient and are negative.  I have reviewed the past medical history, past surgical  history, social history and family history with the patient and they are unchanged from previous note.  ALLERGIES:  is allergic to lanolin and latex.  MEDICATIONS:  Current Outpatient Prescriptions  Medication Sig Dispense Refill  . ADVAIR DISKUS 250-50 MCG/DOSE AEPB Inhale 1 puff into the lungs daily.   5  . albuterol (PROAIR HFA) 108 (90 BASE) MCG/ACT inhaler Inhale 2 puffs into the lungs every 6 (six) hours as needed for wheezing or shortness of breath. Reported on 08/23/2015    . cetirizine (ZYRTEC) 10 MG tablet Take 10 mg by mouth daily. Reported on 08/23/2015    . fluticasone (FLONASE) 50 MCG/ACT nasal spray Place 1 spray into both nostrils daily. 16 g 3  . gabapentin (NEURONTIN) 300 MG capsule Take 2 capsules (600 mg total) by mouth 3 (three) times daily. 180 capsule 3  . LORazepam (ATIVAN) 0.5 MG tablet Take 1 tablet (0.5 mg total) by mouth at bedtime as needed. 30 tablet 0  . montelukast (SINGULAIR) 10 MG tablet TAKE 1 TABLET (10 MG TOTAL) BY MOUTH AT BEDTIME. 30 tablet 3   No current facility-administered medications for this visit.     PHYSICAL EXAMINATION: ECOG PERFORMANCE STATUS: 1 - Symptomatic but completely ambulatory  There were no vitals filed for this visit. There were no vitals filed for this visit.  GENERAL:alert, no distress and comfortable SKIN: skin color, texture, turgor are normal, no rashes or significant lesions EYES: normal, Conjunctiva are pink and non-injected, sclera clear OROPHARYNX:no exudate, no erythema and lips, buccal mucosa, and tongue normal  NECK: supple, thyroid normal size, non-tender, without nodularity LYMPH:  no palpable lymphadenopathy in  the cervical, axillary or inguinal LUNGS: clear to auscultation and percussion with normal breathing effort HEART: regular rate & rhythm and no murmurs and no lower extremity edema ABDOMEN:abdomen soft, non-tender and normal bowel sounds MUSCULOSKELETAL:no cyanosis of digits and no clubbing  NEURO: alert  & oriented x 3 with fluent speech, no focal motor/sensory deficits EXTREMITIES: No lower extremity edema BREAST: No palpable masses or nodules in either right or left breasts. No palpable axillary supraclavicular or infraclavicular adenopathy no breast tenderness or nipple discharge. (exam performed in the presence of a chaperone)  LABORATORY DATA:  I have reviewed the data as listed   Chemistry      Component Value Date/Time   NA 143 11/22/2015 1414   K 3.8 11/22/2015 1414   CO2 27 11/22/2015 1414   BUN 10.6 11/22/2015 1414   CREATININE 0.8 11/22/2015 1414      Component Value Date/Time   CALCIUM 9.4 11/22/2015 1414   ALKPHOS 87 11/22/2015 1414   AST 26 11/22/2015 1414   ALT 40 11/22/2015 1414   BILITOT 0.44 11/22/2015 1414       Lab Results  Component Value Date   WBC 3.0 (L) 11/29/2015   HGB 11.6 11/29/2015   HCT 35.1 11/29/2015   MCV 101.2 (H) 11/29/2015   PLT 209 11/29/2015   NEUTROABS 1.9 11/29/2015     ASSESSMENT & PLAN:  Breast cancer of upper-outer quadrant of right female breast ([place]) Right mammogram and ultrasound 07/28/2015 Right breast mass in the axillary tail 1.7 x 1.5 x 1.3 cm axillary ultrasound negative, T1c N0 stage IA clinical stage Right breast biopsy 07/11/2015: 10:00 position: Invasive ductal carcinoma grade 3, ER 0%, PR 0%, Ki-67 95%, HER-2 negative ratio 1.21  Treatment planbased on multidisciplinary tumor board: 1. Neoadjuvant chemotherapy with Adriamycin and Cytoxan dose dense 4 followed by Taxol weekly 12 2. Followed by breast conserving surgery with sentinel lymph node study 3. Followed by adjuvant radiation therapy ------------------------------------------------------------------------------------------------------------------------------ Current treatment: Completed 4 cycles of dose dense Adriamycin and Cytoxan, today is cycle 9/12 Abraxane (treatment change from Taxol to Abraxane after cycle 1) Lab work was reviewed Echocardiogram  08/01/2015: EF 65-70% Breast MRI: 07/30/2015: right breast mass 2.4 x 2.1 x 2.1 cm with a preserved fat plane between the mass in the chest wall, 5 mm enhancing mass in the right breast at 12:00 C,biopsy-proven fibroadenoma.  Chemotherapy toxicities: 1. Neutropenia: During Adriamycin and Cytoxan, related to malfunctioning of onpro.  2. Skin rash: Treated with Keflex for 1 week. Resolved 3. Alopecia 4. Blisters underneath the feet: Her blisters are slowly improving. 5. Anemia grade 1: Resolved Patient denies neuropathy symptoms.  Denies any nausea or vomiting. Return to clinic in 2 weeks for cycle 11/ 12 Abraxane Breast MRI will be scheduled for 12/23/2015, tumor board presentation 12/25/2015   No orders of the defined types were placed in this encounter.  The patient has a good understanding of the overall plan. she agrees with it. she will call with any problems that may develop before the next visit here.   Rulon Eisenmenger, MD 11/29/15

## 2015-12-06 ENCOUNTER — Ambulatory Visit (HOSPITAL_BASED_OUTPATIENT_CLINIC_OR_DEPARTMENT_OTHER): Payer: 59

## 2015-12-06 ENCOUNTER — Other Ambulatory Visit (HOSPITAL_BASED_OUTPATIENT_CLINIC_OR_DEPARTMENT_OTHER): Payer: 59

## 2015-12-06 VITALS — BP 141/80 | HR 80 | Temp 98.2°F | Resp 18

## 2015-12-06 DIAGNOSIS — C50411 Malignant neoplasm of upper-outer quadrant of right female breast: Secondary | ICD-10-CM | POA: Diagnosis not present

## 2015-12-06 DIAGNOSIS — Z5111 Encounter for antineoplastic chemotherapy: Secondary | ICD-10-CM | POA: Diagnosis not present

## 2015-12-06 LAB — COMPREHENSIVE METABOLIC PANEL
ALBUMIN: 3.8 g/dL (ref 3.5–5.0)
ALK PHOS: 79 U/L (ref 40–150)
ALT: 35 U/L (ref 0–55)
ANION GAP: 9 meq/L (ref 3–11)
AST: 22 U/L (ref 5–34)
BILIRUBIN TOTAL: 0.44 mg/dL (ref 0.20–1.20)
BUN: 15.1 mg/dL (ref 7.0–26.0)
CALCIUM: 9.7 mg/dL (ref 8.4–10.4)
CO2: 28 meq/L (ref 22–29)
CREATININE: 0.8 mg/dL (ref 0.6–1.1)
Chloride: 106 mEq/L (ref 98–109)
EGFR: 87 mL/min/{1.73_m2} — AB (ref >90)
Glucose: 110 mg/dl (ref 70–140)
Potassium: 3.9 mEq/L (ref 3.5–5.1)
Sodium: 143 mEq/L (ref 136–145)
TOTAL PROTEIN: 7.1 g/dL (ref 6.4–8.3)

## 2015-12-06 LAB — CBC WITH DIFFERENTIAL/PLATELET
BASO%: 0.7 % (ref 0.0–2.0)
BASOS ABS: 0 10*3/uL (ref 0.0–0.1)
EOS%: 2 % (ref 0.0–7.0)
Eosinophils Absolute: 0.1 10*3/uL (ref 0.0–0.5)
HCT: 36.1 % (ref 34.8–46.6)
HGB: 12 g/dL (ref 11.6–15.9)
LYMPH%: 24 % (ref 14.0–49.7)
MCH: 33.4 pg (ref 25.1–34.0)
MCHC: 33.1 g/dL (ref 31.5–36.0)
MCV: 101 fL (ref 79.5–101.0)
MONO#: 0.4 10*3/uL (ref 0.1–0.9)
MONO%: 10.1 % (ref 0.0–14.0)
NEUT#: 2.4 10*3/uL (ref 1.5–6.5)
NEUT%: 63.2 % (ref 38.4–76.8)
Platelets: 245 10*3/uL (ref 145–400)
RBC: 3.58 10*6/uL — ABNORMAL LOW (ref 3.70–5.45)
RDW: 14.4 % (ref 11.2–14.5)
WBC: 3.9 10*3/uL (ref 3.9–10.3)
lymph#: 0.9 10*3/uL (ref 0.9–3.3)

## 2015-12-06 MED ORDER — PROCHLORPERAZINE MALEATE 10 MG PO TABS
ORAL_TABLET | ORAL | Status: AC
  Filled 2015-12-06: qty 1

## 2015-12-06 MED ORDER — PACLITAXEL PROTEIN-BOUND CHEMO INJECTION 100 MG
60.0000 mg/m2 | Freq: Once | INTRAVENOUS | Status: AC
  Administered 2015-12-06: 100 mg via INTRAVENOUS
  Filled 2015-12-06: qty 20

## 2015-12-06 MED ORDER — PROCHLORPERAZINE MALEATE 10 MG PO TABS
10.0000 mg | ORAL_TABLET | Freq: Once | ORAL | Status: AC
  Administered 2015-12-06: 10 mg via ORAL

## 2015-12-06 MED ORDER — SODIUM CHLORIDE 0.9 % IV SOLN
Freq: Once | INTRAVENOUS | Status: AC
  Administered 2015-12-06: 15:00:00 via INTRAVENOUS

## 2015-12-06 MED ORDER — HEPARIN SOD (PORK) LOCK FLUSH 100 UNIT/ML IV SOLN
500.0000 [IU] | Freq: Once | INTRAVENOUS | Status: AC | PRN
  Administered 2015-12-06: 500 [IU]
  Filled 2015-12-06: qty 5

## 2015-12-06 MED ORDER — SODIUM CHLORIDE 0.9% FLUSH
10.0000 mL | INTRAVENOUS | Status: DC | PRN
  Administered 2015-12-06: 10 mL
  Filled 2015-12-06: qty 10

## 2015-12-06 NOTE — Patient Instructions (Signed)
[facility] Discharge Instructions for Patients Receiving Chemotherapy  Today you received the following chemotherapy agents Abraxane To help prevent nausea and vomiting after your treatment, we encourage you to take your nausea medication as prescribed.   If you develop nausea and vomiting that is not controlled by your nausea medication, call the clinic.   BELOW ARE SYMPTOMS THAT SHOULD BE REPORTED IMMEDIATELY:  *FEVER GREATER THAN 100.5 F  *CHILLS WITH OR WITHOUT FEVER  NAUSEA AND VOMITING THAT IS NOT CONTROLLED WITH YOUR NAUSEA MEDICATION  *UNUSUAL SHORTNESS OF BREATH  *UNUSUAL BRUISING OR BLEEDING  TENDERNESS IN MOUTH AND THROAT WITH OR WITHOUT PRESENCE OF ULCERS  *URINARY PROBLEMS  *BOWEL PROBLEMS  UNUSUAL RASH Items with * indicate a potential emergency and should be followed up as soon as possible.  Feel free to call the clinic you have any questions or concerns. The clinic phone number is [phone].  Please show the CHEMO ALERT CARD at check-in to the Emergency Department and triage nurse.   

## 2015-12-13 ENCOUNTER — Ambulatory Visit (HOSPITAL_BASED_OUTPATIENT_CLINIC_OR_DEPARTMENT_OTHER): Payer: 59

## 2015-12-13 ENCOUNTER — Other Ambulatory Visit (HOSPITAL_BASED_OUTPATIENT_CLINIC_OR_DEPARTMENT_OTHER): Payer: 59

## 2015-12-13 ENCOUNTER — Encounter: Payer: Self-pay | Admitting: Hematology and Oncology

## 2015-12-13 ENCOUNTER — Ambulatory Visit (HOSPITAL_BASED_OUTPATIENT_CLINIC_OR_DEPARTMENT_OTHER): Payer: 59 | Admitting: Hematology and Oncology

## 2015-12-13 ENCOUNTER — Encounter: Payer: Self-pay | Admitting: *Deleted

## 2015-12-13 DIAGNOSIS — Z5111 Encounter for antineoplastic chemotherapy: Secondary | ICD-10-CM

## 2015-12-13 DIAGNOSIS — R21 Rash and other nonspecific skin eruption: Secondary | ICD-10-CM

## 2015-12-13 DIAGNOSIS — C50411 Malignant neoplasm of upper-outer quadrant of right female breast: Secondary | ICD-10-CM

## 2015-12-13 DIAGNOSIS — D701 Agranulocytosis secondary to cancer chemotherapy: Secondary | ICD-10-CM

## 2015-12-13 LAB — CBC WITH DIFFERENTIAL/PLATELET
BASO%: 0.4 % (ref 0.0–2.0)
Basophils Absolute: 0 10*3/uL (ref 0.0–0.1)
EOS ABS: 0.1 10*3/uL (ref 0.0–0.5)
EOS%: 2 % (ref 0.0–7.0)
HCT: 36.2 % (ref 34.8–46.6)
HGB: 12 g/dL (ref 11.6–15.9)
LYMPH%: 23 % (ref 14.0–49.7)
MCH: 32.9 pg (ref 25.1–34.0)
MCHC: 33 g/dL (ref 31.5–36.0)
MCV: 99.5 fL (ref 79.5–101.0)
MONO#: 0.3 10*3/uL (ref 0.1–0.9)
MONO%: 10.4 % (ref 0.0–14.0)
NEUT#: 2.1 10*3/uL (ref 1.5–6.5)
NEUT%: 64.2 % (ref 38.4–76.8)
PLATELETS: 208 10*3/uL (ref 145–400)
RBC: 3.64 10*6/uL — AB (ref 3.70–5.45)
RDW: 14.6 % — ABNORMAL HIGH (ref 11.2–14.5)
WBC: 3.3 10*3/uL — AB (ref 3.9–10.3)
lymph#: 0.8 10*3/uL — ABNORMAL LOW (ref 0.9–3.3)

## 2015-12-13 LAB — COMPREHENSIVE METABOLIC PANEL
ALT: 31 U/L (ref 0–55)
ANION GAP: 9 meq/L (ref 3–11)
AST: 21 U/L (ref 5–34)
Albumin: 3.6 g/dL (ref 3.5–5.0)
Alkaline Phosphatase: 86 U/L (ref 40–150)
BUN: 10.8 mg/dL (ref 7.0–26.0)
CHLORIDE: 108 meq/L (ref 98–109)
CO2: 27 meq/L (ref 22–29)
Calcium: 9.5 mg/dL (ref 8.4–10.4)
Creatinine: 0.8 mg/dL (ref 0.6–1.1)
GLUCOSE: 68 mg/dL — AB (ref 70–140)
Potassium: 3.9 mEq/L (ref 3.5–5.1)
SODIUM: 144 meq/L (ref 136–145)
Total Bilirubin: 0.48 mg/dL (ref 0.20–1.20)
Total Protein: 6.7 g/dL (ref 6.4–8.3)

## 2015-12-13 MED ORDER — PACLITAXEL PROTEIN-BOUND CHEMO INJECTION 100 MG
60.0000 mg/m2 | Freq: Once | INTRAVENOUS | Status: AC
  Administered 2015-12-13: 100 mg via INTRAVENOUS
  Filled 2015-12-13: qty 20

## 2015-12-13 MED ORDER — PROCHLORPERAZINE MALEATE 10 MG PO TABS
ORAL_TABLET | ORAL | Status: AC
  Filled 2015-12-13: qty 1

## 2015-12-13 MED ORDER — HEPARIN SOD (PORK) LOCK FLUSH 100 UNIT/ML IV SOLN
500.0000 [IU] | Freq: Once | INTRAVENOUS | Status: AC | PRN
  Administered 2015-12-13: 500 [IU]
  Filled 2015-12-13: qty 5

## 2015-12-13 MED ORDER — SODIUM CHLORIDE 0.9 % IV SOLN
Freq: Once | INTRAVENOUS | Status: AC
  Administered 2015-12-13: 10:00:00 via INTRAVENOUS

## 2015-12-13 MED ORDER — SODIUM CHLORIDE 0.9% FLUSH
10.0000 mL | INTRAVENOUS | Status: DC | PRN
  Administered 2015-12-13: 10 mL
  Filled 2015-12-13: qty 10

## 2015-12-13 MED ORDER — PROCHLORPERAZINE MALEATE 10 MG PO TABS
10.0000 mg | ORAL_TABLET | Freq: Once | ORAL | Status: AC
  Administered 2015-12-13: 10 mg via ORAL

## 2015-12-13 NOTE — Patient Instructions (Signed)
[facility] Discharge Instructions for Patients Receiving Chemotherapy  Today you received the following chemotherapy agents: Abraxane   To help prevent nausea and vomiting after your treatment, we encourage you to take your nausea medication as directed.    If you develop nausea and vomiting that is not controlled by your nausea medication, call the clinic.   BELOW ARE SYMPTOMS THAT SHOULD BE REPORTED IMMEDIATELY:  *FEVER GREATER THAN 100.5 F  *CHILLS WITH OR WITHOUT FEVER  NAUSEA AND VOMITING THAT IS NOT CONTROLLED WITH YOUR NAUSEA MEDICATION  *UNUSUAL SHORTNESS OF BREATH  *UNUSUAL BRUISING OR BLEEDING  TENDERNESS IN MOUTH AND THROAT WITH OR WITHOUT PRESENCE OF ULCERS  *URINARY PROBLEMS  *BOWEL PROBLEMS  UNUSUAL RASH Items with * indicate a potential emergency and should be followed up as soon as possible.  Feel free to call the clinic you have any questions or concerns. The clinic phone number is [phone].  Please show the CHEMO ALERT CARD at check-in to the Emergency Department and triage nurse.   

## 2015-12-13 NOTE — Progress Notes (Signed)
Patient Care Team: Vania Rea, MD as PCP - General (Obstetrics and Gynecology)  DIAGNOSIS: Breast cancer of upper-outer quadrant of right female breast [facility])   Staging form: Breast, AJCC 7th Edition   - Clinical stage from 07/24/2015: Stage IA (T1c, N0, M0) - Unsigned  SUMMARY OF ONCOLOGIC HISTORY:   Breast cancer of upper-outer quadrant of right female breast ([place])   07/11/2015 Mammogram    Right breast mass in the axillary tail 1.7 x 1.5 x 1.3 cm axillary ultrasound negative, T1c N0 stage IA clinical stage      07/11/2015 Initial Diagnosis    Right breast biopsy 10:00 position: Invasive ductal carcinoma grade 3, ER 0%, P of 0%, Ki-67 95%, HER-2 negative ratio 1.21; right breast biopsy 12:00: Fibrocystic changes      08/02/2015 -  Neo-Adjuvant Chemotherapy    Dose dense Adriamycin and Cytoxan 4 followed by Taxol, switched to Abraxane with cycle 2      CHIEF COMPLIANT: Abraxane cycle 11  INTERVAL HISTORY: Courtney Gomez is a 50 year old with above-mentioned history of right breast cancer currently on neoadjuvant chemotherapy and today is cycle 11 of Abraxane. She is tolerating chemotherapy extremely well. She denies any nausea or vomiting. Denies any neuropathy. Continues to have fatigue issues.  REVIEW OF SYSTEMS:   Constitutional: Denies fevers, chills or abnormal weight loss Eyes: Denies blurriness of vision Ears, nose, mouth, throat, and face: Denies mucositis or sore throat Respiratory: Denies cough, dyspnea or wheezes Cardiovascular: Denies palpitation, chest discomfort Gastrointestinal:  Denies nausea, heartburn or change in bowel habits Skin: Denies abnormal skin rashes Lymphatics: Denies new lymphadenopathy or easy bruising Neurological:Denies numbness, tingling or new weaknesses Behavioral/Psych: Mood is stable, no new changes  Extremities: No lower extremity edema Breast: Does not feel the lump anymore All other systems were reviewed with the patient and are  negative.  I have reviewed the past medical history, past surgical history, social history and family history with the patient and they are unchanged from previous note.  ALLERGIES:  is allergic to lanolin and latex.  MEDICATIONS:  Current Outpatient Prescriptions  Medication Sig Dispense Refill  . ADVAIR DISKUS 250-50 MCG/DOSE AEPB Inhale 1 puff into the lungs daily.   5  . albuterol (PROAIR HFA) 108 (90 BASE) MCG/ACT inhaler Inhale 2 puffs into the lungs every 6 (six) hours as needed for wheezing or shortness of breath. Reported on 08/23/2015    . cetirizine (ZYRTEC) 10 MG tablet Take 10 mg by mouth daily. Reported on 08/23/2015    . fluticasone (FLONASE) 50 MCG/ACT nasal spray Place 1 spray into both nostrils daily. 16 g 3  . gabapentin (NEURONTIN) 300 MG capsule Take 2 capsules (600 mg total) by mouth 3 (three) times daily. 180 capsule 3  . LORazepam (ATIVAN) 0.5 MG tablet Take 1 tablet (0.5 mg total) by mouth at bedtime as needed. 30 tablet 0  . montelukast (SINGULAIR) 10 MG tablet TAKE 1 TABLET (10 MG TOTAL) BY MOUTH AT BEDTIME. 30 tablet 3   No current facility-administered medications for this visit.     PHYSICAL EXAMINATION: ECOG PERFORMANCE STATUS: 1 - Symptomatic but completely ambulatory  Vitals:   12/13/15 0835  BP: (!) 143/78  Pulse: 95  Resp: 18  Temp: 98.1 F (36.7 C)   Filed Weights   12/13/15 0835  Weight: 141 lb 14.4 oz (64.4 kg)    GENERAL:alert, no distress and comfortable SKIN: skin color, texture, turgor are normal, no rashes or significant lesions EYES: normal, Conjunctiva are  pink and non-injected, sclera clear OROPHARYNX:no exudate, no erythema and lips, buccal mucosa, and tongue normal  NECK: supple, thyroid normal size, non-tender, without nodularity LYMPH:  no palpable lymphadenopathy in the cervical, axillary or inguinal LUNGS: clear to auscultation and percussion with normal breathing effort HEART: regular rate & rhythm and no murmurs and no  lower extremity edema ABDOMEN:abdomen soft, non-tender and normal bowel sounds MUSCULOSKELETAL:no cyanosis of digits and no clubbing  NEURO: alert & oriented x 3 with fluent speech, no focal motor/sensory deficits EXTREMITIES: No lower extremity edema  LABORATORY DATA:  I have reviewed the data as listed   Chemistry      Component Value Date/Time   NA 144 12/13/2015 0820   K 3.9 12/13/2015 0820   CO2 27 12/13/2015 0820   BUN 10.8 12/13/2015 0820   CREATININE 0.8 12/13/2015 0820      Component Value Date/Time   CALCIUM 9.5 12/13/2015 0820   ALKPHOS 86 12/13/2015 0820   AST 21 12/13/2015 0820   ALT 31 12/13/2015 0820   BILITOT 0.48 12/13/2015 0820       Lab Results  Component Value Date   WBC 3.3 (L) 12/13/2015   HGB 12.0 12/13/2015   HCT 36.2 12/13/2015   MCV 99.5 12/13/2015   PLT 208 12/13/2015   NEUTROABS 2.1 12/13/2015     ASSESSMENT & PLAN:  Breast cancer of upper-outer quadrant of right female breast ([facility]) Right mammogram and ultrasound 07/28/2015 Right breast mass in the axillary tail 1.7 x 1.5 x 1.3 cm axillary ultrasound negative, T1c N0 stage IA clinical stage Right breast biopsy 07/11/2015: 10:00 position: Invasive ductal carcinoma grade 3, ER 0%, PR 0%, Ki-67 95%, HER-2 negative ratio 1.21  Treatment planbased on multidisciplinary tumor board: 1. Neoadjuvant chemotherapy with Adriamycin and Cytoxan dose dense 4 followed by Taxol weekly 12 2. Followed by breast conserving surgery with sentinel lymph node study 3. Followed by adjuvant radiation therapy ------------------------------------------------------------------------------------------------------------------------------ Current treatment: Completed 4 cycles of dose dense Adriamycin and Cytoxan, today is cycle 11/12 Abraxane (treatment change from Taxol to Abraxane after cycle 1) Lab work was reviewed Echocardiogram 08/01/2015: EF 65-70% Breast MRI: 07/30/2015: right breast mass 2.4 x 2.1 x 2.1 cm  with a preserved fat plane between the mass in the chest wall, 5 mm enhancing mass in the right breast at 12:00 C,biopsy-proven fibroadenoma.  Chemotherapy toxicities: 1. Neutropenia: During Adriamycin and Cytoxan, related to malfunctioning of onpro.  2. Skin rash: Treated with Keflex for 1 week. Resolved 3. Alopecia 4. Blisters underneath the feet: Her blisters are slowly improving. 5. Anemia grade 1: Resolved Patient denies neuropathy symptoms.  Denies any nausea or vomiting. Return to clinic for Breast MRI which was scheduled for 12/23/2015, tumor board presentation 12/25/2015   No orders of the defined types were placed in this encounter.  The patient has a good understanding of the overall plan. she agrees with it. she will call with any problems that may develop before the next visit here.   Gudena, Vinay K, MD 12/13/15    

## 2015-12-13 NOTE — Assessment & Plan Note (Signed)
Right mammogram and ultrasound 07/28/2015 Right breast mass in the axillary tail 1.7 x 1.5 x 1.3 cm axillary ultrasound negative, T1c N0 stage IA clinical stage Right breast biopsy 07/11/2015: 10:00 position: Invasive ductal carcinoma grade 3, ER 0%, PR 0%, Ki-67 95%, HER-2 negative ratio 1.21  Treatment planbased on multidisciplinary tumor board: 1. Neoadjuvant chemotherapy with Adriamycin and Cytoxan dose dense 4 followed by Taxol weekly 12 2. Followed by breast conserving surgery with sentinel lymph node study 3. Followed by adjuvant radiation therapy ------------------------------------------------------------------------------------------------------------------------------ Current treatment: Completed 4 cycles of dose dense Adriamycin and Cytoxan, today is cycle 11/12 Abraxane (treatment change from Taxol to Abraxane after cycle 1) Lab work was reviewed Echocardiogram 08/01/2015: EF 65-70% Breast MRI: 07/30/2015: right breast mass 2.4 x 2.1 x 2.1 cm with a preserved fat plane between the mass in the chest wall, 5 mm enhancing mass in the right breast at 12:00 C,biopsy-proven fibroadenoma.  Chemotherapy toxicities: 1. Neutropenia: During Adriamycin and Cytoxan, related to malfunctioning of onpro.  2. Skin rash: Treated with Keflex for 1 week. Resolved 3. Alopecia 4. Blisters underneath the feet: Her blisters are slowly improving. 5. Anemia grade 1: Resolved Patient denies neuropathy symptoms.  Denies any nausea or vomiting. Return to clinic for Breast MRI which was scheduled for 12/23/2015, tumor board presentation 12/25/2015

## 2015-12-16 ENCOUNTER — Other Ambulatory Visit: Payer: Self-pay | Admitting: Obstetrics & Gynecology

## 2015-12-17 LAB — CYTOLOGY - PAP

## 2015-12-18 ENCOUNTER — Telehealth: Payer: Self-pay | Admitting: *Deleted

## 2015-12-18 NOTE — Telephone Encounter (Signed)
Per breast navigator I have moved appts on 9/22 to eariler in the day. She will call the patient

## 2015-12-20 ENCOUNTER — Encounter: Payer: Self-pay | Admitting: *Deleted

## 2015-12-20 ENCOUNTER — Other Ambulatory Visit (HOSPITAL_BASED_OUTPATIENT_CLINIC_OR_DEPARTMENT_OTHER): Payer: 59

## 2015-12-20 ENCOUNTER — Ambulatory Visit (HOSPITAL_BASED_OUTPATIENT_CLINIC_OR_DEPARTMENT_OTHER): Payer: 59

## 2015-12-20 ENCOUNTER — Ambulatory Visit
Admission: RE | Admit: 2015-12-20 | Discharge: 2015-12-20 | Disposition: A | Discharge status: Home / Self Care | Payer: 59 | Source: Ambulatory Visit | Attending: Hematology and Oncology | Admitting: Hematology and Oncology

## 2015-12-20 VITALS — BP 118/68 | HR 89 | Temp 98.6°F | Resp 18

## 2015-12-20 DIAGNOSIS — C50411 Malignant neoplasm of upper-outer quadrant of right female breast: Secondary | ICD-10-CM

## 2015-12-20 DIAGNOSIS — Z5111 Encounter for antineoplastic chemotherapy: Secondary | ICD-10-CM | POA: Diagnosis not present

## 2015-12-20 LAB — CBC WITH DIFFERENTIAL/PLATELET
BASO%: 0.6 % (ref 0.0–2.0)
Basophils Absolute: 0 10*3/uL (ref 0.0–0.1)
EOS%: 2.6 % (ref 0.0–7.0)
Eosinophils Absolute: 0.1 10*3/uL (ref 0.0–0.5)
HCT: 35 % (ref 34.8–46.6)
HGB: 11.6 g/dL (ref 11.6–15.9)
LYMPH%: 24.9 % (ref 14.0–49.7)
MCH: 33 pg (ref 25.1–34.0)
MCHC: 33 g/dL (ref 31.5–36.0)
MCV: 99.9 fL (ref 79.5–101.0)
MONO#: 0.3 10*3/uL (ref 0.1–0.9)
MONO%: 8.7 % (ref 0.0–14.0)
NEUT#: 2.1 10*3/uL (ref 1.5–6.5)
NEUT%: 63.2 % (ref 38.4–76.8)
PLATELETS: 217 10*3/uL (ref 145–400)
RBC: 3.51 10*6/uL — AB (ref 3.70–5.45)
RDW: 14.3 % (ref 11.2–14.5)
WBC: 3.3 10*3/uL — ABNORMAL LOW (ref 3.9–10.3)
lymph#: 0.8 10*3/uL — ABNORMAL LOW (ref 0.9–3.3)

## 2015-12-20 LAB — COMPREHENSIVE METABOLIC PANEL
ALT: 29 U/L (ref 0–55)
ANION GAP: 9 meq/L (ref 3–11)
AST: 19 U/L (ref 5–34)
Albumin: 3.5 g/dL (ref 3.5–5.0)
Alkaline Phosphatase: 80 U/L (ref 40–150)
BUN: 10.7 mg/dL (ref 7.0–26.0)
CHLORIDE: 107 meq/L (ref 98–109)
CO2: 28 meq/L (ref 22–29)
CREATININE: 0.8 mg/dL (ref 0.6–1.1)
Calcium: 9.6 mg/dL (ref 8.4–10.4)
EGFR: 88 mL/min/{1.73_m2} — AB (ref >90)
Glucose: 104 mg/dl (ref 70–140)
POTASSIUM: 4.2 meq/L (ref 3.5–5.1)
Sodium: 144 mEq/L (ref 136–145)
Total Bilirubin: 0.42 mg/dL (ref 0.20–1.20)
Total Protein: 6.6 g/dL (ref 6.4–8.3)

## 2015-12-20 MED ORDER — HEPARIN SOD (PORK) LOCK FLUSH 100 UNIT/ML IV SOLN
500.0000 [IU] | Freq: Once | INTRAVENOUS | Status: AC | PRN
  Administered 2015-12-20: 500 [IU]
  Filled 2015-12-20: qty 5

## 2015-12-20 MED ORDER — PROCHLORPERAZINE MALEATE 10 MG PO TABS
ORAL_TABLET | ORAL | Status: AC
  Filled 2015-12-20: qty 1

## 2015-12-20 MED ORDER — SODIUM CHLORIDE 0.9 % IV SOLN
Freq: Once | INTRAVENOUS | Status: AC
  Administered 2015-12-20: 09:00:00 via INTRAVENOUS

## 2015-12-20 MED ORDER — SODIUM CHLORIDE 0.9% FLUSH
10.0000 mL | INTRAVENOUS | Status: DC | PRN
  Administered 2015-12-20: 10 mL
  Filled 2015-12-20: qty 10

## 2015-12-20 MED ORDER — PROCHLORPERAZINE MALEATE 10 MG PO TABS
10.0000 mg | ORAL_TABLET | Freq: Once | ORAL | Status: AC
  Administered 2015-12-20: 10 mg via ORAL

## 2015-12-20 MED ORDER — GADOBENATE DIMEGLUMINE 529 MG/ML IV SOLN
13.0000 mL | Freq: Once | INTRAVENOUS | Status: AC | PRN
  Administered 2015-12-20: 13 mL via INTRAVENOUS

## 2015-12-20 MED ORDER — PACLITAXEL PROTEIN-BOUND CHEMO INJECTION 100 MG
60.0000 mg/m2 | Freq: Once | INTRAVENOUS | Status: AC
  Administered 2015-12-20: 100 mg via INTRAVENOUS
  Filled 2015-12-20: qty 20

## 2015-12-20 NOTE — Patient Instructions (Signed)
[facility] Discharge Instructions for Patients Receiving Chemotherapy  Today you received the following chemotherapy agents: Abraxane   To help prevent nausea and vomiting after your treatment, we encourage you to take your nausea medication as directed.    If you develop nausea and vomiting that is not controlled by your nausea medication, call the clinic.   BELOW ARE SYMPTOMS THAT SHOULD BE REPORTED IMMEDIATELY:  *FEVER GREATER THAN 100.5 F  *CHILLS WITH OR WITHOUT FEVER  NAUSEA AND VOMITING THAT IS NOT CONTROLLED WITH YOUR NAUSEA MEDICATION  *UNUSUAL SHORTNESS OF BREATH  *UNUSUAL BRUISING OR BLEEDING  TENDERNESS IN MOUTH AND THROAT WITH OR WITHOUT PRESENCE OF ULCERS  *URINARY PROBLEMS  *BOWEL PROBLEMS  UNUSUAL RASH Items with * indicate a potential emergency and should be followed up as soon as possible.  Feel free to call the clinic you have any questions or concerns. The clinic phone number is [phone].  Please show the CHEMO ALERT CARD at check-in to the Emergency Department and triage nurse.   

## 2015-12-23 ENCOUNTER — Telehealth: Payer: Self-pay | Admitting: Hematology and Oncology

## 2015-12-23 ENCOUNTER — Encounter: Payer: Self-pay | Admitting: *Deleted

## 2015-12-23 NOTE — Telephone Encounter (Signed)
Call the patient to get the results of the breast MRI. It showed a complete radiologic response. I left a message on her voicemail.

## 2015-12-25 ENCOUNTER — Other Ambulatory Visit: Payer: 59

## 2015-12-25 ENCOUNTER — Encounter: Payer: Self-pay | Admitting: Hematology and Oncology

## 2015-12-25 ENCOUNTER — Ambulatory Visit: Payer: 59 | Admitting: Hematology and Oncology

## 2015-12-25 ENCOUNTER — Ambulatory Visit (HOSPITAL_BASED_OUTPATIENT_CLINIC_OR_DEPARTMENT_OTHER): Payer: 59 | Admitting: Hematology and Oncology

## 2015-12-25 ENCOUNTER — Encounter: Payer: Self-pay | Admitting: *Deleted

## 2015-12-25 DIAGNOSIS — C50411 Malignant neoplasm of upper-outer quadrant of right female breast: Secondary | ICD-10-CM

## 2015-12-25 NOTE — Progress Notes (Signed)
Patient Care Team: Vania Rea, MD as PCP - General (Obstetrics and Gynecology)  DIAGNOSIS: Breast cancer of upper-outer quadrant of right female breast [facility])   Staging form: Breast, AJCC 7th Edition   - Clinical stage from 07/24/2015: Stage IA (T1c, N0, M0) - Unsigned  SUMMARY OF ONCOLOGIC HISTORY:   Breast cancer of upper-outer quadrant of right female breast ([place])   07/11/2015 Mammogram    Right breast mass in the axillary tail 1.7 x 1.5 x 1.3 cm axillary ultrasound negative, T1c N0 stage IA clinical stage      07/11/2015 Initial Diagnosis    Right breast biopsy 10:00 position: Invasive ductal carcinoma grade 3, ER 0%, P of 0%, Ki-67 95%, HER-2 negative ratio 1.21; right breast biopsy 12:00: Fibrocystic changes      08/02/2015 - 12/20/2015 Neo-Adjuvant Chemotherapy    Dose dense Adriamycin and Cytoxan 4 followed by Taxol, switched to Abraxane with cycle 2, completed 12 weekly cycles      12/20/2015 Breast MRI    Radiologic complete response. 2 foci of enhancement 12:00 and 11:30 position previously biopsy-proven benign       CHIEF COMPLIANT: Follow-up to discuss the post neoadjuvant breast MRI  INTERVAL HISTORY: Courtney Gomez is a 50 year old with above-mentioned history of right breast cancer triple negative disease one underwent neoadjuvant chemotherapy and is here today to discuss the final MRI results. She had completed all 12 cycles of adjuvant Abraxane and continues to have some fatigue but denies any neuropathy.  REVIEW OF SYSTEMS:   Constitutional: Denies fevers, chills or abnormal weight loss, complains of fatigue Eyes: Denies blurriness of vision Ears, nose, mouth, throat, and face: Denies mucositis or sore throat Respiratory: Denies cough, dyspnea or wheezes Cardiovascular: Denies palpitation, chest discomfort Gastrointestinal:  Denies nausea, heartburn or change in bowel habits Skin: Denies abnormal skin rashes Lymphatics: Denies new lymphadenopathy or easy  bruising Neurological:Denies numbness, tingling or new weaknesses Behavioral/Psych: Mood is stable, no new changes  Extremities: No lower extremity edema Breast:  denies any pain or lumps or nodules in either breasts All other systems were reviewed with the patient and are negative.  I have reviewed the past medical history, past surgical history, social history and family history with the patient and they are unchanged from previous note.  ALLERGIES:  is allergic to lanolin and latex.  MEDICATIONS:  Current Outpatient Prescriptions  Medication Sig Dispense Refill  . ADVAIR DISKUS 250-50 MCG/DOSE AEPB Inhale 1 puff into the lungs daily.   5  . albuterol (PROAIR HFA) 108 (90 BASE) MCG/ACT inhaler Inhale 2 puffs into the lungs every 6 (six) hours as needed for wheezing or shortness of breath. Reported on 08/23/2015    . cetirizine (ZYRTEC) 10 MG tablet Take 10 mg by mouth daily. Reported on 08/23/2015    . fluticasone (FLONASE) 50 MCG/ACT nasal spray Place 1 spray into both nostrils daily. 16 g 3  . gabapentin (NEURONTIN) 300 MG capsule Take 2 capsules (600 mg total) by mouth 3 (three) times daily. 180 capsule 3  . LORazepam (ATIVAN) 0.5 MG tablet Take 1 tablet (0.5 mg total) by mouth at bedtime as needed. 30 tablet 0  . montelukast (SINGULAIR) 10 MG tablet TAKE 1 TABLET (10 MG TOTAL) BY MOUTH AT BEDTIME. 30 tablet 3   No current facility-administered medications for this visit.     PHYSICAL EXAMINATION: ECOG PERFORMANCE STATUS: 1 - Symptomatic but completely ambulatory  There were no vitals filed for this visit. There were no vitals filed  for this visit.  GENERAL:alert, no distress and comfortable SKIN: skin color, texture, turgor are normal, no rashes or significant lesions EYES: normal, Conjunctiva are pink and non-injected, sclera clear OROPHARYNX:no exudate, no erythema and lips, buccal mucosa, and tongue normal  NECK: supple, thyroid normal size, non-tender, without  nodularity LYMPH:  no palpable lymphadenopathy in the cervical, axillary or inguinal LUNGS: clear to auscultation and percussion with normal breathing effort HEART: regular rate & rhythm and no murmurs and no lower extremity edema ABDOMEN:abdomen soft, non-tender and normal bowel sounds MUSCULOSKELETAL:no cyanosis of digits and no clubbing  NEURO: alert & oriented x 3 with fluent speech, no focal motor/sensory deficits EXTREMITIES: No lower extremity edema  LABORATORY DATA:  I have reviewed the data as listed   Chemistry      Component Value Date/Time   NA 144 12/20/2015 0820   K 4.2 12/20/2015 0820   CO2 28 12/20/2015 0820   BUN 10.7 12/20/2015 0820   CREATININE 0.8 12/20/2015 0820      Component Value Date/Time   CALCIUM 9.6 12/20/2015 0820   ALKPHOS 80 12/20/2015 0820   AST 19 12/20/2015 0820   ALT 29 12/20/2015 0820   BILITOT 0.42 12/20/2015 0820       Lab Results  Component Value Date   WBC 3.3 (L) 12/20/2015   HGB 11.6 12/20/2015   HCT 35.0 12/20/2015   MCV 99.9 12/20/2015   PLT 217 12/20/2015   NEUTROABS 2.1 12/20/2015     ASSESSMENT & PLAN:  Breast cancer of upper-outer quadrant of right female breast ([place]) Right mammogram and ultrasound 07/28/2015 Right breast mass in the axillary tail 1.7 x 1.5 x 1.3 cm axillary ultrasound negative, T1c N0 stage IA clinical stage Right breast biopsy 07/11/2015: 10:00 position: Invasive ductal carcinoma grade 3, ER 0%, PR 0%, Ki-67 95%, HER-2 negative ratio 1.21  Treatment planbased on multidisciplinary tumor board: 1. Neoadj chemotherapy with Adriamycin and Cytoxan dose dense 4 foll by Taxol x 1 then Abraxane weekly total 12 weeks from 08/02/2015 to 12/20/2015 2. Followed by breast conserving surgery with sentinel lymph node study 3. Followed by adjuvant radiation therapy ------------------------------------------------------------------------------------------------------------------------------ Breast MRI: 07/30/2015:  right breast mass 2.4 x 2.1 x 2.1 cm with a preserved fat plane between the mass in the chest wall, 5 mm enhancing mass in the right breast at 12:00 C,biopsy-proven fibroadenoma  Breast MRI post neoadjuvant chemotherapy 12/20/2015 Radiologic complete response. 2 foci of enhancement 12:00 and 11:30 position previously biopsy-proven benign  Radiology review: I discussed the final breast MRI with the patient and reviewed the images. Patient is ecstatic hearing the results. ------------------------------------------------------------------------------------------------------------------------------- Patient will see Dr. Donne Hazel for surgery and she will return back to see me one week post surgery to discuss the final pathology report. I requested Dr. Donne Hazel to leave the port for the time being. If she has significant residual disease she may be eligible for immunotherapy clinical trial.   No orders of the defined types were placed in this encounter.  The patient has a good understanding of the overall plan. she agrees with it. she will call with any problems that may develop before the next visit here.   Rulon Eisenmenger, MD 12/25/15

## 2015-12-25 NOTE — Assessment & Plan Note (Signed)
Right mammogram and ultrasound 07/28/2015 Right breast mass in the axillary tail 1.7 x 1.5 x 1.3 cm axillary ultrasound negative, T1c N0 stage IA clinical stage Right breast biopsy 07/11/2015: 10:00 position: Invasive ductal carcinoma grade 3, ER 0%, PR 0%, Ki-67 95%, HER-2 negative ratio 1.21  Treatment planbased on multidisciplinary tumor board: 1. Neoadj chemotherapy with Adriamycin and Cytoxan dose dense 4 foll by Taxol x 1 then Abraxane weekly total 12 weeks from 08/02/2015 to 12/20/2015 2. Followed by breast conserving surgery with sentinel lymph node study 3. Followed by adjuvant radiation therapy ------------------------------------------------------------------------------------------------------------------------------ Breast MRI: 07/30/2015: right breast mass 2.4 x 2.1 x 2.1 cm with a preserved fat plane between the mass in the chest wall, 5 mm enhancing mass in the right breast at 12:00 C,biopsy-proven fibroadenoma  Breast MRI post neoadjuvant chemotherapy 12/20/2015 Radiologic complete response. 2 foci of enhancement 12:00 and 11:30 position previously biopsy-proven benign  Radiology review: I discussed the final breast MRI with the patient and reviewed the images. Patient is ecstatic hearing the results. ------------------------------------------------------------------------------------------------------------------------------- Patient will see Dr. Wakefield for surgery and she will return back to see me one week post surgery to discuss the final pathology report. I requested Dr. Wakefield to leave the port for the time being. If she has significant residual disease she may be eligible for immunotherapy clinical trial. 

## 2015-12-27 ENCOUNTER — Other Ambulatory Visit: Payer: Self-pay | Admitting: General Surgery

## 2015-12-27 DIAGNOSIS — C50411 Malignant neoplasm of upper-outer quadrant of right female breast: Secondary | ICD-10-CM

## 2015-12-30 ENCOUNTER — Telehealth: Payer: Self-pay | Admitting: Hematology and Oncology

## 2015-12-30 NOTE — Telephone Encounter (Signed)
lvm to inform pt of 10/16 appt per LOS

## 2015-12-31 ENCOUNTER — Encounter (HOSPITAL_BASED_OUTPATIENT_CLINIC_OR_DEPARTMENT_OTHER): Payer: Self-pay | Admitting: *Deleted

## 2016-01-03 ENCOUNTER — Ambulatory Visit
Admission: RE | Admit: 2016-01-03 | Discharge: 2016-01-03 | Disposition: A | Discharge status: Home / Self Care | Payer: 59 | Source: Ambulatory Visit | Attending: General Surgery | Admitting: General Surgery

## 2016-01-03 ENCOUNTER — Other Ambulatory Visit: Payer: Self-pay | Admitting: General Surgery

## 2016-01-03 DIAGNOSIS — C50411 Malignant neoplasm of upper-outer quadrant of right female breast: Secondary | ICD-10-CM

## 2016-01-03 NOTE — Progress Notes (Signed)
Boost drink given with instructions to complete by 0930, pt verbalized understanding.

## 2016-01-06 ENCOUNTER — Ambulatory Visit
Admission: RE | Admit: 2016-01-06 | Discharge: 2016-01-06 | Disposition: A | Discharge status: Home / Self Care | Payer: 59 | Source: Ambulatory Visit | Attending: General Surgery | Admitting: General Surgery

## 2016-01-06 ENCOUNTER — Ambulatory Visit (HOSPITAL_COMMUNITY): Payer: 59

## 2016-01-06 DIAGNOSIS — C50411 Malignant neoplasm of upper-outer quadrant of right female breast: Secondary | ICD-10-CM

## 2016-01-08 ENCOUNTER — Ambulatory Visit (HOSPITAL_BASED_OUTPATIENT_CLINIC_OR_DEPARTMENT_OTHER): Payer: 59 | Admitting: Anesthesiology

## 2016-01-08 ENCOUNTER — Ambulatory Visit (HOSPITAL_BASED_OUTPATIENT_CLINIC_OR_DEPARTMENT_OTHER)
Admission: RE | Admit: 2016-01-08 | Discharge: 2016-01-08 | Disposition: A | Discharge status: Home / Self Care | Payer: 59 | Source: Ambulatory Visit | Attending: General Surgery | Admitting: General Surgery

## 2016-01-08 ENCOUNTER — Ambulatory Visit (HOSPITAL_COMMUNITY)
Admission: RE | Admit: 2016-01-08 | Discharge: 2016-01-08 | Disposition: A | Discharge status: Home / Self Care | Payer: 59 | Source: Ambulatory Visit | Attending: General Surgery | Admitting: General Surgery

## 2016-01-08 ENCOUNTER — Ambulatory Visit (HOSPITAL_COMMUNITY): Payer: 59

## 2016-01-08 ENCOUNTER — Ambulatory Visit
Admission: RE | Admit: 2016-01-08 | Discharge: 2016-01-08 | Disposition: A | Discharge status: Home / Self Care | Payer: 59 | Source: Ambulatory Visit | Attending: General Surgery | Admitting: General Surgery

## 2016-01-08 ENCOUNTER — Encounter (HOSPITAL_BASED_OUTPATIENT_CLINIC_OR_DEPARTMENT_OTHER): Payer: Self-pay | Admitting: Anesthesiology

## 2016-01-08 ENCOUNTER — Encounter (HOSPITAL_BASED_OUTPATIENT_CLINIC_OR_DEPARTMENT_OTHER)
Admission: RE | Disposition: A | Discharge status: Home / Self Care | Payer: Self-pay | Source: Ambulatory Visit | Attending: General Surgery

## 2016-01-08 DIAGNOSIS — Z9221 Personal history of antineoplastic chemotherapy: Secondary | ICD-10-CM | POA: Insufficient documentation

## 2016-01-08 DIAGNOSIS — Z888 Allergy status to other drugs, medicaments and biological substances status: Secondary | ICD-10-CM | POA: Insufficient documentation

## 2016-01-08 DIAGNOSIS — Z833 Family history of diabetes mellitus: Secondary | ICD-10-CM | POA: Diagnosis not present

## 2016-01-08 DIAGNOSIS — Z8249 Family history of ischemic heart disease and other diseases of the circulatory system: Secondary | ICD-10-CM | POA: Insufficient documentation

## 2016-01-08 DIAGNOSIS — Z8042 Family history of malignant neoplasm of prostate: Secondary | ICD-10-CM | POA: Diagnosis not present

## 2016-01-08 DIAGNOSIS — C50411 Malignant neoplasm of upper-outer quadrant of right female breast: Secondary | ICD-10-CM

## 2016-01-08 DIAGNOSIS — Z91040 Latex allergy status: Secondary | ICD-10-CM | POA: Insufficient documentation

## 2016-01-08 DIAGNOSIS — Z8261 Family history of arthritis: Secondary | ICD-10-CM | POA: Insufficient documentation

## 2016-01-08 DIAGNOSIS — N6031 Fibrosclerosis of right breast: Secondary | ICD-10-CM | POA: Insufficient documentation

## 2016-01-08 DIAGNOSIS — J45909 Unspecified asthma, uncomplicated: Secondary | ICD-10-CM | POA: Insufficient documentation

## 2016-01-08 HISTORY — DX: Nausea with vomiting, unspecified: R11.2

## 2016-01-08 HISTORY — DX: Other specified postprocedural states: Z98.890

## 2016-01-08 HISTORY — PX: BREAST LUMPECTOMY: SHX2

## 2016-01-08 HISTORY — PX: RADIOACTIVE SEED GUIDED PARTIAL MASTECTOMY WITH AXILLARY SENTINEL LYMPH NODE BIOPSY: SHX6520

## 2016-01-08 SURGERY — RADIOACTIVE SEED GUIDED PARTIAL MASTECTOMY WITH AXILLARY SENTINEL LYMPH NODE BIOPSY
Anesthesia: General | Site: Breast | Laterality: Right

## 2016-01-08 MED ORDER — PROPOFOL 10 MG/ML IV BOLUS
INTRAVENOUS | Status: AC
  Filled 2016-01-08: qty 20

## 2016-01-08 MED ORDER — GLYCOPYRROLATE 0.2 MG/ML IJ SOLN: 0.2000 mg | Freq: Once | INTRAMUSCULAR | Status: DC | PRN

## 2016-01-08 MED ORDER — BUPIVACAINE HCL (PF) 0.25 % IJ SOLN
INTRAMUSCULAR | Status: DC | PRN
  Administered 2016-01-08: 7 mL

## 2016-01-08 MED ORDER — CEFAZOLIN SODIUM-DEXTROSE 2-4 GM/100ML-% IV SOLN
INTRAVENOUS | Status: AC
  Filled 2016-01-08: qty 100

## 2016-01-08 MED ORDER — CELECOXIB 200 MG PO CAPS
ORAL_CAPSULE | ORAL | Status: AC
  Filled 2016-01-08: qty 1

## 2016-01-08 MED ORDER — LIDOCAINE HCL (CARDIAC) 20 MG/ML IV SOLN
INTRAVENOUS | Status: DC | PRN
  Administered 2016-01-08: 30 mg via INTRAVENOUS

## 2016-01-08 MED ORDER — FENTANYL CITRATE (PF) 100 MCG/2ML IJ SOLN
INTRAMUSCULAR | Status: DC | PRN
  Administered 2016-01-08: 100 ug via INTRAVENOUS

## 2016-01-08 MED ORDER — OXYCODONE HCL 5 MG/5ML PO SOLN: 5.0000 mg | Freq: Once | ORAL | Status: DC | PRN

## 2016-01-08 MED ORDER — SCOPOLAMINE 1 MG/3DAYS TD PT72
MEDICATED_PATCH | TRANSDERMAL | Status: DC | PRN
  Administered 2016-01-08: 1 via TRANSDERMAL

## 2016-01-08 MED ORDER — SODIUM CHLORIDE 0.9 % IJ SOLN
INTRAVENOUS | Status: DC | PRN
  Administered 2016-01-08: 5 mL

## 2016-01-08 MED ORDER — DEXAMETHASONE SODIUM PHOSPHATE 4 MG/ML IJ SOLN
INTRAMUSCULAR | Status: DC | PRN
  Administered 2016-01-08: 10 mg via INTRAVENOUS

## 2016-01-08 MED ORDER — MIDAZOLAM HCL 5 MG/5ML IJ SOLN
INTRAMUSCULAR | Status: DC | PRN
  Administered 2016-01-08: 2 mg via INTRAVENOUS

## 2016-01-08 MED ORDER — SCOPOLAMINE 1 MG/3DAYS TD PT72
MEDICATED_PATCH | TRANSDERMAL | Status: AC
  Filled 2016-01-08: qty 1

## 2016-01-08 MED ORDER — METOCLOPRAMIDE HCL 5 MG/ML IJ SOLN
INTRAMUSCULAR | Status: AC
  Filled 2016-01-08: qty 2

## 2016-01-08 MED ORDER — SODIUM CHLORIDE 0.9 % IJ SOLN
INTRAMUSCULAR | Status: AC
  Filled 2016-01-08: qty 10

## 2016-01-08 MED ORDER — ONDANSETRON HCL 4 MG/2ML IJ SOLN
INTRAMUSCULAR | Status: AC
  Filled 2016-01-08: qty 2

## 2016-01-08 MED ORDER — DEXAMETHASONE SODIUM PHOSPHATE 10 MG/ML IJ SOLN
INTRAMUSCULAR | Status: AC
  Filled 2016-01-08: qty 1

## 2016-01-08 MED ORDER — MEPERIDINE HCL 25 MG/ML IJ SOLN: 6.2500 mg | INTRAMUSCULAR | Status: DC | PRN

## 2016-01-08 MED ORDER — BUPIVACAINE HCL (PF) 0.25 % IJ SOLN
INTRAMUSCULAR | Status: AC
  Filled 2016-01-08: qty 30

## 2016-01-08 MED ORDER — ACETAMINOPHEN 500 MG PO TABS
ORAL_TABLET | ORAL | Status: AC
  Filled 2016-01-08: qty 2

## 2016-01-08 MED ORDER — ARTIFICIAL TEARS OP OINT
TOPICAL_OINTMENT | OPHTHALMIC | Status: AC
  Filled 2016-01-08: qty 3.5

## 2016-01-08 MED ORDER — METHYLENE BLUE 0.5 % INJ SOLN
INTRAVENOUS | Status: AC
  Filled 2016-01-08: qty 10

## 2016-01-08 MED ORDER — GABAPENTIN 300 MG PO CAPS
ORAL_CAPSULE | ORAL | Status: AC
  Filled 2016-01-08: qty 1

## 2016-01-08 MED ORDER — FENTANYL CITRATE (PF) 100 MCG/2ML IJ SOLN
INTRAMUSCULAR | Status: AC
  Filled 2016-01-08: qty 2

## 2016-01-08 MED ORDER — MIDAZOLAM HCL 2 MG/2ML IJ SOLN
INTRAMUSCULAR | Status: AC
  Filled 2016-01-08: qty 2

## 2016-01-08 MED ORDER — CELECOXIB 200 MG PO CAPS
200.0000 mg | ORAL_CAPSULE | ORAL | Status: AC
  Administered 2016-01-08: 200 mg via ORAL

## 2016-01-08 MED ORDER — OXYCODONE HCL 5 MG PO TABS: 5.0000 mg | ORAL_TABLET | Freq: Once | ORAL | Status: DC | PRN

## 2016-01-08 MED ORDER — CEFAZOLIN SODIUM-DEXTROSE 2-4 GM/100ML-% IV SOLN
2.0000 g | INTRAVENOUS | Status: AC
  Administered 2016-01-08: 2 g via INTRAVENOUS

## 2016-01-08 MED ORDER — TECHNETIUM TC 99M SULFUR COLLOID FILTERED
1.0000 | Freq: Once | INTRAVENOUS | Status: AC | PRN
  Administered 2016-01-08: 1 via INTRADERMAL

## 2016-01-08 MED ORDER — FENTANYL CITRATE (PF) 100 MCG/2ML IJ SOLN
50.0000 ug | INTRAMUSCULAR | Status: DC | PRN
  Administered 2016-01-08: 50 ug via INTRAVENOUS
  Administered 2016-01-08: 100 ug via INTRAVENOUS

## 2016-01-08 MED ORDER — PROPOFOL 10 MG/ML IV BOLUS
INTRAVENOUS | Status: DC | PRN
  Administered 2016-01-08: 200 mg via INTRAVENOUS

## 2016-01-08 MED ORDER — MIDAZOLAM HCL 2 MG/2ML IJ SOLN
INTRAMUSCULAR | Status: AC
Start: 2016-01-08 — End: 2016-01-08
  Filled 2016-01-08: qty 2

## 2016-01-08 MED ORDER — MIDAZOLAM HCL 2 MG/2ML IJ SOLN
1.0000 mg | INTRAMUSCULAR | Status: DC | PRN
  Administered 2016-01-08: 2 mg via INTRAVENOUS
  Administered 2016-01-08: 1 mg via INTRAVENOUS

## 2016-01-08 MED ORDER — ACETAMINOPHEN 500 MG PO TABS
1000.0000 mg | ORAL_TABLET | ORAL | Status: AC
  Administered 2016-01-08: 1000 mg via ORAL

## 2016-01-08 MED ORDER — PROMETHAZINE HCL 25 MG/ML IJ SOLN
6.2500 mg | Freq: Four times a day (QID) | INTRAMUSCULAR | Status: AC | PRN
  Administered 2016-01-08: 6.25 mg via INTRAVENOUS

## 2016-01-08 MED ORDER — OXYCODONE HCL 5 MG PO TABS: 5.0000 mg | ORAL_TABLET | Freq: Four times a day (QID) | ORAL | 0 refills | Status: DC | PRN

## 2016-01-08 MED ORDER — ONDANSETRON HCL 4 MG/2ML IJ SOLN
4.0000 mg | Freq: Once | INTRAMUSCULAR | Status: AC | PRN
  Administered 2016-01-08: 4 mg via INTRAVENOUS

## 2016-01-08 MED ORDER — HYDROMORPHONE HCL 1 MG/ML IJ SOLN: 0.2500 mg | INTRAMUSCULAR | Status: DC | PRN

## 2016-01-08 MED ORDER — LACTATED RINGERS IV SOLN
INTRAVENOUS | Status: DC
  Administered 2016-01-08 (×3): via INTRAVENOUS

## 2016-01-08 MED ORDER — GABAPENTIN 300 MG PO CAPS
300.0000 mg | ORAL_CAPSULE | ORAL | Status: AC
  Administered 2016-01-08: 300 mg via ORAL

## 2016-01-08 MED ORDER — ONDANSETRON HCL 4 MG/2ML IJ SOLN
INTRAMUSCULAR | Status: DC | PRN
  Administered 2016-01-08: 4 mg via INTRAVENOUS

## 2016-01-08 MED ORDER — FENTANYL CITRATE (PF) 100 MCG/2ML IJ SOLN
INTRAMUSCULAR | Status: AC
Start: 2016-01-08 — End: 2016-01-08
  Filled 2016-01-08: qty 2

## 2016-01-08 MED ORDER — LIDOCAINE 2% (20 MG/ML) 5 ML SYRINGE
INTRAMUSCULAR | Status: AC
  Filled 2016-01-08: qty 5

## 2016-01-08 MED ORDER — SCOPOLAMINE 1 MG/3DAYS TD PT72: 1.0000 | MEDICATED_PATCH | Freq: Once | TRANSDERMAL | Status: DC | PRN

## 2016-01-08 MED ORDER — PROMETHAZINE HCL 25 MG/ML IJ SOLN
INTRAMUSCULAR | Status: AC
  Filled 2016-01-08: qty 1

## 2016-01-08 SURGICAL SUPPLY — 61 items
ADH SKN CLS APL DERMABOND .7 (GAUZE/BANDAGES/DRESSINGS) ×1
BINDER BREAST LRG (GAUZE/BANDAGES/DRESSINGS) IMPLANT
BINDER BREAST MEDIUM (GAUZE/BANDAGES/DRESSINGS) ×1 IMPLANT
BINDER BREAST XLRG (GAUZE/BANDAGES/DRESSINGS) IMPLANT
BINDER BREAST XXLRG (GAUZE/BANDAGES/DRESSINGS) IMPLANT
BLADE SURG 15 STRL LF DISP TIS (BLADE) ×1 IMPLANT
BLADE SURG 15 STRL SS (BLADE) ×2
CANISTER SUC SOCK COL 7IN (MISCELLANEOUS) IMPLANT
CANISTER SUCT 1200ML W/VALVE (MISCELLANEOUS) ×1 IMPLANT
CHLORAPREP W/TINT 26ML (MISCELLANEOUS) ×2 IMPLANT
CLIP TI WIDE RED SMALL 6 (CLIP) ×2 IMPLANT
COVER BACK TABLE 60X90IN (DRAPES) ×2 IMPLANT
COVER MAYO STAND STRL (DRAPES) ×2 IMPLANT
COVER PROBE W GEL 5X96 (DRAPES) ×2 IMPLANT
DECANTER SPIKE VIAL GLASS SM (MISCELLANEOUS) IMPLANT
DERMABOND ADVANCED (GAUZE/BANDAGES/DRESSINGS) ×1
DERMABOND ADVANCED .7 DNX12 (GAUZE/BANDAGES/DRESSINGS) ×1 IMPLANT
DEVICE DUBIN W/COMP PLATE 8390 (MISCELLANEOUS) ×2 IMPLANT
DRAPE LAPAROSCOPIC ABDOMINAL (DRAPES) ×2 IMPLANT
DRAPE UTILITY XL STRL (DRAPES) ×2 IMPLANT
ELECT COATED BLADE 2.86 ST (ELECTRODE) ×2 IMPLANT
ELECT REM PT RETURN 9FT ADLT (ELECTROSURGICAL) ×2
ELECTRODE REM PT RTRN 9FT ADLT (ELECTROSURGICAL) ×1 IMPLANT
GLOVE BIO SURGEON STRL SZ7 (GLOVE) ×2 IMPLANT
GLOVE BIOGEL PI IND STRL 7.5 (GLOVE) ×1 IMPLANT
GLOVE BIOGEL PI INDICATOR 7.5 (GLOVE) ×1
GLOVE EXAM NITRILE EXT CUFF MD (GLOVE) ×1 IMPLANT
GLOVE SURG SS PI 6.5 STRL IVOR (GLOVE) ×1 IMPLANT
GLOVE SURG SS PI 7.0 STRL IVOR (GLOVE) ×2 IMPLANT
GOWN STRL REUS W/ TWL LRG LVL3 (GOWN DISPOSABLE) ×2 IMPLANT
GOWN STRL REUS W/TWL LRG LVL3 (GOWN DISPOSABLE) ×4
HEMOSTAT ARISTA ABSORB 3G PWDR (MISCELLANEOUS) ×1 IMPLANT
ILLUMINATOR WAVEGUIDE N/F (MISCELLANEOUS) ×1 IMPLANT
KIT MARKER MARGIN INK (KITS) ×2 IMPLANT
LIGHT WAVEGUIDE WIDE FLAT (MISCELLANEOUS) IMPLANT
NDL HYPO 25X1 1.5 SAFETY (NEEDLE) ×1 IMPLANT
NDL SAFETY ECLIPSE 18X1.5 (NEEDLE) IMPLANT
NEEDLE HYPO 18GX1.5 SHARP (NEEDLE)
NEEDLE HYPO 25X1 1.5 SAFETY (NEEDLE) ×4 IMPLANT
NS IRRIG 1000ML POUR BTL (IV SOLUTION) ×1 IMPLANT
PACK BASIN DAY SURGERY FS (CUSTOM PROCEDURE TRAY) ×2 IMPLANT
PENCIL BUTTON HOLSTER BLD 10FT (ELECTRODE) ×2 IMPLANT
SHEET MEDIUM DRAPE 40X70 STRL (DRAPES) IMPLANT
SLEEVE SCD COMPRESS KNEE MED (MISCELLANEOUS) ×2 IMPLANT
SPONGE LAP 4X18 X RAY DECT (DISPOSABLE) ×2 IMPLANT
STOCKINETTE IMPERVIOUS LG (DRAPES) IMPLANT
STRIP CLOSURE SKIN 1/2X4 (GAUZE/BANDAGES/DRESSINGS) ×2 IMPLANT
SUT ETHILON 2 0 FS 18 (SUTURE) IMPLANT
SUT MNCRL AB 4-0 PS2 18 (SUTURE) ×2 IMPLANT
SUT MON AB 5-0 PS2 18 (SUTURE) IMPLANT
SUT SILK 2 0 SH (SUTURE) ×1 IMPLANT
SUT VIC AB 2-0 SH 27 (SUTURE) ×2
SUT VIC AB 2-0 SH 27XBRD (SUTURE) ×1 IMPLANT
SUT VIC AB 3-0 SH 27 (SUTURE) ×2
SUT VIC AB 3-0 SH 27X BRD (SUTURE) ×1 IMPLANT
SUT VIC AB 5-0 PS2 18 (SUTURE) IMPLANT
SYR CONTROL 10ML LL (SYRINGE) ×3 IMPLANT
TOWEL OR 17X24 6PK STRL BLUE (TOWEL DISPOSABLE) ×2 IMPLANT
TOWEL OR NON WOVEN STRL DISP B (DISPOSABLE) ×1 IMPLANT
TUBE CONNECTING 20X1/4 (TUBING) ×1 IMPLANT
YANKAUER SUCT BULB TIP NO VENT (SUCTIONS) ×1 IMPLANT

## 2016-01-08 NOTE — Progress Notes (Signed)
Assisted Dr. Crews with right, ultrasound guided, pectoralis block. Side rails up, monitors on throughout procedure. See vital signs in flow sheet. Tolerated Procedure well. 

## 2016-01-08 NOTE — Discharge Instructions (Signed)
[facility] °Office Phone Number [phone] ° °POST OP INSTRUCTIONS ° °Always review your discharge instruction sheet given to you by the facility where your surgery was performed. ° °IF YOU HAVE DISABILITY OR FAMILY LEAVE FORMS, YOU MUST BRING THEM TO THE OFFICE FOR PROCESSING.  DO NOT GIVE THEM TO YOUR DOCTOR. ° °1. A prescription for pain medication may be given to you upon discharge.  Take your pain medication as prescribed, if needed.  If narcotic pain medicine is not needed, then you may take acetaminophen (Tylenol), naprosyn (Alleve) or ibuprofen (Advil) as needed. °2. Take your usually prescribed medications unless otherwise directed °3. If you need a refill on your pain medication, please contact your pharmacy.  They will contact our office to request authorization.  Prescriptions will not be filled after 5pm or on week-ends. °4. You should eat very light the first 24 hours after surgery, such as soup, crackers, pudding, etc.  Resume your normal diet the day after surgery. °5. Most patients will experience some swelling and bruising in the breast.  Ice packs and a good support bra will help.  Wear the breast binder provided or a sports bra for 72 hours day and night.  After that wear a sports bra during the day until you return to the office. Swelling and bruising can take several days to resolve.  °6. It is common to experience some constipation if taking pain medication after surgery.  Increasing fluid intake and taking a stool softener will usually help or prevent this problem from occurring.  A mild laxative (Milk of Magnesia or Miralax) should be taken according to package directions if there are no bowel movements after 48 hours. °7. Unless discharge instructions indicate otherwise, you may remove your bandages 48 hours after surgery and you may shower at that time.  You may have steri-strips (small skin tapes) in place directly over the incision.  These strips should be left on the  skin for 7-10 days and will come off on their own.  If your surgeon used skin glue on the incision, you may shower in 24 hours.  The glue will flake off over the next 2-3 weeks.  Any sutures or staples will be removed at the office during your follow-up visit. °8. ACTIVITIES:  You may resume regular daily activities (gradually increasing) beginning the next day.  Wearing a good support bra or sports bra minimizes pain and swelling.  You may have sexual intercourse when it is comfortable. °a. You may drive when you no longer are taking prescription pain medication, you can comfortably wear a seatbelt, and you can safely maneuver your car and apply brakes. °b. RETURN TO WORK:  ______________________________________________________________________________________ °9. You should see your doctor in the office for a follow-up appointment approximately two weeks after your surgery.  Your doctor’s nurse will typically make your follow-up appointment when she calls you with your pathology report.  Expect your pathology report 3-4 business days after your surgery.  You may call to check if you do not hear from us after three days. °10. OTHER INSTRUCTIONS: _______________________________________________________________________________________________ _____________________________________________________________________________________________________________________________________ °_____________________________________________________________________________________________________________________________________ °_____________________________________________________________________________________________________________________________________ ° °WHEN TO CALL DR WAKEFIELD: °1. Fever over 101.0 °2. Nausea and/or vomiting. °3. Extreme swelling or bruising. °4. Continued bleeding from incision. °5. Increased pain, redness, or drainage from the incision. ° °The clinic staff is available to answer your questions during regular  business hours.  Please don’t hesitate to call and ask to speak to one of the nurses for clinical concerns.  If   you have a medical emergency, go to the nearest emergency room or call 911.  A surgeon from [facility] is always on call at the hospital. ° °For further questions, please visit [email or web address] ° ° ° °Post Anesthesia Home Care Instructions ° °Activity: °Get plenty of rest for the remainder of the day. A responsible adult should stay with you for 24 hours following the procedure.  °For the next 24 hours, DO NOT: °-Drive a car °-Operate machinery °-Drink alcoholic beverages °-Take any medication unless instructed by your physician °-Make any legal decisions or sign important papers. ° °Meals: °Start with liquid foods such as gelatin or soup. Progress to regular foods as tolerated. Avoid greasy, spicy, heavy foods. If nausea and/or vomiting occur, drink only clear liquids until the nausea and/or vomiting subsides. Call your physician if vomiting continues. ° °Special Instructions/Symptoms: °Your throat may feel dry or sore from the anesthesia or the breathing tube placed in your throat during surgery. If this causes discomfort, gargle with warm salt water. The discomfort should disappear within 24 hours. ° °If you had a scopolamine patch placed behind your ear for the management of post- operative nausea and/or vomiting: ° °1. The medication in the patch is effective for 72 hours, after which it should be removed.  Wrap patch in a tissue and discard in the trash. Wash hands thoroughly with soap and water. °2. You may remove the patch earlier than 72 hours if you experience unpleasant side effects which may include dry mouth, dizziness or visual disturbances. °3. Avoid touching the patch. Wash your hands with soap and water after contact with the patch. °  ° °

## 2016-01-08 NOTE — Anesthesia Preprocedure Evaluation (Signed)
Anesthesia Evaluation  Patient identified by MRN, date of birth, ID band Patient awake    Reviewed: Allergy & Precautions, NPO status , Patient's Chart, lab work & pertinent test results  History of Anesthesia Complications (+) PONV  Airway Mallampati: I  TM Distance: >3 FB Neck ROM: Full    Dental  (+) Teeth Intact, Dental Advisory Given   Pulmonary asthma ,    breath sounds clear to auscultation       Cardiovascular  Rhythm:Regular Rate:Normal     Neuro/Psych    GI/Hepatic   Endo/Other    Renal/GU      Musculoskeletal   Abdominal   Peds  Hematology   Anesthesia Other Findings   Reproductive/Obstetrics                             Anesthesia Physical Anesthesia Plan  ASA: II  Anesthesia Plan: General   Post-op Pain Management:    Induction: Intravenous  Airway Management Planned: LMA  Additional Equipment:   Intra-op Plan:   Post-operative Plan: Extubation in OR  Informed Consent: I have reviewed the patients History and Physical, chart, labs and discussed the procedure including the risks, benefits and alternatives for the proposed anesthesia with the patient or authorized representative who has indicated his/her understanding and acceptance.   Dental advisory given  Plan Discussed with: CRNA, Anesthesiologist and Surgeon  Anesthesia Plan Comments:         Anesthesia Quick Evaluation

## 2016-01-08 NOTE — Anesthesia Procedure Notes (Addendum)
Anesthesia Regional Block:  Pectoralis block  Pre-Anesthetic Checklist: ,, timeout performed, Correct Patient, Correct Site, Correct Laterality, Correct Procedure, Correct Position, site marked, Risks and benefits discussed,  Surgical consent,  Pre-op evaluation,  At surgeon's request and post-op pain management  Laterality: Right and Upper  Prep: chloraprep       Needles:  Injection technique: Single-shot  Needle Type: Echogenic Needle     Needle Length: 9cm 9 cm Needle Gauge: 21 and 21 G    Additional Needles:  Procedures: ultrasound guided (picture in chart) Pectoralis block Narrative:  Start time: 01/08/2016 9:10 AM End time: 01/08/2016 9:15 AM Injection made incrementally with aspirations every 5 mL.  Performed by: Personally  Anesthesiologist: Lynix Bonine      Right PEC block image

## 2016-01-08 NOTE — H&P (Signed)
50 yof who was diagnosed with right breast cancer that is tn grade III with Ki of 95%. she has undergone primary chemotherapy. her mri shows nl left breast, nl lymph nodes and a right breast with no measurable enhancement left. she has tolerated chemotherapy well. she returns today to discuss surgery. she completed chemo on 9/22   Other Problems Rolm Bookbinder, MD; 12/29/2015 6:17 PM) Lump In Breast Asthma  Past Surgical History Rolm Bookbinder, MD; 12/29/2015 6:17 PM) Oral Surgery  Allergies Davy Pique Bynum, CMA; 12/27/2015 3:53 PM) Latex Exam Gloves *MEDICAL DEVICES AND SUPPLIES* Itching, Swelling. Lanolin *PHARMACEUTICAL ADJUVANTS* Itching.  Medication History Marjean Donna, CMA; 12/27/2015 3:53 PM) Singulair (10MG  Tablet, Oral) Active. ProAir HFA (108 (90 Base)MCG/ACT Aerosol Soln, Inhalation) Active. ZyrTEC Allergy (10MG  Tablet, Oral) Active. Flonase (50MCG/ACT Suspension, Nasal) Active. Flonase Allergy Relief (50MCG/ACT Suspension, Nasal) Active. Advair Diskus (250-50MCG/DOSE Aero Pow Br Act, Inhalation) Active. Gianvi (3-0.02MG  Tablet, Oral) Active. Medications Reconciled  Social History Rolm Bookbinder, MD; 12/29/2015 6:17 PM) No drug use Tobacco use Never smoker. Alcohol use Occasional alcohol use. Caffeine use Carbonated beverages, Coffee.  Family History Rolm Bookbinder, MD; 12/29/2015 6:17 PM) Heart disease in female family member before age 62 Heart disease in female family member before age 70 Prostate Cancer Father. Hypertension Mother. Colon Polyps Father. Arthritis Mother. Heart Disease Family Members In General, Father. Diabetes Mellitus Father.   ROS negative  Vitals (Sonya Bynum CMA; 12/27/2015 3:54 PM) 12/27/2015 3:52 PM Weight: 143 lb Height: 66in Body Surface Area: 1.73 m Body Mass Index: 23.08 kg/m  Temp.: 98.71F(Temporal)  Pulse: 75 (Regular)  BP: 124/72 (Sitting, Left Arm, Standard)   Physical  Exam Rolm Bookbinder MD; 12/29/2015 6:15 PM) General Mental Status-Alert. Orientation-Oriented X3.  Chest and Lung Exam Chest and lung exam reveals -on auscultation, normal breath sounds, no adventitious sounds and normal vocal resonance.  Breast Nipples-No Discharge. Breast Lump-No Palpable Breast Mass.  Cardiovascular Cardiovascular examination reveals -normal heart sounds, regular rate and rhythm with no murmurs.  Lymphatic Head & Neck  General Head & Neck Lymphatics: Bilateral - Description - Normal. Axillary  General Axillary Region: Bilateral - Description - Normal. Note: no [place] adenopathy   Assessment & Plan Rolm Bookbinder MD; 12/29/2015 6:17 PM) BREAST CANCER OF UPPER-OUTER QUADRANT OF RIGHT FEMALE BREAST (C50.411) Story: Right breast seed guided lumpectomy, right axillary sn biopsy with injection blue dye We discussed a sentinel lymph node biopsy as she still does not appear to having lymph node involvement right now. We discussed the performance of that with injection of radioactive tracer and blue dye. We discussed that there is a chance of having a positive node with a sentinel lymph node biopsy and we will await the permanent pathology to make any other first further decisions in terms of her treatment. One of these options might be to return to the operating room to perform an axillary lymph node dissection. We discussed up to a 5% risk lifetime of chronic shoulder pain as well as lymphedema associated with a sentinel lymph node biopsy. We discussed the options for treatment of the breast cancer which included lumpectomy versus a mastectomy. We discussed the performance of the lumpectomy with radioactive seed placement. We discussed a 5-10% chance of a positive margin requiring reexcision in the operating room. We also discussed that she will need radiation therapy if she undergoes lumpectomy. We discussed the mastectomy (removal of whole breast) and the  postoperative care for that as well. Mastectomy can be followed by reconstruction. The decision for  lumpectomy vs mastectomy has no impact on decision for chemotherapy. Most mastectomy patients will not need radiation therapy. We discussed that there is no difference in her survival whether she undergoes lumpectomy with radiation therapy or antiestrogen therapy versus a mastectomy. There is also no real difference between her recurrence in the breast. We discussed the risks of operation including bleeding, infection, possible reoperation. She understands her further therapy will be based on what her stages at the time of her operation.

## 2016-01-08 NOTE — Interval H&P Note (Signed)
History and Physical Interval Note:  01/08/2016 9:48 AM  [name]  has presented today for surgery, with the diagnosis of RIGHT BREAST CENTER  The various methods of treatment have been discussed with the patient and family. After consideration of risks, benefits and other options for treatment, the patient has consented to  Procedure(s): RADIOACTIVE SEED LUMPECTOMY  WITH AXILLARY SENTINEL LYMPH NODE BIOPSY AND BLUE DYE INJECTION (Right) as a surgical intervention .  The patient's history has been reviewed, patient examined, no change in status, stable for surgery.  I have reviewed the patient's chart and labs.  Questions were answered to the patient's satisfaction.     Courtney Gomez

## 2016-01-08 NOTE — Op Note (Signed)
Preoperative diagnosis: Right breast cancer s/p primary chemotherapy Postoperative diagnosis: same as above Procedure: 1. Right breast seed guided lumpectomy  2. Rightaxillary sentinel nodebiopsy 3. Injection blue dye for sentinel node identification Surgeon Dr Serita Grammes Anes general with pectoral block EBL: minimal Comps none Specimen:  1. Right breast marked with paint 2. Rightaxillary nodes with highest count of [id number], one node blue 3. Additional anteriormarginmarked short superior, long lateral, double deep Sponge count correct at completion dispo to recovery stable  Indications: This is a 59 yof with tnbc who has undergone primary chemotherapy. She has complete radiologic response.  Nodes have been clinically and radiologically negative.  We discussed options and decided to proceed with left lumpectomy and sn biopsy.    Procedure: After informed consent was obtained the patient was taken to the OR. She was injected with technetium in the standard periareolar fashion. She underwent a pectoral block. She was given anitibiotics. SCDs were in place. She was prepped and draped in the standard sterile surgical fashion. A timeout was performed. She was first injected in periareolar fashion with blue dye and this was massaged for 2 minutes I then located the see in the uoq.  I made an axillary incision and did the entire procedure through this incision.  I used the lighted retractor system to tunnel to the lesion. I then removed the seed with an attempt to get a clear margin. This was passed off the table after marking with paint. The seed was very anterior and I removed this separately.  I did remove additional anterior margin and marked this as above. The anterior margin is the skin and the posterior margin is the muscle.  I did confirm removal of theclipand seedwith mammography.I placed clips in the cavity. I went through the axillary fascia. I identified the sentinel  nodes with the highest count as above. There was no background radioactivity. I obtained hemostasis. I then closed the fascia with 2-0 vicryl. I placed arista in the entire cavity. I closed the breast tissue with 2-0 vicryl. I closed the skin with 3-0 vicryl and 4-0 monocryl. Glue and steristrips were placed A breast binder was placed. She was extubated and transferred to recovery stable

## 2016-01-08 NOTE — Transfer of Care (Signed)
Immediate Anesthesia Transfer of Care Note  Patient: Courtney Gomez  Procedure(s) Performed: Procedure(s): RADIOACTIVE SEED LUMPECTOMY  WITH AXILLARY SENTINEL LYMPH NODE BIOPSY AND BLUE DYE INJECTION (Right)  Patient Location: PACU  Anesthesia Type:GA combined with regional for post-op pain  Level of Consciousness: sedated  Airway & Oxygen Therapy: Patient Spontanous Breathing and Patient connected to face mask oxygen  Post-op Assessment: Report given to RN and Post -op Vital signs reviewed and stable  Post vital signs: Reviewed and stable  Last Vitals:  Vitals:   01/08/16 0924 01/08/16 1101  BP:  (!) 91/47  Pulse: 90 75  Resp: 18 (P) 16  Temp:      Last Pain:  Vitals:   01/08/16 0819  TempSrc: Oral  PainSc: 0-No pain      Patients Stated Pain Goal: 0 ([id number])  Complications: No apparent anesthesia complications

## 2016-01-08 NOTE — Anesthesia Procedure Notes (Signed)
Procedure Name: LMA Insertion Date/Time: 01/08/2016 10:00 AM Performed by: Toula Moos L Pre-anesthesia Checklist: Patient identified, Emergency Drugs available, Suction available, Patient being monitored and Timeout performed Patient Re-evaluated:Patient Re-evaluated prior to inductionOxygen Delivery Method: Circle system utilized Preoxygenation: Pre-oxygenation with 100% oxygen Intubation Type: IV induction Ventilation: Mask ventilation without difficulty LMA: LMA inserted LMA Size: 4.0 Number of attempts: 1 Airway Equipment and Method: Bite block Placement Confirmation: positive ETCO2 Tube secured with: Tape Dental Injury: Teeth and Oropharynx as per pre-operative assessment

## 2016-01-08 NOTE — Progress Notes (Signed)
Emotional support during breast injections °

## 2016-01-08 NOTE — Anesthesia Postprocedure Evaluation (Signed)
Anesthesia Post Note  Patient: SAMAN YAZEL  Procedure(s) Performed: Procedure(s) (LRB): RADIOACTIVE SEED LUMPECTOMY  WITH AXILLARY SENTINEL LYMPH NODE BIOPSY AND BLUE DYE INJECTION (Right)  Patient location during evaluation: PACU Anesthesia Type: General Level of consciousness: awake and alert Pain management: pain level controlled Vital Signs Assessment: post-procedure vital signs reviewed and stable Respiratory status: spontaneous breathing, nonlabored ventilation and respiratory function stable Cardiovascular status: blood pressure returned to baseline and stable Postop Assessment: no signs of nausea or vomiting Anesthetic complications: no    Last Vitals:  Vitals:   01/08/16 1130 01/08/16 1145  BP: 117/79 120/75  Pulse: 89 77  Resp: (!) 21 11  Temp:      Last Pain:  Vitals:   01/08/16 1145  TempSrc:   PainSc: 3                  Chadley Dziedzic A

## 2016-01-09 ENCOUNTER — Encounter (HOSPITAL_BASED_OUTPATIENT_CLINIC_OR_DEPARTMENT_OTHER): Payer: Self-pay | Admitting: General Surgery

## 2016-01-13 ENCOUNTER — Ambulatory Visit: Payer: 59 | Admitting: Hematology and Oncology

## 2016-01-13 NOTE — Assessment & Plan Note (Deleted)
Right mammogram and ultrasound 07/28/2015 Right breast mass in the axillary tail 1.7 x 1.5 x 1.3 cm axillary ultrasound negative, T1c N0 stage IA clinical stage Right breast biopsy 07/11/2015: 10:00 position: Invasive ductal carcinoma grade 3, ER 0%, PR 0%, Ki-67 95%, HER-2 negative ratio 1.21  Treatment planbased on multidisciplinary tumor board: 1. Neoadj chemotherapy with Adriamycin and Cytoxan dose dense 4 foll by Taxol x 1 then Abraxane weekly total 12 weeks from 08/02/2015 to 12/20/2015 2. Followed by breast conserving surgery with sentinel lymph node study 3. Followed by adjuvant radiation therapy ------------------------------------------------------------------------------------------------------------------------------ Breast MRI: 07/30/2015: right breast mass 2.4 x 2.1 x 2.1 cm with a preserved fat plane between the mass in the chest wall, 5 mm enhancing mass in the right breast at 12:00 C,biopsy-proven fibroadenoma  Breast MRI post neoadjuvant chemotherapy 12/20/2015 Radiologic complete response. 2 foci of enhancement 12:00 and 11:30 position previously biopsy-proven benign  Right lumpectomy 01/08/2016:  Pathology counseling: I discussed the final pathology report of the patient provided  a copy of this report. I discussed the margins as well as lymph node surgeries. We also discussed the final staging along with previously performed ER/PR and HER-2/neu testing.

## 2016-01-14 ENCOUNTER — Other Ambulatory Visit: Payer: Self-pay | Admitting: *Deleted

## 2016-01-14 DIAGNOSIS — Z171 Estrogen receptor negative status [ER-]: Secondary | ICD-10-CM

## 2016-01-14 DIAGNOSIS — C50411 Malignant neoplasm of upper-outer quadrant of right female breast: Secondary | ICD-10-CM

## 2016-01-14 MED ORDER — OXYCODONE HCL 5 MG PO TABS: 5.0000 mg | ORAL_TABLET | Freq: Four times a day (QID) | ORAL | 0 refills | Status: DC | PRN

## 2016-01-14 MED ORDER — OXYCODONE HCL 5 MG PO TABS
5.0000 mg | ORAL_TABLET | Freq: Four times a day (QID) | ORAL | 0 refills | Status: DC | PRN
Start: 2016-01-14 — End: 2016-01-14

## 2016-01-15 ENCOUNTER — Encounter: Payer: Self-pay | Admitting: Hematology and Oncology

## 2016-01-15 ENCOUNTER — Ambulatory Visit (HOSPITAL_BASED_OUTPATIENT_CLINIC_OR_DEPARTMENT_OTHER): Payer: 59 | Admitting: Hematology and Oncology

## 2016-01-15 DIAGNOSIS — C50411 Malignant neoplasm of upper-outer quadrant of right female breast: Secondary | ICD-10-CM

## 2016-01-15 DIAGNOSIS — Z171 Estrogen receptor negative status [ER-]: Secondary | ICD-10-CM

## 2016-01-15 NOTE — Progress Notes (Signed)
Patient Care Team: Vania Rea, MD as PCP - General (Obstetrics and Gynecology)  DIAGNOSIS:  Encounter Diagnosis  Name Primary?  . Malignant neoplasm of upper-outer quadrant of right breast in female, estrogen receptor negative ([place])     SUMMARY OF ONCOLOGIC HISTORY:   Breast cancer of upper-outer quadrant of right female breast ([place])   07/11/2015 Mammogram    Right breast mass in the axillary tail 1.7 x 1.5 x 1.3 cm axillary ultrasound negative, T1c N0 stage IA clinical stage      07/11/2015 Initial Diagnosis    Right breast biopsy 10:00 position: Invasive ductal carcinoma grade 3, ER 0%, P of 0%, Ki-67 95%, HER-2 negative ratio 1.21; right breast biopsy 12:00: Fibrocystic changes      08/02/2015 - 12/20/2015 Neo-Adjuvant Chemotherapy    Dose dense Adriamycin and Cytoxan 4 followed by Taxol, switched to Abraxane with cycle 2, completed 12 weekly cycles      12/20/2015 Breast MRI    Radiologic complete response. 2 foci of enhancement 12:00 and 11:30 position previously biopsy-proven benign      01/08/2016 Surgery    Right lumpectomy: Focal ALH, fibrocystic change, no residual cancer, 0/7 lymph nodes negative, triple negative disease, complete pathologic response       CHIEF COMPLIANT: F/U after lumpectomy  INTERVAL HISTORY: Courtney Gomez is a 50 yr old with above H/O lumpectomy and is here for follow up. She has discomfort under the arms but doing quite well.  REVIEW OF SYSTEMS:   Constitutional: Denies fevers, chills or abnormal weight loss Eyes: Denies blurriness of vision Ears, nose, mouth, throat, and face: Denies mucositis or sore throat Respiratory: Denies cough, dyspnea or wheezes Cardiovascular: Denies palpitation, chest discomfort Gastrointestinal:  Denies nausea, heartburn or change in bowel habits Skin: Denies abnormal skin rashes Lymphatics: Denies new lymphadenopathy or easy bruising Neurological:Denies numbness, tingling or new  weaknesses Behavioral/Psych: Mood is stable, no new changes  Extremities: No lower extremity edema Breast:Recent lumpectomy All other systems were reviewed with the patient and are negative.  I have reviewed the past medical history, past surgical history, social history and family history with the patient and they are unchanged from previous note.  ALLERGIES:  is allergic to lanolin and latex.  MEDICATIONS:  Current Outpatient Prescriptions  Medication Sig Dispense Refill  . ADVAIR DISKUS 250-50 MCG/DOSE AEPB Inhale 1 puff into the lungs daily.   5  . albuterol (PROAIR HFA) 108 (90 BASE) MCG/ACT inhaler Inhale 2 puffs into the lungs every 6 (six) hours as needed for wheezing or shortness of breath. Reported on 08/23/2015    . cetirizine (ZYRTEC) 10 MG tablet Take 10 mg by mouth daily. Reported on 08/23/2015    . fluticasone (FLONASE) 50 MCG/ACT nasal spray Place 1 spray into both nostrils daily. 16 g 3  . LORazepam (ATIVAN) 0.5 MG tablet Take 1 tablet (0.5 mg total) by mouth at bedtime as needed. 30 tablet 0  . montelukast (SINGULAIR) 10 MG tablet TAKE 1 TABLET (10 MG TOTAL) BY MOUTH AT BEDTIME. 30 tablet 3  . oxyCODONE (OXY IR/ROXICODONE) 5 MG immediate release tablet Take 1 tablet (5 mg total) by mouth every 6 (six) hours as needed for moderate pain, severe pain or breakthrough pain. 20 tablet 0   No current facility-administered medications for this visit.     PHYSICAL EXAMINATION: ECOG PERFORMANCE STATUS: 1  Vitals:   01/15/16 1120  BP: (!) 147/88  Pulse: 84  Resp: 17  Temp: 97.1 F (36.2 C)  Filed Weights   01/15/16 1120  Weight: 142 lb 8 oz (64.6 kg)    GENERAL:alert, no distress and comfortable SKIN: skin color, texture, turgor are normal, no rashes or significant lesions EYES: normal, Conjunctiva are pink and non-injected, sclera clear OROPHARYNX:no exudate, no erythema and lips, buccal mucosa, and tongue normal  NECK: supple, thyroid normal size, non-tender,  without nodularity LYMPH:  no palpable lymphadenopathy in the cervical, axillary or inguinal LUNGS: clear to auscultation and percussion with normal breathing effort HEART: regular rate & rhythm and no murmurs and no lower extremity edema ABDOMEN:abdomen soft, non-tender and normal bowel sounds MUSCULOSKELETAL:no cyanosis of digits and no clubbing  NEURO: alert & oriented x 3 with fluent speech, no focal motor/sensory deficits EXTREMITIES: No lower extremity edema  LABORATORY DATA:  I have reviewed the data as listed   Chemistry      Component Value Date/Time   NA 144 12/20/2015 0820   K 4.2 12/20/2015 0820   CO2 28 12/20/2015 0820   BUN 10.7 12/20/2015 0820   CREATININE 0.8 12/20/2015 0820      Component Value Date/Time   CALCIUM 9.6 12/20/2015 0820   ALKPHOS 80 12/20/2015 0820   AST 19 12/20/2015 0820   ALT 29 12/20/2015 0820   BILITOT 0.42 12/20/2015 0820       Lab Results  Component Value Date   WBC 3.3 (L) 12/20/2015   HGB 11.6 12/20/2015   HCT 35.0 12/20/2015   MCV 99.9 12/20/2015   PLT 217 12/20/2015   NEUTROABS 2.1 12/20/2015     ASSESSMENT & PLAN:  Breast cancer of upper-outer quadrant of right female breast ([place]) Right mammogram and ultrasound 07/28/2015 Right breast mass in the axillary tail 1.7 x 1.5 x 1.3 cm axillary ultrasound negative, T1c N0 stage IA clinical stage Right breast biopsy 07/11/2015: 10:00 position: Invasive ductal carcinoma grade 3, ER 0%, PR 0%, Ki-67 95%, HER-2 negative ratio 1.21  Treatment planbased on multidisciplinary tumor board: 1. Neoadj chemotherapy with Adriamycin and Cytoxan dose dense 4 foll by Taxol x 1 then Abraxane weekly total 12 weeks from 08/02/2015 to 12/20/2015 2. Followed by breast conserving surgery with sentinel lymph node study 3. Followed by adjuvant radiation therapy ------------------------------------------------------------------------------------------------------------------------------ Breast MRI:  07/30/2015: right breast mass 2.4 x 2.1 x 2.1 cm with a preserved fat plane between the mass in the chest wall, 5 mm enhancing mass in the right breast at 12:00 C,biopsy-proven fibroadenoma  Breast MRI post neoadjuvant chemotherapy 12/20/2015 Radiologic complete response. 2 foci of enhancement 12:00 and 11:30 position previously biopsy-proven benign  Right lumpectomy 01/08/2016: Right lumpectomy: Focal ALH, fibrocystic change, no residual cancer, 0/7 lymph nodes negative, triple negative disease, complete pathologic response  Pathology counseling: I discussed the final pathology report of the patient provided  a copy of this report. I discussed the margins as well as lymph node surgeries. We also discussed the final staging along with previously performed ER/PR and HER-2/neu testing.  Recommendation: 1. Adjuvant radiation therapy 2. followed by surveillance  Return to clinic in 6 months for surveillance and follow-up     Orders Placed This Encounter  Procedures  . Ambulatory referral to Physical Therapy    Referral Priority:   Routine    Referral Type:   Physical Medicine    Referral Reason:   Specialty Services Required    Requested Specialty:   Physical Therapy    Number of Visits Requested:   1   The patient has a good understanding of the overall plan.  she agrees with it. she will call with any problems that may develop before the next visit here.   Rulon Eisenmenger, MD 01/15/16

## 2016-01-15 NOTE — Assessment & Plan Note (Signed)
Right mammogram and ultrasound 07/28/2015 Right breast mass in the axillary tail 1.7 x 1.5 x 1.3 cm axillary ultrasound negative, T1c N0 stage IA clinical stage Right breast biopsy 07/11/2015: 10:00 position: Invasive ductal carcinoma grade 3, ER 0%, PR 0%, Ki-67 95%, HER-2 negative ratio 1.21  Treatment planbased on multidisciplinary tumor board: 1. Neoadj chemotherapy with Adriamycin and Cytoxan dose dense 4 foll by Taxol x 1 then Abraxane weekly total 12 weeks from 08/02/2015 to 12/20/2015 2. Followed by breast conserving surgery with sentinel lymph node study 3. Followed by adjuvant radiation therapy ------------------------------------------------------------------------------------------------------------------------------ Breast MRI: 07/30/2015: right breast mass 2.4 x 2.1 x 2.1 cm with a preserved fat plane between the mass in the chest wall, 5 mm enhancing mass in the right breast at 12:00 C,biopsy-proven fibroadenoma  Breast MRI post neoadjuvant chemotherapy 12/20/2015 Radiologic complete response. 2 foci of enhancement 12:00 and 11:30 position previously biopsy-proven benign  Right lumpectomy 01/08/2016: Right lumpectomy: Focal ALH, fibrocystic change, no residual cancer, 0/7 lymph nodes negative, triple negative disease, complete pathologic response  Pathology counseling: I discussed the final pathology report of the patient provided  a copy of this report. I discussed the margins as well as lymph node surgeries. We also discussed the final staging along with previously performed ER/PR and HER-2/neu testing.  Recommendation: 1. Adjuvant radiation therapy 2. followed by surveillance  Return to clinic in 6 months for surveillance and follow-up

## 2016-01-16 ENCOUNTER — Encounter: Payer: Self-pay | Admitting: *Deleted

## 2016-01-21 ENCOUNTER — Encounter: Payer: Self-pay | Admitting: Physical Therapy

## 2016-01-21 ENCOUNTER — Ambulatory Visit: Payer: 59 | Attending: Hematology and Oncology | Admitting: Physical Therapy

## 2016-01-21 DIAGNOSIS — M25511 Pain in right shoulder: Secondary | ICD-10-CM | POA: Diagnosis present

## 2016-01-21 DIAGNOSIS — R293 Abnormal posture: Secondary | ICD-10-CM | POA: Insufficient documentation

## 2016-01-21 DIAGNOSIS — M25611 Stiffness of right shoulder, not elsewhere classified: Secondary | ICD-10-CM | POA: Diagnosis not present

## 2016-01-21 NOTE — Therapy (Signed)
[place], [place], [address] Phone: [phone]   Fax:  [phone]  Physical Therapy Evaluation  Patient Details  Name: Courtney Gomez MRN: [id number] Date of Birth: April 22, 1965 Referring Provider: Lindi Adie  Encounter Date: 01/21/2016      PT End of Session - 01/21/16 1214    Visit Number 1   Number of Visits 9   Date for PT Re-Evaluation 02/18/16   PT Start Time 0853   PT Stop Time 0934   PT Time Calculation (min) 41 min   Activity Tolerance Patient tolerated treatment well   Behavior During Therapy [facility] for tasks assessed/performed      Past Medical History:  Diagnosis Date  . Anxiety    not currently  . Asthma   . Breast cancer of upper-outer quadrant of right female breast ([place]) 07/15/2015  . Eczema   . History of motion sickness   . PONV (postoperative nausea and vomiting)     Past Surgical History:  Procedure Laterality Date  . DENTAL SURGERY  1982   impacted teeth  . PORTACATH PLACEMENT Right 07/31/2015   Procedure: INSERTION PORT-A-CATH WITH ULTRASOUND ;  Surgeon: Rolm Bookbinder, MD;  Location: [place];  Service: General;  Laterality: Right;  . RADIOACTIVE SEED GUIDED MASTECTOMY WITH AXILLARY SENTINEL LYMPH NODE BIOPSY Right 01/08/2016   Procedure: RADIOACTIVE SEED LUMPECTOMY  WITH AXILLARY SENTINEL LYMPH NODE BIOPSY AND BLUE DYE INJECTION;  Surgeon: Rolm Bookbinder, MD;  Location: [place];  Service: General;  Laterality: Right;    There were no vitals filed for this visit.       Subjective Assessment - 01/21/16 0856    Subjective I had a right lumpectomy on 01/08/16 with lymph node removal - they removed 7. 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 I can't lay on my belly. Just sitting I am not in pain but if my skin touches anything it feels funny. I can tell it is healing because it is not hurting as bad. I got one of those sleeves  the other day.    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 It measures 1.7 cm in the upper outer quadrant with a Ki67 of 95%. , Lumpectomy on 01/08/16 with lymph node dissection   Patient Stated Goals to reduce my risk of lymphedema, go back to golfing   Currently in Pain? No/denies   Pain Score 0-No pain            [facility] PT Assessment - 01/21/16 0001      Assessment   Medical Diagnosis right breast cancer   Referring Provider Gudena   Onset Date/Surgical Date 01/08/16  lumpectomy   Hand Dominance Right   Prior Therapy none     Precautions   Precautions Other (comment)  at risk for lymphedema     Restrictions   Weight Bearing Restrictions No     Balance Screen   Has the patient fallen in the past 6 months No   Has the patient had a decrease in activity level because of a fear of falling?  No   Is the patient reluctant to leave their home because of a fear of falling?  No     Home Social worker Private residence   Living Arrangements Children   Available Help at Discharge Family   Type of [place]  Home Access Stairs to enter   Entrance Stairs-Number of Steps 2   Entrance Stairs-Rails None   Home Layout Two level   Alternate Level Stairs-Number of Steps 14   Alternate Level Stairs-Rails Can reach both   Home Equipment None     Prior Function   Level of Independence Independent   Vocation Full time employment   Youth worker - lots of writing, editing, meetings, on computer a lot   Leisure tries to walk 30 min - 1 hr 2-3x/wk     Cognition   Overall Cognitive Status Within Functional Limits for tasks assessed     Observation/Other Assessments   Skin Integrity lumpectomy still healing and currently dressed - pt follows up with doctor tomorrow     Posture/Postural Control   Posture/Postural Control Postural limitations    Postural Limitations Rounded Shoulders     AROM   Right Shoulder Flexion 123 Degrees   Right Shoulder ABduction 94 Degrees   Right Shoulder Internal Rotation 53 Degrees   Right Shoulder External Rotation 90 Degrees   Left Shoulder Flexion 170 Degrees   Left Shoulder ABduction 166 Degrees   Left Shoulder Internal Rotation 63 Degrees   Left Shoulder External Rotation 90 Degrees     Strength   Overall Strength Within functional limits for tasks performed  not tested on R due to pain, L triceps 4/5           LYMPHEDEMA/ONCOLOGY QUESTIONNAIRE - 01/21/16 0920      Surgeries   Lumpectomy Date 01/08/16   Axillary Lymph Node Dissection Date 01/08/16   Number Lymph Nodes Removed 7     Treatment   Active Chemotherapy Treatment No   Past Chemotherapy Treatment Yes   Active Radiation Treatment No  pt to begin   Past Radiation Treatment No     What other symptoms do you have   Are you Having Heaviness or Tightness No   Are you having Pain Yes   Are you having pitting edema No   Is it Hard or Difficult finding clothes that fit No   Do you have infections No   Is there Decreased scar mobility --  unable to assess, still healing     Right Upper Extremity Lymphedema   15 cm Proximal to Olecranon Process 28 cm   10 cm Proximal to Olecranon Process 26 cm   Olecranon Process 24 cm   15 cm Proximal to Ulnar Styloid Process 23.5 cm   10 cm Proximal to Ulnar Styloid Process 20.1 cm   Just Proximal to Ulnar Styloid Process 14.7 cm   Across Hand at [employer] 17.5 cm   At [place] of 2nd Digit 5.8 cm     Left Upper Extremity Lymphedema   15 cm Proximal to Olecranon Process 28.1 cm   10 cm Proximal to Olecranon Process 26.1 cm   Olecranon Process 23.6 cm   15 cm Proximal to Ulnar Styloid Process 22.6 cm   10 cm Proximal to Ulnar Styloid Process 19 cm   Just Proximal to Ulnar Styloid Process 14.7 cm   Across Hand at [employer] 18.2 cm   At [place] of 2nd Digit 5.6 cm                 [facility] Adult PT Treatment/Exercise - 01/21/16 0001      Shoulder Exercises: Supine   Flexion AAROM;Both;5 reps;Other (comment)  dowel   ABduction AAROM;Right;5 reps;Other (comment)  dowel  PT Education - 01/21/16 1213    Education provided Yes   Education Details lymphedema risk reduction, HEP, anatomy and physiology of lymphatic system   Person(s) Educated Patient   Methods Explanation;Handout   Comprehension Verbalized understanding            [facility] Goals - 01/21/16 1221      CC Long Term Goal  #1   Title Pt to demonstrate 165 degrees of right shoulder flexion to allow her to reach items overhead   Baseline 123   Time 4   Period Weeks   Status New     CC Long Term Goal  #2   Title Pt to demonstrate 160 degrees of right shoulder abduction to allow her to reach items out to sides   Baseline 94   Time 4   Period Weeks   Status New     CC Long Term Goal  #3   Title Pt to be able to independently verbalize lymphedema risk reduction practices   Time 4   Period Weeks   Status New     CC Long Term Goal  #4   Title Patient to be independent in a home exercise program for continued strengthening and stretching   Time 4   Period Weeks   Status New            Plan - 01/21/16 1215    Clinical Impression Statement Patient underwent a right lumpectomy on 01/08/16. She has already completed chemotherapy and is going to undergo radiation. Patient demonstrates decreased right shoulder ROM compared to left. She has returned to work and mostly works at [employer]. Her lumpectomy scar is still healing and once healed she may be a candidate for scar mobilization. She would benefit from skilled PT services for ROM and strengthening of R shoulder, scar mobilization pending scar healing and educated about lymphedema risk reduction practices. Patient has already received a sleeve from DME supplier since she will by flying next  month.    PT Frequency 2x / week   PT Duration 4 weeks   PT Treatment/Interventions Therapeutic exercise;Patient/family education;ADLs/Self Care Home Management;Passive range of motion;Manual techniques;Orthotic Fit/Training   PT Next Visit Plan begin PROM/AAROM/AROM to R shoulder   PT Home Exercise Plan supine dowel exercises   Consulted and Agree with Plan of Care Patient      Patient will benefit from skilled therapeutic intervention in order to improve the following deficits and impairments:  Decreased strength, Decreased knowledge of precautions, Pain, Impaired UE functional use, Decreased range of motion, Decreased scar mobility  Visit Diagnosis: Stiffness of right shoulder, not elsewhere classified - Plan: PT plan of care cert/re-cert  Acute pain of right shoulder - Plan: PT plan of care cert/re-cert  Abnormal posture - Plan: PT plan of care cert/re-cert     Problem List Patient Active Problem List   Diagnosis Date Noted  . Hypersensitivity reaction 10/04/2015  . Genetic testing 09/09/2015  . Breast cancer of upper-outer quadrant of right female breast ([place]) 07/15/2015  . Moderate persistent asthma 02/25/2015  . Allergic rhinitis due to pollen 02/25/2015  . HIP PAIN, BILATERAL 07/26/2008  . ECZEMA, ATOPIC DERMATITIS 05/27/2006    Alexia Freestone 01/21/2016, 12:26 PM  [place], [place], [address] Phone: [phone]   Fax:  [phone]  Name: Courtney Gomez MRN: [id number] Date of Birth: [birth year]   Allyson Sabal, PT 01/21/16 12:26 PM

## 2016-01-21 NOTE — Patient Instructions (Signed)
Shoulder: Flexion (Supine)    With hands shoulder width apart, slowly lower dowel to floor behind head. Do not let elbows bend. Keep back flat. Hold _10-30___ seconds. Repeat __10__ times. Do __2__ sessions per day. CAUTION: Stretch slowly and gently.  Copyright  VHI. All rights reserved.  Shoulder: Abduction (Supine)    With right arm flat on floor, hold dowel in palm. Slowly move arm up to side of head by pushing with opposite arm. Do not let elbow bend. Hold _10-30___ seconds. Repeat _10___ times. Do _2___ sessions per day. CAUTION: Stretch slowly and gently.  Copyright  VHI. All rights reserved.   

## 2016-01-23 ENCOUNTER — Ambulatory Visit: Payer: 59 | Admitting: Physical Therapy

## 2016-01-24 ENCOUNTER — Ambulatory Visit: Payer: 59 | Admitting: Physical Therapy

## 2016-01-24 DIAGNOSIS — M25611 Stiffness of right shoulder, not elsewhere classified: Secondary | ICD-10-CM

## 2016-01-24 DIAGNOSIS — M25511 Pain in right shoulder: Secondary | ICD-10-CM

## 2016-01-24 DIAGNOSIS — R293 Abnormal posture: Secondary | ICD-10-CM

## 2016-01-24 NOTE — Patient Instructions (Addendum)
CHEST: Doorway, Bilateral - Standing    Standing in doorway, place hands on wall with elbows bent at shoulder height. Lean forward. Hold __15_ seconds. __2_ reps per set, __2_ sets per day, _7__ days per week  Copyright  VHI. All rights reserved.  CHEST: Doorway, Unilateral - Standing    Standing in doorway, place one hand on wall with elbow bent at shoulder height. Lean forward. Hold _15__ seconds. __2_ reps per set, __2_ sets per day, _7__ days per week  Copyright  VHI. All rights reserved.  Supine With Rotation    Lie, back flat, legs bent, feet together. Rotate knees to one side. Hold __30_ seconds. Repeat to other side. Repeat _1-2__ times per session. Do _2__ sessions per day.  Copyright  VHI. All rights reserved.

## 2016-01-24 NOTE — Therapy (Signed)
[place], [place], [address] Phone: [phone]   Fax:  [phone]  Physical Therapy Treatment  Patient Details  Name: Courtney Gomez MRN: [id number] Date of Birth: Apr 18, 1965 Referring Provider: Lindi Adie  Encounter Date: 01/24/2016      PT End of Session - 01/24/16 0855    Visit Number 2   Number of Visits 9   Date for PT Re-Evaluation 02/18/16   PT Start Time 0805   PT Stop Time 0848   PT Time Calculation (min) 43 min   Activity Tolerance Patient tolerated treatment well   Behavior During Therapy [facility] for tasks assessed/performed      Past Medical History:  Diagnosis Date  . Anxiety    not currently  . Asthma   . Breast cancer of upper-outer quadrant of right female breast ([place]) 07/15/2015  . Eczema   . History of motion sickness   . PONV (postoperative nausea and vomiting)     Past Surgical History:  Procedure Laterality Date  . DENTAL SURGERY  1982   impacted teeth  . PORTACATH PLACEMENT Right 07/31/2015   Procedure: INSERTION PORT-A-CATH WITH ULTRASOUND ;  Surgeon: Rolm Bookbinder, MD;  Location: [place];  Service: General;  Laterality: Right;  . RADIOACTIVE SEED GUIDED MASTECTOMY WITH AXILLARY SENTINEL LYMPH NODE BIOPSY Right 01/08/2016   Procedure: RADIOACTIVE SEED LUMPECTOMY  WITH AXILLARY SENTINEL LYMPH NODE BIOPSY AND BLUE DYE INJECTION;  Surgeon: Rolm Bookbinder, MD;  Location: [place];  Service: General;  Laterality: Right;    There were no vitals filed for this visit.      Subjective Assessment - 01/24/16 0805    Subjective "I've been doing my exercises, but it's tighter."  "I don't hurt but I can tell I've stretched right here (right pec insertion area)."   Currently in Pain? No/denies                         [facility] Adult PT Treatment/Exercise - 01/24/16 0001      Exercises   Exercises Other Exercises   Other Exercises  five  diaphragmatic slow breaths for relaxation     Shoulder Exercises: Supine   Other Supine Exercises over foam roller, relax scapulae back; horizontal abduction x 30 seconds; then active "Y" stretch x 7   Other Supine Exercises hooklying lower trunk rotation to left with right arm in abduction x 30 seconds     Shoulder Exercises: Seated   Other Seated Exercises backward shoulder circles x 5 at end of session with cueing to slow down     Shoulder Exercises: Standing   Other Standing Exercises doorway stretch 15 sec. x2 bilat. and x 1 unilateral on right  cueing to avoid increasing lordosis while stretching     Shoulder Exercises: Pulleys   Flexion 2 minutes   ABduction 2 minutes     Shoulder Exercises: Therapy Ball   Flexion 10 reps   ABduction 5 reps     Manual Therapy   Manual Therapy Myofascial release;Passive ROM   Myofascial Release supine right UE myofascial pulling with small movement into abduction   Passive ROM Briefly today:  passive er, abduction, flexion to pt. tolerance                PT Education - 01/24/16 0855    Education provided Yes   Education Details doorway stretches; hooklying with shoulders in abduction lower trunk rotation for pect stretch  Person(s) Educated Patient   Methods Explanation;Demonstration;Handout;Tactile cues;Verbal cues   Comprehension Verbalized understanding;Returned demonstration              [facility] Goals - 07/24/15 1519      Patient will be able to verbalize understanding of pertinent lymphedema risk reduction practices relevant to her diagnosis specifically related to skin care.   Time 1   Period Days   Status Achieved     Patient will be able to return demonstrate and/or verbalize understanding of the post-op home exercise program related to regaining shoulder range of motion.   Time 1   Period Days   Status Achieved     Patient will be able to verbalize understanding of the importance of attending the  postoperative After Breast Cancer Class for further lymphedema risk reduction education and therapeutic exercise.   Time 1   Period Days   Status Achieved          [facility] Clinic Goals - 01/21/16 1221      CC Long Term Goal  #1   Title Pt to demonstrate 165 degrees of right shoulder flexion to allow her to reach items overhead   Baseline 123   Time 4   Period Weeks   Status New     CC Long Term Goal  #2   Title Pt to demonstrate 160 degrees of right shoulder abduction to allow her to reach items out to sides   Baseline 94   Time 4   Period Weeks   Status New     CC Long Term Goal  #3   Title Pt to be able to independently verbalize lymphedema risk reduction practices   Time 4   Period Weeks   Status New     CC Long Term Goal  #4   Title Patient to be independent in a home exercise program for continued strengthening and stretching   Time 4   Period Weeks   Status New            Plan - 01/24/16 [id number]    Clinical Impression Statement Came in with some soreness today from starting her HEP and left feeling about the same, after mutliple AA/ROM and some P/ROM exercises for right shoulder.  Added to HEP today.   Rehab Potential Excellent   Clinical Impairments Affecting Rehab Potential None   PT Frequency 2x / week   PT Duration 4 weeks   PT Treatment/Interventions Therapeutic exercise;Patient/family education;ADLs/Self Care Home Management;Passive range of motion;Manual techniques;Orthotic Fit/Training   PT Next Visit Plan continue PROM/AAROM/AROM to R shoulder   PT Home Exercise Plan supine dowel exercises, door stretches, hooklying lower trunk rotation   Consulted and Agree with Plan of Care Patient      Patient will benefit from skilled therapeutic intervention in order to improve the following deficits and impairments:  Decreased strength, Decreased knowledge of precautions, Pain, Impaired UE functional use, Decreased range of motion, Decreased scar  mobility  Visit Diagnosis: Stiffness of right shoulder, not elsewhere classified  Acute pain of right shoulder  Abnormal posture     Problem List Patient Active Problem List   Diagnosis Date Noted  . Hypersensitivity reaction 10/04/2015  . Genetic testing 09/09/2015  . Breast cancer of upper-outer quadrant of right female breast ([place]) 07/15/2015  . Moderate persistent asthma 02/25/2015  . Allergic rhinitis due to pollen 02/25/2015  . HIP PAIN, BILATERAL 07/26/2008  . ECZEMA, ATOPIC DERMATITIS 05/27/2006    SALISBURY,DONNA 01/24/2016, 9:00 AM  [place], [place], [address] Phone: [phone]   Fax:  [phone]  Name: Courtney Gomez MRN: [id number] Date of Birth: 03/07/1966  Serafina Royals, PT 01/24/16 9:00 AM

## 2016-01-27 ENCOUNTER — Ambulatory Visit
Admission: RE | Admit: 2016-01-27 | Discharge: 2016-01-27 | Disposition: A | Discharge status: Home / Self Care | Payer: 59 | Source: Ambulatory Visit | Attending: Radiation Oncology | Admitting: Radiation Oncology

## 2016-01-27 ENCOUNTER — Encounter: Payer: Self-pay | Admitting: Radiation Oncology

## 2016-01-27 VITALS — BP 127/78 | HR 93 | Resp 16 | Ht 66.0 in | Wt 140.6 lb

## 2016-01-27 DIAGNOSIS — Z171 Estrogen receptor negative status [ER-]: Secondary | ICD-10-CM | POA: Diagnosis present

## 2016-01-27 DIAGNOSIS — J45909 Unspecified asthma, uncomplicated: Secondary | ICD-10-CM | POA: Insufficient documentation

## 2016-01-27 DIAGNOSIS — C50411 Malignant neoplasm of upper-outer quadrant of right female breast: Secondary | ICD-10-CM

## 2016-01-27 HISTORY — DX: Malignant neoplasm of unspecified site of unspecified female breast: C50.919

## 2016-01-27 NOTE — Progress Notes (Signed)
See progress note under physician encounter. 

## 2016-01-27 NOTE — Progress Notes (Signed)
  Radiation Oncology         [phone] ________________________________  Name: Courtney Gomez MRN: [id number]  Date: 01/27/2016  DOB: [birth year]  SIMULATION AND TREATMENT PLANNING NOTE    ICD-9-CM ICD-10-CM   1. Malignant neoplasm of upper-outer quadrant of right breast in female, estrogen receptor negative (HCC) 174.4 C50.411    V86.1 Z17.1     DIAGNOSIS:   50 yo woman with clinical stage T1c N0 M0 triple negative invasive ductal carcinoma of the UOQ right breast status post neoadjuvant chemotherapy with complete response.  NARRATIVE:  The patient was brought to the [facility].  Identity was confirmed.  All relevant records and images related to the planned course of therapy were reviewed.  The patient freely provided informed written consent to proceed with treatment after reviewing the details related to the planned course of therapy. The consent form was witnessed and verified by the simulation staff.  Then, the patient was set-up in a stable reproducible  supine position for radiation therapy.  CT images were obtained.  Surface markings were placed.  The CT images were loaded into the planning software.  Then the target and avoidance structures were contoured.  Treatment planning then occurred.  The radiation prescription was entered and confirmed.  Then, I designed and supervised the construction of a total of 3 medically necessary complex treatment devices with BodyFix and 2 MLCs to shield lungs and heart.  I have requested : 3D Simulation  I have requested a DVH of the following structures: heart, left lung, right lung and lumpectomy site.  PLAN:  The patient will receive 50.4 Gy in 28 fractions followed by boost.  ________________________________  Sheral Apley. Tammi Klippel, M.D.

## 2016-01-27 NOTE — Progress Notes (Addendum)
Location of Breast Cancer: Malignant neoplasm of upper-outer quadrant of right breast in female, estrogen receptor negative ([place])  Histology per Pathology Report:     Receptor Status: ER(0), PR (0), Her2-neu (neg), Ki-(95%)  Did patient present with symptoms (if so, please note symptoms) or was this found on screening mammography?: On mammogram, she was found to have dense breasts and had a right breast mass on ultra sound which showed a 1.7 x 1.5 x 1.3 cm mass in the axillary tail. Axillary ultrasound was found to be negative.  Past/Anticipated interventions by surgeon, if any:  Right lumpectomy 01/08/2016: Right lumpectomy: Focal ALH, fibrocystic change, no residual cancer, 0/7 lymph nodes negative, triple negative disease, complete pathologic response. Surgical incision well approximated without redness, edema or drainage.  Past/Anticipated interventions by medical oncology, if any: Chemotherapy: Neoadj chemotherapy with Adriamycin and Cytoxan dose dense 4 foll by Taxol x 1 then Abraxaneweekly total 12 weeks from 08/02/2015 to 12/20/2015  Lymphedema issues, if any:  No  Demonstrates full ROM of both upper extremities.  Pain issues, if any:  No  SAFETY ISSUES:  Prior radiation? no  Pacemaker/ICD? no  Possible current pregnancy?no  Is the patient on methotrexate? no  Current Complaints / other details:  Seen by Dr. Pablo Ledger in [facility] on 07/24/15 Gynecologic History:  Age [age] first menstrual period? 13.5 Still having periods? No  Approximate date of last period? "on pill (until biopsy) for years)" Ever used hormone replacement? No  Obstetric History:  How many children have you carried to term? 2 Your age at first live birth: 45.5 Ever used birth control pills or hormone shots for contraception? No   Anxious to begin treatment as soon as possible. Last seen by Dr. Donne Hazel Thursday of last week.

## 2016-01-27 NOTE — Progress Notes (Signed)
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    ICD-9-CM ICD-10-CM   1. Malignant neoplasm of upper-outer quadrant of right breast in female, estrogen receptor negative (HCC) 174.4 C50.411    V86.1 Z17.1     HISTORY OF PRESENT ILLNESS::Timiyah L Rebuck is a 50 y.o. female who presented with a two week history of palpable right breast mass. ON mammogram, she was found to have dense breasts and had a right breast mass on ultra sound. This showed a 1.7 x 1.5 x 1.3 cm mass in the axillar tail. Axillary ultrasound was found to be negative. Core biopsy revealed grade 3 invasive ductal carcinoma triple negative with a Ki67 of 95%. She has a family history of a paternal grandmother with colon cancer.   Patient met with medical oncology and was prescribed neoadj chemotherapy with Adriamycin and Cytoxan dose dense 4 foll by Taxol x 1 then Abraxaneweekly. Total is 12 weeks from 08/02/2015 to 12/20/2015.  On 01/08/16, she had right breast seed guided lumpectomy and right sentinel node biopsy. Focal ALH, fibrocystic change, no residual cancer, 0/7 lymph nodes negative, triple negative disease, complete pathologic response. Surgical incision well approximated without redness, edema or drainage.  Patient denies lymphedema issues, or any pain at this time. She demonstrates full range of motion in both upper extremities. Patient notes she is anxious to begin treatment as soon as possible. She was last seen by Dr. Donne Hazel on Thursday of last week.  PREVIOUS RADIATION THERAPY: No  Past Medical History:  Diagnosis Date  . Anxiety    not currently  . Asthma   . Breast cancer ([place])   . Breast cancer of upper-outer quadrant of right female breast ([place]) 07/15/2015  . Eczema   . History of motion sickness   . PONV (postoperative nausea and vomiting)   :   Past Surgical History:  Procedure Laterality Date  . DENTAL SURGERY  1982   impacted teeth  . PORTACATH PLACEMENT Right 07/31/2015   Procedure: INSERTION PORT-A-CATH WITH ULTRASOUND ;  Surgeon: Rolm Bookbinder, MD;  Location: [place];  Service: General;  Laterality: Right;  . RADIOACTIVE SEED GUIDED MASTECTOMY WITH AXILLARY SENTINEL LYMPH NODE BIOPSY Right 01/08/2016   Procedure: RADIOACTIVE SEED LUMPECTOMY  WITH AXILLARY SENTINEL LYMPH NODE BIOPSY AND BLUE DYE INJECTION;  Surgeon: Rolm Bookbinder, MD;  Location: [place];  Service: General;  Laterality: Right;  :   Current Outpatient Prescriptions:  .  ADVAIR DISKUS 250-50 MCG/DOSE AEPB, Inhale 1 puff into the lungs daily. , Disp: , Rfl: 5 .  albuterol (PROAIR HFA) 108 (90 BASE) MCG/ACT inhaler, Inhale 2 puffs into the lungs every 6 (six) hours as needed for wheezing or shortness of breath. Reported on 08/23/2015, Disp: , Rfl:  .  cetirizine (ZYRTEC) 10 MG tablet, Take 10 mg by mouth daily. Reported on 08/23/2015, Disp: , Rfl:  .  fluticasone (FLONASE) 50 MCG/ACT nasal spray, Place 1 spray into both nostrils daily., Disp: 16 g, Rfl: 3 .  LORazepam (ATIVAN) 0.5 MG tablet, Take 1 tablet (0.5 mg total) by mouth at bedtime as needed., Disp: 30 tablet, Rfl: 0 .  montelukast (  SINGULAIR) 10 MG tablet, TAKE 1 TABLET (10 MG TOTAL) BY MOUTH AT BEDTIME., Disp: 30 tablet, Rfl: 3:   Allergies  Allergen Reactions  . Lanolin Itching  . Latex Itching and Swelling  :   Family History  Problem Relation Age of Onset  . Colon polyps Mother     hx of one large colon polyp s/p resection  . Other Mother     hx of benign teratoma and molar pregnancy  . Prostate cancer Father 30  . Colon polyps Father     possible  colon polyp hx; +diverticulitis  . Diverticulitis Father   . Other Maternal Uncle     hx of unspecified "lump on his back"  . Lupus Paternal Aunt   . Heart attack Paternal Uncle     d. 48s; smoker  . Heart Problems Paternal Uncle   . Colon cancer Maternal Grandmother 63    possible colon cancer  . Lung cancer Maternal Grandfather 68    smoker; +EtOH  . COPD Paternal Grandmother   . Heart Problems Paternal Grandmother   . Heart Problems Paternal Grandfather   . Prostate cancer Paternal Grandfather     possible prostate cancer dx. early 3s  . Stomach cancer Other     maternal great grandmother (MGF's mother); dx. late 54s  . Colon cancer Other 49    paternal great grandmother (PGM's mother)  . Stomach cancer Other     paternal great uncles (PGF's brother) d. late 50s-early 33s  :   Social History   Social History  . Marital status: Married    Spouse name: N/A  . Number of children: N/A  . Years of education: N/A   Occupational History  . Not on file.   Social History Main Topics  . Smoking status: Never Smoker  . Smokeless tobacco: Never Used  . Alcohol use 0.0 oz/week     Comment: ocassional  . Drug use: No  . Sexual activity: Not Currently     Comment: Was on BCP stopped 07/13/15   Other Topics Concern  . Not on file   Social History Narrative  . No narrative on file  :  REVIEW OF SYSTEMS:  A 15 point review of systems is documented in the electronic medical record. This was obtained by the nursing staff. However, I reviewed this with the patient to discuss relevant findings and make appropriate changes.  Pertinent items are noted in HPI.   PHYSICAL EXAM:  Blood pressure 127/78, pulse 93, resp. rate 16, height 5' 6"  (1.676 m), weight 140 lb 9.6 oz (63.8 kg), SpO2 100 %. Per med-onc: GENERAL:alert, no distress and comfortable SKIN: skin color, texture, turgor are normal, no rashes or significant lesions EYES: normal, Conjunctiva are pink and non-injected,  sclera clear OROPHARYNX:no exudate, no erythema and lips, buccal mucosa, and tongue normal  NECK: supple, thyroid normal size, non-tender, without nodularity LYMPH:  no palpable lymphadenopathy in the cervical, axillary or inguinal LUNGS: clear to auscultation and percussion with normal breathing effort HEART: regular rate & rhythm and no murmurs and no lower extremity edema ABDOMEN:abdomen soft, non-tender and normal bowel sounds MUSCULOSKELETAL:no cyanosis of digits and no clubbing  NEURO: alert & oriented x 3 with fluent speech, no focal motor/sensory deficits EXTREMITIES: No lower extremity edema Breast is healing well without drainage or infection    KPS = 100  100 - Normal; no complaints; no evidence of disease. 90   - Able to carry on normal activity; minor signs  or symptoms of disease. 80   - Normal activity with effort; some signs or symptoms of disease. 2   - Cares for self; unable to carry on normal activity or to do active work. 60   - Requires occasional assistance, but is able to care for most of his personal needs. 50   - Requires considerable assistance and frequent medical care. 26   - Disabled; requires special care and assistance. 65   - Severely disabled; hospital admission is indicated although death not imminent. 55   - Very sick; hospital admission necessary; active supportive treatment necessary. 10   - Moribund; fatal processes progressing rapidly. 0     - Dead  Karnofsky DA, Abelmann [place], Craver LS and Burchenal [facility] [phone]) The use of the nitrogen mustards in the palliative treatment of carcinoma: with particular reference to bronchogenic carcinoma Cancer 1 634-56  LABORATORY DATA:  Lab Results  Component Value Date   WBC 3.3 (L) 12/20/2015   HGB 11.6 12/20/2015   HCT 35.0 12/20/2015   MCV 99.9 12/20/2015   PLT 217 12/20/2015   Lab Results  Component Value Date   NA 144 12/20/2015   K 4.2 12/20/2015   CO2 28 12/20/2015   Lab Results  Component  Value Date   ALT 29 12/20/2015   AST 19 12/20/2015   ALKPHOS 80 12/20/2015   BILITOT 0.42 12/20/2015     RADIOGRAPHY: Nm Sentinel Node Inj-no Rpt (breast)  Result Date: 01/08/2016 CLINICAL DATA: right axillary sn biopsy Sulfur colloid was injected intradermally by the nuclear medicine technologist for breast cancer sentinel node localization.   Mm Breast Surgical Specimen  Result Date: 01/08/2016 CLINICAL DATA:  Right breast cancer status post lumpectomy. EXAM: SPECIMEN RADIOGRAPH OF THE RIGHT BREAST COMPARISON:  Previous exam(s). FINDINGS: Status post excision of the right breast. The radioactive seed and biopsy marker clip are present, completely intact, and were marked for pathology. IMPRESSION: Specimen radiograph of the right breast. Electronically Signed   By: Lillia Mountain M.D.   On: 01/08/2016 10:49   Mm Rt Radioactive Seed Loc Mammo Guide  Result Date: 01/03/2016 CLINICAL DATA:  50 year old female with right breast cancer diagnosed April 2017 post neoadjuvant chemotherapy. Most recent breast MRI dated 12/20/2015 did not show any residual disease. EXAM: MAMMOGRAPHIC GUIDED RADIOACTIVE SEED LOCALIZATION OF THE RIGHT BREAST COMPARISON:  Previous exam(s). FINDINGS: Patient presents for radioactive seed localization prior to right breast lumpectomy. I met with the patient and we discussed the procedure of seed localization including benefits and alternatives. We discussed the high likelihood of a successful procedure. We discussed the risks of the procedure including infection, bleeding, tissue injury and further surgery. We discussed the low dose of radioactivity involved in the procedure. Informed, written consent was given. The usual time-out protocol was performed immediately prior to the procedure. Initially an ultrasound of the upper-outer right breast was performed to determine if there is any residual mass sonographically to target for localization, however the known malignancy in the  upper-outer right breast could no longer be identified sonographically. Using 3D mammographic guidance, sterile technique, 1% lidocaine and an I-125 radioactive seed, the ribbon shaped biopsy marking clip in the far upper outer posterior right breast which is only seen on the MLO view only was localized using a medial to lateral approach. The follow-up mammogram images confirm the seed in the expected location and were marked for Dr. Donne Hazel. Follow-up survey of the patient confirms presence of the radioactive seed. Order number of I-125 seed:  [id number]. Total activity:  9.437 millicuries  Reference Date: 12/25/2015 The patient tolerated the procedure well and was released from the [facility]. She was given instructions regarding seed removal. IMPRESSION: Radioactive seed localization right breast. This was localized tomographically on the MLO view as the clip could never be identified on the CC view due to the far upper outer posterior location. Electronically Signed   By: Everlean Alstrom M.D.   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  PLAN:Today, I talked to the patient and family about the findings and work-up thus far.  We discussed the natural history of stage I breast cancer and general treatment, highlighting the role of radiotherapy in the management.  We discussed the available radiation techniques, and focused on the details of logistics and delivery.  We reviewed the anticipated acute and late sequelae associated with radiation in this setting.  The patient was encouraged to ask questions that I answered to the best of my ability.  The patient would like to proceed with radiation and will be scheduled for CT simulation today.  I spent 60 minutes minutes face to face with the patient and more than 50% of that time was spent in  counseling and/or coordination of care.   ------------------------------------------------   Tyler Pita, MD [place] Director and Director of Stereotactic Radiosurgery Direct Dial: [phone]  Fax: [phone] [email or web address]  Skype  [email or web address]  This document serves as a record of services personally performed by Tyler Pita, MD. It was created on his behalf by Bethann Humble, a trained medical scribe. The creation of this record is based on the scribe's personal observations and the provider's statements to them. This document has been checked and approved by the attending provider.

## 2016-01-28 ENCOUNTER — Ambulatory Visit: Payer: 59 | Admitting: Physical Therapy

## 2016-01-28 ENCOUNTER — Encounter: Payer: Self-pay | Admitting: Physical Therapy

## 2016-01-28 DIAGNOSIS — M25611 Stiffness of right shoulder, not elsewhere classified: Secondary | ICD-10-CM

## 2016-01-28 DIAGNOSIS — M25511 Pain in right shoulder: Secondary | ICD-10-CM

## 2016-01-28 NOTE — Therapy (Signed)
[place], [place], [address] Phone: [phone]   Fax:  [phone]  Physical Therapy Treatment  Patient Details  Name: Courtney Gomez MRN: [id number] Date of Birth: 1965-04-18 Referring Provider: Lindi Adie  Encounter Date: 01/28/2016      PT End of Session - 01/28/16 1659    Visit Number 3   Number of Visits 9   Date for PT Re-Evaluation 02/18/16   PT Start Time 1520   PT Stop Time 1600   PT Time Calculation (min) 40 min   Activity Tolerance Patient tolerated treatment well   Behavior During Therapy [facility] for tasks assessed/performed      Past Medical History:  Diagnosis Date  . Anxiety    not currently  . Asthma   . Breast cancer ([place])   . Breast cancer of upper-outer quadrant of right female breast ([place]) 07/15/2015  . Eczema   . History of motion sickness   . PONV (postoperative nausea and vomiting)     Past Surgical History:  Procedure Laterality Date  . DENTAL SURGERY  1982   impacted teeth  . PORTACATH PLACEMENT Right 07/31/2015   Procedure: INSERTION PORT-A-CATH WITH ULTRASOUND ;  Surgeon: Rolm Bookbinder, MD;  Location: [place];  Service: General;  Laterality: Right;  . RADIOACTIVE SEED GUIDED MASTECTOMY WITH AXILLARY SENTINEL LYMPH NODE BIOPSY Right 01/08/2016   Procedure: RADIOACTIVE SEED LUMPECTOMY  WITH AXILLARY SENTINEL LYMPH NODE BIOPSY AND BLUE DYE INJECTION;  Surgeon: Rolm Bookbinder, MD;  Location: [place];  Service: General;  Laterality: Right;    There were no vitals filed for this visit.      Subjective Assessment - 01/28/16 1525    Subjective I feel a little tighter today. I have been trying to do exercises at home. I was able to take my shirt off in a normal way last night.    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 It measures 1.7 cm in the upper outer quadrant  with a Ki67 of 95%. , Lumpectomy on 01/08/16 with lymph node dissection   Patient Stated Goals to reduce my risk of lymphedema, go back to golfing   Currently in Pain? No/denies   Pain Score 0-No pain                         [facility] Adult PT Treatment/Exercise - 01/28/16 0001      Shoulder Exercises: Supine   Other Supine Exercises over foam roller, relax scapulae back; flexion x 30 seconds x 3   Other Supine Exercises hooklying lower trunk rotation to left with right arm in abduction x 30 seconds     Shoulder Exercises: Pulleys   Flexion 2 minutes   ABduction 2 minutes     Shoulder Exercises: Therapy Ball   Flexion 10 reps   ABduction 10 reps     Manual Therapy   Manual Therapy Myofascial release;Passive ROM   Myofascial Release to scar tissue in right axilla while moving arm into abduction   Passive ROM passive er, abduction, flexion to pt. tolerance                      Breast Clinic Goals - 07/24/15 1519      Patient will be able to verbalize understanding of pertinent lymphedema risk reduction practices relevant to her diagnosis specifically related to skin care.  Time 1   Period Days   Status Achieved     Patient will be able to return demonstrate and/or verbalize understanding of the post-op home exercise program related to regaining shoulder range of motion.   Time 1   Period Days   Status Achieved     Patient will be able to verbalize understanding of the importance of attending the postoperative After Breast Cancer Class for further lymphedema risk reduction education and therapeutic exercise.   Time 1   Period Days   Status Achieved          Long Term Clinic Goals - 01/21/16 1221      CC Long Term Goal  #1   Title Pt to demonstrate 165 degrees of right shoulder flexion to allow her to reach items overhead   Baseline 123   Time 4   Period Weeks   Status New     CC Long Term Goal  #2   Title Pt to demonstrate 160 degrees  of right shoulder abduction to allow her to reach items out to sides   Baseline 94   Time 4   Period Weeks   Status New     CC Long Term Goal  #3   Title Pt to be able to independently verbalize lymphedema risk reduction practices   Time 4   Period Weeks   Status New     CC Long Term Goal  #4   Title Patient to be independent in a home exercise program for continued strengthening and stretching   Time 4   Period Weeks   Status New            Plan - 01/28/16 1659    Clinical Impression Statement Patient did well with all exercises today. She has a fibrotic area of scar tissue in right axilla that causes tightness with UE ROM. Myofascial release performed to this area during PROM to pt's tolerance. She is demonstrating overall improvements in ROM.    Rehab Potential Excellent   Clinical Impairments Affecting Rehab Potential None   PT Frequency 2x / week   PT Duration 4 weeks   PT Treatment/Interventions Therapeutic exercise;Patient/family education;ADLs/Self Care Home Management;Passive range of motion;Manual techniques;Orthotic Fit/Training   PT Next Visit Plan continue PROM/AAROM/AROM to R shoulder   PT Home Exercise Plan supine dowel exercises, door stretches, hooklying lower trunk rotation   Consulted and Agree with Plan of Care Patient      Patient will benefit from skilled therapeutic intervention in order to improve the following deficits and impairments:  Decreased strength, Decreased knowledge of precautions, Pain, Impaired UE functional use, Decreased range of motion, Decreased scar mobility  Visit Diagnosis: Stiffness of right shoulder, not elsewhere classified  Acute pain of right shoulder     Problem List Patient Active Problem List   Diagnosis Date Noted  . Hypersensitivity reaction 10/04/2015  . Genetic testing 09/09/2015  . Breast cancer of upper-outer quadrant of right female breast ([place]) 07/15/2015  . Moderate persistent asthma 02/25/2015  .  Allergic rhinitis due to pollen 02/25/2015  . HIP PAIN, BILATERAL 07/26/2008  . ECZEMA, ATOPIC DERMATITIS 05/27/2006    Courtney Gomez 01/28/2016, 5:03 PM  [place], [place], [address] Phone: [phone]   Fax:  [phone]  Name: Courtney Gomez MRN: [id number] Date of Birth: [birth year]  Allyson Sabal, PT 01/28/16 5:04 PM

## 2016-01-29 DIAGNOSIS — C50411 Malignant neoplasm of upper-outer quadrant of right female breast: Secondary | ICD-10-CM | POA: Diagnosis not present

## 2016-01-30 ENCOUNTER — Ambulatory Visit
Admission: RE | Admit: 2016-01-30 | Discharge: 2016-01-30 | Disposition: A | Discharge status: Home / Self Care | Payer: 59 | Source: Ambulatory Visit | Attending: Radiation Oncology | Admitting: Radiation Oncology

## 2016-01-30 ENCOUNTER — Encounter: Payer: Self-pay | Admitting: Radiation Oncology

## 2016-01-30 ENCOUNTER — Ambulatory Visit: Payer: 59 | Attending: Hematology and Oncology | Admitting: Physical Therapy

## 2016-01-30 ENCOUNTER — Encounter: Payer: Self-pay | Admitting: Physical Therapy

## 2016-01-30 VITALS — BP 118/67 | HR 96 | Temp 98.5°F | Resp 20 | Ht 66.0 in | Wt 140.0 lb

## 2016-01-30 DIAGNOSIS — M25511 Pain in right shoulder: Secondary | ICD-10-CM | POA: Diagnosis present

## 2016-01-30 DIAGNOSIS — M25611 Stiffness of right shoulder, not elsewhere classified: Secondary | ICD-10-CM | POA: Diagnosis not present

## 2016-01-30 DIAGNOSIS — C50411 Malignant neoplasm of upper-outer quadrant of right female breast: Secondary | ICD-10-CM | POA: Insufficient documentation

## 2016-01-30 DIAGNOSIS — R293 Abnormal posture: Secondary | ICD-10-CM | POA: Diagnosis present

## 2016-01-30 DIAGNOSIS — Z170 Estrogen receptor positive status [ER+]: Secondary | ICD-10-CM

## 2016-01-30 MED ORDER — ALRA NON-METALLIC DEODORANT (RAD-ONC)
1.0000 "application " | Freq: Once | TOPICAL | Status: AC
  Administered 2016-01-30: 1 via TOPICAL

## 2016-01-30 MED ORDER — RADIAPLEXRX EX GEL
Freq: Once | CUTANEOUS | Status: AC
  Administered 2016-01-30: 18:00:00 via TOPICAL

## 2016-01-30 NOTE — Progress Notes (Signed)
  Radiation Oncology         [phone]   Name: Courtney Gomez MRN: [id number]   Date: 01/30/2016  DOB: 24-Mar-1966   Weekly Radiation Therapy Management    ICD-9-CM ICD-10-CM   1. Malignant neoplasm of upper-outer quadrant of right breast in female, estrogen receptor positive (HCC) 174.4 C50.411 hyaluronate sodium (RADIAPLEXRX) gel   [id number] non-metallic deodorant (ALRA) 1 application    Current Dose: 1.8 Gy  Planned Dose: 50.4 Gy  Narrative The patient presents for routine under treatment assessment.  Patient reports skin irritation, swelling, and tenderness of the breast. She also reports fatigue. Otherwise, patient is without complaint. Nursing provided radiaplex and alra.   The patient is without complaint. Set-up films were reviewed. The chart was checked.  Physical Findings  height is 5\' 6"  (1.676 m) and weight is 140 lb (63.5 kg). Her oral temperature is 98.5 F (36.9 C). Her blood pressure is 118/67 and her pulse is 96. Her respiration is 20. . Weight essentially stable.  No significant changes.  Impression The patient is tolerating radiation.  Plan Continue treatment as planned.         Sheral Apley Tammi Klippel, M.D.   This document serves as a record of services personally performed by Tyler Pita, MD. It was created on his behalf by Maryla Morrow, a trained medical scribe. The creation of this record is based on the scribe's personal observations and the provider's statements to them. This document has been checked and approved by the attending provider.

## 2016-01-30 NOTE — Progress Notes (Addendum)
Weekly rad txs 1st treatment right breast, patient education done, skin irritation, swelling/tenderness breast, fatigue, radiaplex, alra,  Radiation therapy and you book, Joaquim Lai RN business card, given too patint, radiaplex for soothing and moisturizing skin affected by radiation, use after rad tx, and in am, but not 4 hours prior to rad tx, deodorant also same,  no pain, increase protein in diet, stay hydrated,drink plenty fluids,  ses MD and RN weekly and prn, verbal understanding, teach back given.  Wt Readings from Last 3 Encounters:  01/30/16 140 lb (63.5 kg)  01/27/16 140 lb 9.6 oz (63.8 kg)  01/15/16 142 lb 8 oz (64.6 kg)  BP 118/67 (BP Location: Left Arm, Patient Position: Sitting, Cuff Size: Normal)   Pulse 96   Temp 98.5 F (36.9 C) (Oral)   Resp 20   Ht 5\' 6"  (1.676 m)   Wt 140 lb (63.5 kg)   BMI 22.60 kg/m

## 2016-01-30 NOTE — Therapy (Signed)
[place], [place], [address] Phone: [phone]   Fax:  [phone]  Physical Therapy Treatment  Patient Details  Name: Courtney Gomez MRN: [id number] Date of Birth: [birth year] Referring Provider: Lindi Adie  Encounter Date: 01/30/2016      PT End of Session - 01/30/16 1655    Visit Number 4   Number of Visits 9   Date for PT Re-Evaluation 02/18/16   PT Start Time 1609   PT Stop Time 1650   PT Time Calculation (min) 41 min   Activity Tolerance Patient tolerated treatment well   Behavior During Therapy [facility] for tasks assessed/performed      Past Medical History:  Diagnosis Date  . Anxiety    not currently  . Asthma   . Breast cancer ([place])   . Breast cancer of upper-outer quadrant of right female breast ([place]) 07/15/2015  . Eczema   . History of motion sickness   . PONV (postoperative nausea and vomiting)     Past Surgical History:  Procedure Laterality Date  . DENTAL SURGERY  1982   impacted teeth  . PORTACATH PLACEMENT Right 07/31/2015   Procedure: INSERTION PORT-A-CATH WITH ULTRASOUND ;  Surgeon: Rolm Bookbinder, MD;  Location: [place];  Service: General;  Laterality: Right;  . RADIOACTIVE SEED GUIDED MASTECTOMY WITH AXILLARY SENTINEL LYMPH NODE BIOPSY Right 01/08/2016   Procedure: RADIOACTIVE SEED LUMPECTOMY  WITH AXILLARY SENTINEL LYMPH NODE BIOPSY AND BLUE DYE INJECTION;  Surgeon: Rolm Bookbinder, MD;  Location: [place];  Service: General;  Laterality: Right;    There were no vitals filed for this visit.      Subjective Assessment - 01/30/16 1610    Subjective Last night my arm felt so bad that I felt grumpy. I think this neuropathy is either changing or going away. I think it might be some nerve stuff.    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 It measures 1.7 cm in the upper outer  quadrant with a Ki67 of 95%. , Lumpectomy on 01/08/16 with lymph node dissection   Patient Stated Goals to reduce my risk of lymphedema, go back to golfing   Currently in Pain? No/denies   Pain Score 0-No pain                         [facility] Adult PT Treatment/Exercise - 01/30/16 0001      Shoulder Exercises: Pulleys   Flexion 2 minutes   ABduction 2 minutes     Shoulder Exercises: Therapy Ball   Flexion 10 reps   ABduction 10 reps     Shoulder Exercises: Stretch   Corner Stretch 3 reps;30 seconds     Manual Therapy   Manual Therapy Myofascial release;Passive ROM   Myofascial Release to scar tissue in right axilla while moving arm into abduction   Passive ROM passive er, abduction, flexion to pt. tolerance                       Long Term Clinic Goals - 01/30/16 1613      CC Long Term Goal  #1   Title Pt to demonstrate 165 degrees of right shoulder flexion to allow her to reach items overhead   Baseline 123, 01/30/16- 150 degrees   Time 4   Period Weeks   Status On-going     CC Long Term  Goal  #2   Title Pt to demonstrate 160 degrees of right shoulder abduction to allow her to reach items out to sides   Baseline 94, 01/30/16- 120   Time 4   Period Weeks   Status On-going     CC Long Term Goal  #3   Title Pt to be able to independently verbalize lymphedema risk reduction practices   Baseline 01/30/16- pt able to independently verbalize all    Time 4   Period Weeks   Status Achieved     CC Long Term Goal  #4   Title Patient to be independent in a home exercise program for continued strengthening and stretching   Time 4   Period Weeks   Status On-going            Plan - 01/30/16 1656    Clinical Impression Statement Patient arrived to appointment feeling very tight. Patient has made great progress towards her goals in therapy. Patient completed ROM exerices and PROM and myofascial release was performed today to R UE and axilla.  Patient left feeling better than when she arrived and stated her arm felt more loose.    Rehab Potential Excellent   Clinical Impairments Affecting Rehab Potential None   PT Frequency 2x / week   PT Duration 4 weeks   PT Treatment/Interventions Therapeutic exercise;Patient/family education;ADLs/Self Care Home Management;Passive range of motion;Manual techniques;Orthotic Fit/Training   PT Next Visit Plan continue PROM/AAROM/AROM to R shoulder, myofascial to right lumpectomy scar   PT Home Exercise Plan supine dowel exercises, door stretches, hooklying lower trunk rotation   Consulted and Agree with Plan of Care Patient      Patient will benefit from skilled therapeutic intervention in order to improve the following deficits and impairments:  Decreased strength, Decreased knowledge of precautions, Pain, Impaired UE functional use, Decreased range of motion, Decreased scar mobility  Visit Diagnosis: Stiffness of right shoulder, not elsewhere classified  Acute pain of right shoulder  Abnormal posture     Problem List Patient Active Problem List   Diagnosis Date Noted  . Hypersensitivity reaction 10/04/2015  . Genetic testing 09/09/2015  . Breast cancer of upper-outer quadrant of right female breast ([place]) 07/15/2015  . Moderate persistent asthma 02/25/2015  . Allergic rhinitis due to pollen 02/25/2015  . HIP PAIN, BILATERAL 07/26/2008  . ECZEMA, ATOPIC DERMATITIS 05/27/2006    Alexia Freestone 01/30/2016, 5:02 PM  [place], [place], [address] Phone: [phone]   Fax:  [phone]  Name: Courtney Gomez MRN: [id number] Date of Birth: May 21, 1965  Allyson Sabal, PT 01/30/16 5:02 PM

## 2016-01-31 ENCOUNTER — Ambulatory Visit
Admission: RE | Admit: 2016-01-31 | Discharge: 2016-01-31 | Disposition: A | Discharge status: Home / Self Care | Payer: 59 | Source: Ambulatory Visit | Attending: Radiation Oncology | Admitting: Radiation Oncology

## 2016-01-31 DIAGNOSIS — C50411 Malignant neoplasm of upper-outer quadrant of right female breast: Secondary | ICD-10-CM | POA: Diagnosis not present

## 2016-02-03 ENCOUNTER — Ambulatory Visit
Admission: RE | Admit: 2016-02-03 | Discharge: 2016-02-03 | Disposition: A | Discharge status: Home / Self Care | Payer: 59 | Source: Ambulatory Visit | Attending: Radiation Oncology | Admitting: Radiation Oncology

## 2016-02-03 ENCOUNTER — Encounter: Payer: Self-pay | Admitting: Physical Therapy

## 2016-02-03 ENCOUNTER — Ambulatory Visit: Payer: 59 | Admitting: Physical Therapy

## 2016-02-03 DIAGNOSIS — R293 Abnormal posture: Secondary | ICD-10-CM

## 2016-02-03 DIAGNOSIS — M25511 Pain in right shoulder: Secondary | ICD-10-CM

## 2016-02-03 DIAGNOSIS — C50411 Malignant neoplasm of upper-outer quadrant of right female breast: Secondary | ICD-10-CM | POA: Diagnosis not present

## 2016-02-03 DIAGNOSIS — M25611 Stiffness of right shoulder, not elsewhere classified: Secondary | ICD-10-CM

## 2016-02-03 NOTE — Therapy (Signed)
[place], [place], [address] Phone: [phone]   Fax:  [phone]  Physical Therapy Treatment  Patient Details  Name: Courtney Gomez MRN: [id number] Date of Birth: 04-10-1965 Referring Provider: Lindi Adie  Encounter Date: 02/03/2016      PT End of Session - 02/03/16 1624    Visit Number 5   Number of Visits 9   Date for PT Re-Evaluation 02/18/16   PT Start Time 6948   PT Stop Time 1603   PT Time Calculation (min) 40 min   Activity Tolerance Patient tolerated treatment well   Behavior During Therapy [facility] for tasks assessed/performed      Past Medical History:  Diagnosis Date  . Anxiety    not currently  . Asthma   . Breast cancer ([place])   . Breast cancer of upper-outer quadrant of right female breast ([place]) 07/15/2015  . Eczema   . History of motion sickness   . PONV (postoperative nausea and vomiting)     Past Surgical History:  Procedure Laterality Date  . DENTAL SURGERY  1982   impacted teeth  . PORTACATH PLACEMENT Right 07/31/2015   Procedure: INSERTION PORT-A-CATH WITH ULTRASOUND ;  Surgeon: Rolm Bookbinder, MD;  Location: [place];  Service: General;  Laterality: Right;  . RADIOACTIVE SEED GUIDED MASTECTOMY WITH AXILLARY SENTINEL LYMPH NODE BIOPSY Right 01/08/2016   Procedure: RADIOACTIVE SEED LUMPECTOMY  WITH AXILLARY SENTINEL LYMPH NODE BIOPSY AND BLUE DYE INJECTION;  Surgeon: Rolm Bookbinder, MD;  Location: [place];  Service: General;  Laterality: Right;    There were no vitals filed for this visit.      Subjective Assessment - 02/03/16 1525    Subjective I think the scar tissue might be getting a little smaller. I am still tight.    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 It measures 1.7 cm in the upper outer quadrant with a Ki67 of 95%. , Lumpectomy on 01/08/16 with lymph node  dissection   Patient Stated Goals to reduce my risk of lymphedema, go back to golfing   Currently in Pain? No/denies   Pain Score 0-No pain                         [facility] Adult PT Treatment/Exercise - 02/03/16 0001      Shoulder Exercises: Supine   Horizontal ABduction Strengthening;Both;10 reps;Theraband   Theraband Level (Shoulder Horizontal ABduction) Level 1 (Yellow)   External Rotation Strengthening;Both;10 reps;Theraband   Theraband Level (Shoulder External Rotation) Level 1 (Yellow)   Flexion Strengthening;Both;10 reps;Theraband   Theraband Level (Shoulder Flexion) Level 1 (Yellow)   Other Supine Exercises D2 bilaterally x 10 with yellow band     Shoulder Exercises: Pulleys   Flexion 2 minutes   ABduction 2 minutes     Shoulder Exercises: Therapy Ball   Flexion 10 reps   ABduction 10 reps     Shoulder Exercises: Stretch   Corner Stretch 3 reps;30 seconds     Manual Therapy   Manual Therapy Myofascial release;Passive ROM   Myofascial Release to scar tissue in right axilla while moving arm into abduction   Passive ROM passive er, abduction, flexion, IR to pt. tolerance               Long Term Clinic Goals - 01/30/16 1613      CC Long Term Goal  #1  Title Pt to demonstrate 165 degrees of right shoulder flexion to allow her to reach items overhead   Baseline 123, 01/30/16- 150 degrees   Time 4   Period Weeks   Status On-going     CC Long Term Goal  #2   Title Pt to demonstrate 160 degrees of right shoulder abduction to allow her to reach items out to sides   Baseline 94, 01/30/16- 120   Time 4   Period Weeks   Status On-going     CC Long Term Goal  #3   Title Pt to be able to independently verbalize lymphedema risk reduction practices   Baseline 01/30/16- pt able to independently verbalize all    Time 4   Period Weeks   Status Achieved     CC Long Term Goal  #4   Title Patient to be independent in a home exercise program for continued  strengthening and stretching   Time 4   Period Weeks   Status On-going            Plan - 02/03/16 1625    Clinical Impression Statement Patient is still feeling frustrated with her tightness though she does admit it is improving. She also has noticed a decrease in scar tissue under right lumpectomy scar. Patient was able to move through entire abduction range of motion today passively which at beginning of session she was unable. Instructed pt in supine scapular stabilization exercises today.    Rehab Potential Excellent   Clinical Impairments Affecting Rehab Potential None   PT Frequency 2x / week   PT Duration 4 weeks   PT Treatment/Interventions Therapeutic exercise;Patient/family education;ADLs/Self Care Home Management;Passive range of motion;Manual techniques;Orthotic Fit/Training   PT Next Visit Plan assess goals, continue PROM/AAROM/AROM to R shoulder, myofascial to right lumpectomy scar, assess indep with supine scap series and change to red band   PT Home Exercise Plan supine dowel exercises, door stretches, hooklying lower trunk rotation, supine scap series   Consulted and Agree with Plan of Care Patient      Patient will benefit from skilled therapeutic intervention in order to improve the following deficits and impairments:  Decreased strength, Decreased knowledge of precautions, Pain, Impaired UE functional use, Decreased range of motion, Decreased scar mobility  Visit Diagnosis: Stiffness of right shoulder, not elsewhere classified  Acute pain of right shoulder  Abnormal posture     Problem List Patient Active Problem List   Diagnosis Date Noted  . Hypersensitivity reaction 10/04/2015  . Genetic testing 09/09/2015  . Breast cancer of upper-outer quadrant of right female breast ([place]) 07/15/2015  . Moderate persistent asthma 02/25/2015  . Allergic rhinitis due to pollen 02/25/2015  . HIP PAIN, BILATERAL 07/26/2008  . ECZEMA, ATOPIC DERMATITIS 05/27/2006     Alexia Freestone 02/03/2016, 4:28 PM  [place], [place], [address] Phone: [phone]   Fax:  [phone]  Name: Courtney Gomez MRN: [id number] Date of Birth: Jul 31, 1965   Allyson Sabal, PT 02/03/16 4:28 PM

## 2016-02-03 NOTE — Patient Instructions (Signed)
Over Head Pull: Narrow Grip     [place] [phone]   On back, knees bent, feet flat, band across thighs, elbows straight but relaxed. Pull hands apart (start). Keeping elbows straight, bring arms up and over head, hands toward floor. Keep pull steady on band. Hold momentarily. Return slowly, keeping pull steady, back to start. Repeat _10__ times. Band color ___Y___   Side Pull: Double Arm   On back, knees bent, feet flat. Arms perpendicular to body, shoulder level, elbows straight but relaxed. Pull arms out to sides, elbows straight. Resistance band comes across collarbones, hands toward floor. Hold momentarily. Slowly return to starting position. Repeat _10__ times. Band color _Y____   Sash   On back, knees bent, feet flat, left hand on left hip, right hand above left. Pull right arm DIAGONALLY (hip to shoulder) across chest. Bring right arm along head toward floor. Hold momentarily. Slowly return to starting position. Repeat __10_ times. Do with left arm. Band color __Y____   Shoulder Rotation: Double Arm   On back, knees bent, feet flat, elbows tucked at sides, bent 90, hands palms up. Pull hands apart and down toward floor, keeping elbows near sides. Hold momentarily. Slowly return to starting position. Repeat _10__ times. Band color _Y_____

## 2016-02-04 ENCOUNTER — Ambulatory Visit
Admission: RE | Admit: 2016-02-04 | Discharge: 2016-02-04 | Disposition: A | Discharge status: Home / Self Care | Payer: 59 | Source: Ambulatory Visit | Attending: Radiation Oncology | Admitting: Radiation Oncology

## 2016-02-04 DIAGNOSIS — C50411 Malignant neoplasm of upper-outer quadrant of right female breast: Secondary | ICD-10-CM | POA: Diagnosis not present

## 2016-02-05 ENCOUNTER — Ambulatory Visit
Admission: RE | Admit: 2016-02-05 | Discharge: 2016-02-05 | Disposition: A | Discharge status: Home / Self Care | Payer: 59 | Source: Ambulatory Visit | Attending: Radiation Oncology | Admitting: Radiation Oncology

## 2016-02-05 DIAGNOSIS — C50411 Malignant neoplasm of upper-outer quadrant of right female breast: Secondary | ICD-10-CM | POA: Diagnosis not present

## 2016-02-06 ENCOUNTER — Encounter: Payer: Self-pay | Admitting: Physical Therapy

## 2016-02-06 ENCOUNTER — Ambulatory Visit: Payer: 59 | Admitting: Physical Therapy

## 2016-02-06 ENCOUNTER — Ambulatory Visit
Admission: RE | Admit: 2016-02-06 | Discharge: 2016-02-06 | Disposition: A | Discharge status: Home / Self Care | Payer: 59 | Source: Ambulatory Visit | Attending: Radiation Oncology | Admitting: Radiation Oncology

## 2016-02-06 DIAGNOSIS — M25611 Stiffness of right shoulder, not elsewhere classified: Secondary | ICD-10-CM

## 2016-02-06 DIAGNOSIS — R293 Abnormal posture: Secondary | ICD-10-CM

## 2016-02-06 DIAGNOSIS — M25511 Pain in right shoulder: Secondary | ICD-10-CM

## 2016-02-06 DIAGNOSIS — C50411 Malignant neoplasm of upper-outer quadrant of right female breast: Secondary | ICD-10-CM | POA: Diagnosis not present

## 2016-02-06 NOTE — Therapy (Signed)
[place], [place], [address] Phone: [phone]   Fax:  [phone]  Physical Therapy Treatment  Patient Details  Name: Courtney Gomez MRN: [id number] Date of Birth: [birth year] Referring Provider: Lindi Adie  Encounter Date: 02/06/2016      PT End of Session - 02/06/16 1702    Visit Number 6   Number of Visits 9   Date for PT Re-Evaluation 02/18/16   PT Start Time 1520   PT Stop Time 1603   PT Time Calculation (min) 43 min   Activity Tolerance Patient tolerated treatment well   Behavior During Therapy [facility] for tasks assessed/performed      Past Medical History:  Diagnosis Date  . Anxiety    not currently  . Asthma   . Breast cancer ([place])   . Breast cancer of upper-outer quadrant of right female breast ([place]) 07/15/2015  . Eczema   . History of motion sickness   . PONV (postoperative nausea and vomiting)     Past Surgical History:  Procedure Laterality Date  . DENTAL SURGERY  1982   impacted teeth  . PORTACATH PLACEMENT Right 07/31/2015   Procedure: INSERTION PORT-A-CATH WITH ULTRASOUND ;  Surgeon: Rolm Bookbinder, MD;  Location: [place];  Service: General;  Laterality: Right;  . RADIOACTIVE SEED GUIDED MASTECTOMY WITH AXILLARY SENTINEL LYMPH NODE BIOPSY Right 01/08/2016   Procedure: RADIOACTIVE SEED LUMPECTOMY  WITH AXILLARY SENTINEL LYMPH NODE BIOPSY AND BLUE DYE INJECTION;  Surgeon: Rolm Bookbinder, MD;  Location: [place];  Service: General;  Laterality: Right;    There were no vitals filed for this visit.      Subjective Assessment - 02/06/16 1522    Subjective I am still grumpy about the scar but I still feel it is getting smaller. I think my neuropathy is getting better. It is amazing how tight my arm gets when I don't move it.    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 It measures  1.7 cm in the upper outer quadrant with a Ki67 of 95%. , Lumpectomy on 01/08/16 with lymph node dissection   Patient Stated Goals to reduce my risk of lymphedema, go back to golfing   Currently in Pain? No/denies   Pain Score 0-No pain                         [facility] Adult PT Treatment/Exercise - 02/06/16 0001      Shoulder Exercises: Supine   Horizontal ABduction Strengthening;Both;10 reps;Theraband  with 10 sec holds   Theraband Level (Shoulder Horizontal ABduction) Level 2 (Red)   External Rotation Strengthening;Both;10 reps;Theraband  with 10 sec holds   Theraband Level (Shoulder External Rotation) Level 2 (Red)   Flexion Strengthening;Both;10 reps;Theraband  with 5 sec holds   Theraband Level (Shoulder Flexion) Level 2 (Red)   Other Supine Exercises D2 bilaterally x 10 with red band     Shoulder Exercises: Pulleys   Flexion 2 minutes   ABduction 2 minutes     Shoulder Exercises: Therapy Ball   Flexion 10 reps   ABduction 10 reps     Shoulder Exercises: Stretch   Corner Stretch 3 reps;30 seconds     Manual Therapy   Manual Therapy Myofascial release;Passive ROM   Myofascial Release to scar tissue in right axilla while moving arm into abduction   Passive ROM passive er, abduction, flexion, IR to  pt. tolerance                 Long Term Clinic Goals - 02/06/16 1524      CC Long Term Goal  #1   Title Pt to demonstrate 165 degrees of right shoulder flexion to allow her to reach items overhead   Baseline 123, 01/30/16- 150 degrees, 02/06/16- 156 degrees   Time 4   Period Weeks   Status On-going     CC Long Term Goal  #2   Title Pt to demonstrate 160 degrees of right shoulder abduction to allow her to reach items out to sides   Baseline 94, 01/30/16- 120, 02/06/16- 150 degrees   Time 4   Period Weeks   Status On-going     CC Long Term Goal  #3   Title Pt to be able to independently verbalize lymphedema risk reduction practices   Baseline  01/30/16- pt able to independently verbalize all, 02/06/16- pt independent with verbalizing this   Time 4   Period Weeks   Status Achieved     CC Long Term Goal  #4   Title Patient to be independent in a home exercise program for continued strengthening and stretching   Time 4   Period Weeks   Status On-going            Plan - 02/06/16 1702    Clinical Impression Statement Patient demonstrates progress towards goals in therapy today. Her ROM continues to improve. She also demonstrates softening of scar tissue under right lumpectomy scar. Progressed pt's exercises today from yellow theraband to red theraband. Pt is very motived to participate in therapy.    Rehab Potential Excellent   Clinical Impairments Affecting Rehab Potential None   PT Frequency 2x / week   PT Duration 4 weeks   PT Treatment/Interventions Therapeutic exercise;Patient/family education;ADLs/Self Care Home Management;Passive range of motion;Manual techniques;Orthotic Fit/Training   PT Next Visit Plan continue PROM/AAROM/AROM to R shoulder, myofascial to right lumpectomy scar   PT Home Exercise Plan supine dowel exercises, door stretches, hooklying lower trunk rotation, supine scap series   Consulted and Agree with Plan of Care Patient      Patient will benefit from skilled therapeutic intervention in order to improve the following deficits and impairments:  Decreased strength, Decreased knowledge of precautions, Pain, Impaired UE functional use, Decreased range of motion, Decreased scar mobility  Visit Diagnosis: Stiffness of right shoulder, not elsewhere classified  Acute pain of right shoulder  Abnormal posture     Problem List Patient Active Problem List   Diagnosis Date Noted  . Hypersensitivity reaction 10/04/2015  . Genetic testing 09/09/2015  . Breast cancer of upper-outer quadrant of right female breast ([place]) 07/15/2015  . Moderate persistent asthma 02/25/2015  . Allergic rhinitis due to pollen  02/25/2015  . HIP PAIN, BILATERAL 07/26/2008  . ECZEMA, ATOPIC DERMATITIS 05/27/2006    Alexia Freestone 02/06/2016, 5:05 PM  [place], [place], [address] Phone: [phone]   Fax:  [phone]  Name: Courtney Gomez MRN: [id number] Date of Birth: [birth year]  Allyson Sabal, PT 02/06/16 5:05 PM

## 2016-02-07 ENCOUNTER — Ambulatory Visit
Admission: RE | Admit: 2016-02-07 | Discharge: 2016-02-07 | Disposition: A | Discharge status: Home / Self Care | Payer: 59 | Source: Ambulatory Visit | Attending: Radiation Oncology | Admitting: Radiation Oncology

## 2016-02-07 VITALS — BP 159/74 | HR 93 | Resp 16 | Wt 141.2 lb

## 2016-02-07 DIAGNOSIS — Z170 Estrogen receptor positive status [ER+]: Secondary | ICD-10-CM

## 2016-02-07 DIAGNOSIS — C50411 Malignant neoplasm of upper-outer quadrant of right female breast: Secondary | ICD-10-CM | POA: Diagnosis not present

## 2016-02-07 NOTE — Progress Notes (Signed)
Weight and vitals stable. Denies pain.  No skin changes within treatment field of right breast. Reports using radiaplex and alra as directed. No evidence of lymphedema. Denies fatigue.   BP (!) 159/74 (BP Location: Left Arm, Patient Position: Sitting, Cuff Size: Normal)   Pulse 93   Resp 16   Wt 141 lb 3.2 oz (64 kg)   SpO2 100%   BMI 22.79 kg/m  Wt Readings from Last 3 Encounters:  02/07/16 141 lb 3.2 oz (64 kg)  01/30/16 140 lb (63.5 kg)  01/27/16 140 lb 9.6 oz (63.8 kg)

## 2016-02-07 NOTE — Progress Notes (Signed)
  Radiation Oncology         [phone]   Name: Courtney Gomez MRN: [id number]   Date: 02/07/2016  DOB: [birth year]   Weekly Radiation Therapy Management    ICD-9-CM ICD-10-CM   1. Malignant neoplasm of upper-outer quadrant of right breast in female, estrogen receptor positive (HCC) 174.4 C50.411    V86.0 Z17.0     Current Dose: 12.6 Gy  Planned Dose: 50.4 Gy  Narrative The patient presents for routine under treatment assessment.  Denies pain. No skin changes within treatment field of the right breast. Reports using radiaplex and alra as directed. No evidence of lymphedema. Denies fatigue. Reports a "weird sensation" in her right hand. States that the whole hand feels sore and the feeling comes and goes.  Set-up films were reviewed. The chart was checked.  Physical Findings  weight is 141 lb 3.2 oz (64 kg). Her blood pressure is 159/74 (abnormal) and her pulse is 93. Her respiration is 16 and oxygen saturation is 100%. . Weight essentially stable.  No significant changes.  Impression The patient is tolerating radiation. The patient's hand symptoms could be caused by her positioning during treatment.  Plan Continue treatment as planned.         Sheral Apley Tammi Klippel, M.D.  This document serves as a record of services personally performed by Tyler Pita, MD. It was created on his behalf by Darcus Austin, a trained medical scribe. The creation of this record is based on the scribe's personal observations and the provider's statements to them. This document has been checked and approved by the attending provider.

## 2016-02-10 ENCOUNTER — Ambulatory Visit: Payer: 59 | Admitting: Physical Therapy

## 2016-02-10 ENCOUNTER — Ambulatory Visit
Admission: RE | Admit: 2016-02-10 | Discharge: 2016-02-10 | Disposition: A | Discharge status: Home / Self Care | Payer: 59 | Source: Ambulatory Visit | Attending: Radiation Oncology | Admitting: Radiation Oncology

## 2016-02-10 ENCOUNTER — Encounter: Payer: Self-pay | Admitting: Physical Therapy

## 2016-02-10 DIAGNOSIS — R293 Abnormal posture: Secondary | ICD-10-CM

## 2016-02-10 DIAGNOSIS — M25611 Stiffness of right shoulder, not elsewhere classified: Secondary | ICD-10-CM

## 2016-02-10 DIAGNOSIS — C50411 Malignant neoplasm of upper-outer quadrant of right female breast: Secondary | ICD-10-CM | POA: Diagnosis not present

## 2016-02-10 DIAGNOSIS — M25511 Pain in right shoulder: Secondary | ICD-10-CM

## 2016-02-10 NOTE — Therapy (Signed)
[place], [place], [address] Phone: [phone]   Fax:  [phone]  Physical Therapy Treatment  Patient Details  Name: Courtney Gomez MRN: [id number] Date of Birth: 1965-06-28 Referring Provider: Lindi Adie  Encounter Date: 02/10/2016      PT End of Session - 02/10/16 1730    Visit Number 7   Number of Visits 9   Date for PT Re-Evaluation 02/18/16   PT Start Time 3244   PT Stop Time 1602   PT Time Calculation (min) 41 min   Activity Tolerance Patient tolerated treatment well   Behavior During Therapy [facility] for tasks assessed/performed      Past Medical History:  Diagnosis Date  . Anxiety    not currently  . Asthma   . Breast cancer ([place])   . Breast cancer of upper-outer quadrant of right female breast ([place]) 07/15/2015  . Eczema   . History of motion sickness   . PONV (postoperative nausea and vomiting)     Past Surgical History:  Procedure Laterality Date  . DENTAL SURGERY  1982   impacted teeth  . PORTACATH PLACEMENT Right 07/31/2015   Procedure: INSERTION PORT-A-CATH WITH ULTRASOUND ;  Surgeon: Rolm Bookbinder, MD;  Location: [place];  Service: General;  Laterality: Right;  . RADIOACTIVE SEED GUIDED MASTECTOMY WITH AXILLARY SENTINEL LYMPH NODE BIOPSY Right 01/08/2016   Procedure: RADIOACTIVE SEED LUMPECTOMY  WITH AXILLARY SENTINEL LYMPH NODE BIOPSY AND BLUE DYE INJECTION;  Surgeon: Rolm Bookbinder, MD;  Location: [place];  Service: General;  Laterality: Right;    There were no vitals filed for this visit.      Subjective Assessment - 02/10/16 1524    Subjective My arm is doing good. I found out I have range of motion to throw a ball.    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 It measures 1.7 cm in the upper outer quadrant with a Ki67 of 95%. , Lumpectomy on 01/08/16 with lymph node  dissection   Patient Stated Goals to reduce my risk of lymphedema, go back to golfing   Currently in Pain? Yes   Pain Score 1   from dancing too much   Pain Location Arm   Pain Orientation Right   Pain Descriptors / Indicators Dull   Pain Type Acute pain   Pain Onset In the past 7 days   Pain Frequency Intermittent                         [facility] Adult PT Treatment/Exercise - 02/10/16 0001      Shoulder Exercises: Supine   Horizontal ABduction Strengthening;Both;10 reps;Theraband  with 10 sec holds   Theraband Level (Shoulder Horizontal ABduction) Level 3 (Green)   External Rotation Strengthening;Both;10 reps;Theraband  with 10 sec holds   Theraband Level (Shoulder External Rotation) Level 3 (Green)   Flexion Strengthening;Both;10 reps;Theraband  with 5 sec holds   Theraband Level (Shoulder Flexion) Level 3 (Green)   Other Supine Exercises D2 bilaterally x 10 with green band     Manual Therapy   Manual Therapy Myofascial release;Passive ROM   Myofascial Release to scar tissue in right axilla while moving arm into abduction   Passive ROM passive er, abduction, flexion, IR to pt. tolerance             Long Term Clinic Goals - 02/06/16 1524  CC Long Term Goal  #1   Title Pt to demonstrate 165 degrees of right shoulder flexion to allow her to reach items overhead   Baseline 123, 01/30/16- 150 degrees, 02/06/16- 156 degrees   Time 4   Period Weeks   Status On-going     CC Long Term Goal  #2   Title Pt to demonstrate 160 degrees of right shoulder abduction to allow her to reach items out to sides   Baseline 94, 01/30/16- 120, 02/06/16- 150 degrees   Time 4   Period Weeks   Status On-going     CC Long Term Goal  #3   Title Pt to be able to independently verbalize lymphedema risk reduction practices   Baseline 01/30/16- pt able to independently verbalize all, 02/06/16- pt independent with verbalizing this   Time 4   Period Weeks   Status Achieved      CC Long Term Goal  #4   Title Patient to be independent in a home exercise program for continued strengthening and stretching   Time 4   Period Weeks   Status On-going            Plan - 02/10/16 1731    Clinical Impression Statement Patient continues to demonstrate improvement with her ROM. She had less tightness during PROM today. Following scar mobilization there was significant softening of scar tissue. Patient progressed from red theraband today to green theraband.    Rehab Potential Excellent   Clinical Impairments Affecting Rehab Potential None   PT Frequency 2x / week   PT Duration 4 weeks   PT Treatment/Interventions Therapeutic exercise;Patient/family education;ADLs/Self Care Home Management;Passive range of motion;Manual techniques;Orthotic Fit/Training   PT Next Visit Plan continue PROM/AAROM/AROM to R shoulder, myofascial to right lumpectomy scar, begin weights   PT Home Exercise Plan supine dowel exercises, door stretches, hooklying lower trunk rotation, supine scap series   Consulted and Agree with Plan of Care Patient      Patient will benefit from skilled therapeutic intervention in order to improve the following deficits and impairments:  Decreased strength, Decreased knowledge of precautions, Pain, Impaired UE functional use, Decreased range of motion, Decreased scar mobility  Visit Diagnosis: Stiffness of right shoulder, not elsewhere classified  Acute pain of right shoulder  Abnormal posture     Problem List Patient Active Problem List   Diagnosis Date Noted  . Hypersensitivity reaction 10/04/2015  . Genetic testing 09/09/2015  . Breast cancer of upper-outer quadrant of right female breast ([place]) 07/15/2015  . Moderate persistent asthma 02/25/2015  . Allergic rhinitis due to pollen 02/25/2015  . HIP PAIN, BILATERAL 07/26/2008  . ECZEMA, ATOPIC DERMATITIS 05/27/2006    Alexia Freestone 02/10/2016, 5:33 PM  [place], [place], [address] Phone: [phone]   Fax:  [phone]  Name: Courtney Gomez MRN: [id number] Date of Birth: 1965/11/30  Allyson Sabal, PT 02/10/16 5:34 PM

## 2016-02-11 ENCOUNTER — Ambulatory Visit
Admission: RE | Admit: 2016-02-11 | Discharge: 2016-02-11 | Disposition: A | Discharge status: Home / Self Care | Payer: 59 | Source: Ambulatory Visit | Attending: Radiation Oncology | Admitting: Radiation Oncology

## 2016-02-11 DIAGNOSIS — C50411 Malignant neoplasm of upper-outer quadrant of right female breast: Secondary | ICD-10-CM | POA: Diagnosis not present

## 2016-02-12 ENCOUNTER — Ambulatory Visit
Admission: RE | Admit: 2016-02-12 | Discharge: 2016-02-12 | Disposition: A | Discharge status: Home / Self Care | Payer: 59 | Source: Ambulatory Visit | Attending: Radiation Oncology | Admitting: Radiation Oncology

## 2016-02-12 DIAGNOSIS — C50411 Malignant neoplasm of upper-outer quadrant of right female breast: Secondary | ICD-10-CM | POA: Diagnosis not present

## 2016-02-13 ENCOUNTER — Ambulatory Visit
Admission: RE | Admit: 2016-02-13 | Discharge: 2016-02-13 | Disposition: A | Discharge status: Home / Self Care | Payer: 59 | Source: Ambulatory Visit | Attending: Radiation Oncology | Admitting: Radiation Oncology

## 2016-02-13 ENCOUNTER — Ambulatory Visit: Payer: 59 | Admitting: Radiation Oncology

## 2016-02-13 DIAGNOSIS — C50411 Malignant neoplasm of upper-outer quadrant of right female breast: Secondary | ICD-10-CM | POA: Diagnosis not present

## 2016-02-14 ENCOUNTER — Ambulatory Visit
Admission: RE | Admit: 2016-02-14 | Discharge: 2016-02-14 | Disposition: A | Discharge status: Home / Self Care | Payer: 59 | Source: Ambulatory Visit | Attending: Radiation Oncology | Admitting: Radiation Oncology

## 2016-02-14 ENCOUNTER — Encounter: Payer: Self-pay | Admitting: Physical Therapy

## 2016-02-14 ENCOUNTER — Ambulatory Visit: Payer: 59 | Admitting: Physical Therapy

## 2016-02-14 DIAGNOSIS — C50411 Malignant neoplasm of upper-outer quadrant of right female breast: Secondary | ICD-10-CM | POA: Diagnosis not present

## 2016-02-14 DIAGNOSIS — M25611 Stiffness of right shoulder, not elsewhere classified: Secondary | ICD-10-CM

## 2016-02-14 DIAGNOSIS — M25511 Pain in right shoulder: Secondary | ICD-10-CM

## 2016-02-14 NOTE — Therapy (Signed)
[place], [place], [address] Phone: [phone]   Fax:  [phone]  Physical Therapy Treatment  Patient Details  Name: Courtney Gomez MRN: [id number] Date of Birth: [birth year] Referring Provider: Lindi Adie  Encounter Date: 02/14/2016      PT End of Session - 02/14/16 1035    Visit Number 8   Number of Visits 9   Date for PT Re-Evaluation 02/18/16   PT Start Time 0819  pt arrived late secondary to radiation   PT Stop Time 0848   PT Time Calculation (min) 29 min   Activity Tolerance Patient tolerated treatment well   Behavior During Therapy [facility] for tasks assessed/performed      Past Medical History:  Diagnosis Date  . Anxiety    not currently  . Asthma   . Breast cancer ([place])   . Breast cancer of upper-outer quadrant of right female breast ([place]) 07/15/2015  . Eczema   . History of motion sickness   . PONV (postoperative nausea and vomiting)     Past Surgical History:  Procedure Laterality Date  . DENTAL SURGERY  1982   impacted teeth  . PORTACATH PLACEMENT Right 07/31/2015   Procedure: INSERTION PORT-A-CATH WITH ULTRASOUND ;  Surgeon: Rolm Bookbinder, MD;  Location: [place];  Service: General;  Laterality: Right;  . RADIOACTIVE SEED GUIDED MASTECTOMY WITH AXILLARY SENTINEL LYMPH NODE BIOPSY Right 01/08/2016   Procedure: RADIOACTIVE SEED LUMPECTOMY  WITH AXILLARY SENTINEL LYMPH NODE BIOPSY AND BLUE DYE INJECTION;  Surgeon: Rolm Bookbinder, MD;  Location: [place];  Service: General;  Laterality: Right;    There were no vitals filed for this visit.      Subjective Assessment - 02/14/16 0820    Subjective My tightness now seems to be transferred to my forearm. I am doing my exercises with the green band once a day.    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 It measures 1.7 cm in the upper  outer quadrant with a Ki67 of 95%. , Lumpectomy on 01/08/16 with lymph node dissection   Patient Stated Goals to reduce my risk of lymphedema, go back to golfing   Currently in Pain? No/denies   Pain Score 0-No pain                         [facility] Adult PT Treatment/Exercise - 02/14/16 0001      Shoulder Exercises: Standing   Other Standing Exercises ulnar and medial nerve stretches x 1 rep each x 15 sec     Manual Therapy   Manual Therapy Myofascial release;Passive ROM   Myofascial Release to scar tissue in right axilla, median nerve stretches with arm abducted to 90 degrees passively extending and flexing wrist   Passive ROM passive abduction and flexion to pt's tolerance                    Long Term Clinic Goals - 02/14/16 0824      CC Long Term Goal  #1   Title Pt to demonstrate 165 degrees of right shoulder flexion to allow her to reach items overhead   Baseline 123, 01/30/16- 150 degrees, 02/06/16- 156 degrees, 02/14/16- 165 degrees   Time 4   Period Weeks   Status Achieved     CC Long Term Goal  #2   Title Pt to demonstrate 160 degrees of right  shoulder abduction to allow her to reach items out to sides   Baseline 94, 01/30/16- 120, 02/06/16- 150 degrees, 02/14/16- 162 degrees   Time 4   Period Weeks   Status Achieved     CC Long Term Goal  #3   Title Pt to be able to independently verbalize lymphedema risk reduction practices   Baseline 01/30/16- pt able to independently verbalize all, 02/06/16- pt independent with verbalizing this   Time 4   Period Weeks   Status Achieved     CC Long Term Goal  #4   Title Patient to be independent in a home exercise program for continued strengthening and stretching   Period Weeks   Status On-going     CC Long Term Goal  #5   Title Pt reports 50% improvement in scar tissue under right lumpectomy scar to improve comfort   Time 4   Period Weeks   Status New            Plan - 02/14/16 1036     Clinical Impression Statement Pt reports today with increased tightness and discomfort in her forearm. Instructed pt in ulnar and median nerve stretch today and pt stated she "really felt them." Assessed pt's progress towards her goals. She has now met all of her ROM goals but would benefit from focusing on her forearm tightness and scar tissue which still persists.    Rehab Potential Excellent   Clinical Impairments Affecting Rehab Potential None   PT Frequency 2x / week   PT Duration 4 weeks   PT Treatment/Interventions Therapeutic exercise;Patient/family education;ADLs/Self Care Home Management;Passive range of motion;Manual techniques;Orthotic Fit/Training   PT Next Visit Plan continue PROM/AAROM/AROM to R shoulder, myofascial to right lumpectomy scar, begin weights- assess indep with median nerve stretch   PT Home Exercise Plan recert?, supine dowel exercises, door stretches, hooklying lower trunk rotation, supine scap series   Consulted and Agree with Plan of Care Patient      Patient will benefit from skilled therapeutic intervention in order to improve the following deficits and impairments:  Decreased strength, Decreased knowledge of precautions, Pain, Impaired UE functional use, Decreased range of motion, Decreased scar mobility  Visit Diagnosis: Stiffness of right shoulder, not elsewhere classified  Acute pain of right shoulder     Problem List Patient Active Problem List   Diagnosis Date Noted  . Hypersensitivity reaction 10/04/2015  . Genetic testing 09/09/2015  . Breast cancer of upper-outer quadrant of right female breast ([place]) 07/15/2015  . Moderate persistent asthma 02/25/2015  . Allergic rhinitis due to pollen 02/25/2015  . HIP PAIN, BILATERAL 07/26/2008  . ECZEMA, ATOPIC DERMATITIS 05/27/2006    Alexia Freestone 02/14/2016, 10:38 AM  [place], [place], [address] Phone:  [phone]   Fax:  [phone]  Name: PAULITA LICKLIDER MRN: [id number] Date of Birth: 1965/08/06  Allyson Sabal, PT 02/14/16 10:39 AM

## 2016-02-16 ENCOUNTER — Encounter: Payer: Self-pay | Admitting: Radiation Oncology

## 2016-02-16 ENCOUNTER — Ambulatory Visit
Admission: RE | Admit: 2016-02-16 | Discharge: 2016-02-16 | Disposition: A | Discharge status: Home / Self Care | Payer: 59 | Source: Ambulatory Visit | Attending: Radiation Oncology | Admitting: Radiation Oncology

## 2016-02-16 VITALS — BP 133/72 | HR 98 | Temp 99.7°F | Resp 20 | Wt 138.0 lb

## 2016-02-16 DIAGNOSIS — Z170 Estrogen receptor positive status [ER+]: Secondary | ICD-10-CM

## 2016-02-16 DIAGNOSIS — C50411 Malignant neoplasm of upper-outer quadrant of right female breast: Secondary | ICD-10-CM | POA: Diagnosis not present

## 2016-02-16 NOTE — Progress Notes (Signed)
  Radiation Oncology         [phone]   Name: Courtney Gomez MRN: [id number]   Date: 02/16/2016  DOB: [birth year]   Weekly Radiation Therapy Management    ICD-9-CM ICD-10-CM   1. Malignant neoplasm of upper-outer quadrant of right breast in female, estrogen receptor positive (HCC) 174.4 C50.411    V86.0 Z17.0     Current Dose: 23.4 Gy  Planned Dose: 50.4 Gy  Narrative The patient presents for routine under treatment assessment.  Weekly rad txs to the right breast. Mild erythema and swelling underneath the right axilla. She still reports ttightness in the right arm. Using radiaplex BID, good appetite, and denies fatigue.  Set-up films were reviewed. The chart was checked.  Physical Findings  weight is 138 lb (62.6 kg). Her oral temperature is 99.7 F (37.6 C). Her blood pressure is 133/72 and her pulse is 98. Her respiration is 20. . Weight essentially stable.  No significant changes.  Impression The patient is tolerating radiation. The patient's right arm symptoms could be caused by her positioning during treatment.  Plan Continue treatment as planned.         Sheral Apley Tammi Klippel, M.D.  This document serves as a record of services personally performed by Tyler Pita, MD. It was created on his behalf by Darcus Austin, a trained medical scribe. The creation of this record is based on the scribe's personal observations and the provider's statements to them. This document has been checked and approved by the attending provider.

## 2016-02-16 NOTE — Progress Notes (Signed)
wekly rad txs  Right breast, mild erythema,a, swelling under axilla , tightness in arm still,  Using radiaplex bid, appetite good, no fatigue  BP 133/72 (BP Location: Left Arm, Patient Position: Sitting, Cuff Size: Normal)   Pulse 98   Temp 99.7 F (37.6 C) (Oral)   Resp 20   Wt 138 lb (62.6 kg)   BMI 22.27 kg/m   Wt Readings from Last 3 Encounters:  02/16/16 138 lb (62.6 kg)  02/07/16 141 lb 3.2 oz (64 kg)  01/30/16 140 lb (63.5 kg)

## 2016-02-17 ENCOUNTER — Ambulatory Visit
Admission: RE | Admit: 2016-02-17 | Discharge: 2016-02-17 | Disposition: A | Discharge status: Home / Self Care | Payer: 59 | Source: Ambulatory Visit | Attending: Radiation Oncology | Admitting: Radiation Oncology

## 2016-02-17 DIAGNOSIS — C50411 Malignant neoplasm of upper-outer quadrant of right female breast: Secondary | ICD-10-CM | POA: Diagnosis not present

## 2016-02-18 ENCOUNTER — Ambulatory Visit
Admission: RE | Admit: 2016-02-18 | Discharge: 2016-02-18 | Disposition: A | Discharge status: Home / Self Care | Payer: 59 | Source: Ambulatory Visit | Attending: Radiation Oncology | Admitting: Radiation Oncology

## 2016-02-18 ENCOUNTER — Encounter: Payer: Self-pay | Admitting: Hematology and Oncology

## 2016-02-18 DIAGNOSIS — C50411 Malignant neoplasm of upper-outer quadrant of right female breast: Secondary | ICD-10-CM | POA: Diagnosis not present

## 2016-02-19 ENCOUNTER — Ambulatory Visit
Admission: RE | Admit: 2016-02-19 | Discharge: 2016-02-19 | Disposition: A | Discharge status: Home / Self Care | Payer: 59 | Source: Ambulatory Visit | Attending: Radiation Oncology | Admitting: Radiation Oncology

## 2016-02-19 ENCOUNTER — Encounter: Payer: Self-pay | Admitting: Radiation Oncology

## 2016-02-19 VITALS — BP 128/83 | HR 86 | Temp 98.8°F | Ht 66.0 in | Wt 139.2 lb

## 2016-02-19 DIAGNOSIS — Z170 Estrogen receptor positive status [ER+]: Secondary | ICD-10-CM

## 2016-02-19 DIAGNOSIS — C50411 Malignant neoplasm of upper-outer quadrant of right female breast: Secondary | ICD-10-CM

## 2016-02-19 NOTE — Progress Notes (Signed)
  Radiation Oncology         [phone]   Name: Courtney Gomez MRN: [id number]   Date: 02/19/2016  DOB: 1965/05/21   Weekly Radiation Therapy Management    ICD-9-CM ICD-10-CM   1. Malignant neoplasm of upper-outer quadrant of right breast in female, estrogen receptor positive ([place]) 174.4 C50.411    V86.0 Z17.0     Current Dose: 28.8 Gy  Planned Dose: 50.4 Gy  Narrative The patient presents for routine under treatment assessment.  Courtney Gomez has completed 16 fractions to her right breast.  She denies pain except for nerve pain in her right arm.  She is using radiaplex once a day.  She denies having fatigue.  Nursing notes, the skin on her right breast is red. She would like to have her port removed before the end of the year. The port is on her right side.   Set-up films were reviewed. The chart was checked.  Physical Findings  height is 5\' 6"  (1.676 m) and weight is 139 lb 3.2 oz (63.1 kg). Her oral temperature is 98.8 F (37.1 C). Her blood pressure is 128/83 and her pulse is 86. Her oxygen saturation is 100%. . Weight essentially stable.  No significant changes. Faint erythema with no desquamation in the treatment field.  Impression The patient is tolerating radiation.   Plan Continue treatment as planned. I will send a note to Dr. Lindi Adie about the patient possibly having her port removed before the end of the calendar year.          Sheral Apley Tammi Klippel, M.D.  This document serves as a record of services personally performed by Tyler Pita, MD. It was created on his behalf by Arlyce Harman, a trained medical scribe. The creation of this record is based on the scribe's personal observations and the provider's statements to them. This document has been checked and approved by the attending provider.

## 2016-02-19 NOTE — Progress Notes (Signed)
[place] Courtney Gomez has completed 16 fractions to her right breast.  She denies pain except for nerve pain in her right arm.  She is using radiaplex once a day.  She denies having fatigue.  The skin on her right breast is red.  BP 128/83 (BP Location: Left Arm, Patient Position: Sitting)   Pulse 86   Temp 98.8 F (37.1 C) (Oral)   Ht 5\' 6"  (1.676 m)   Wt 139 lb 3.2 oz (63.1 kg)   SpO2 100%   BMI 22.47 kg/m    Wt Readings from Last 3 Encounters:  02/19/16 139 lb 3.2 oz (63.1 kg)  02/16/16 138 lb (62.6 kg)  02/07/16 141 lb 3.2 oz (64 kg)

## 2016-02-21 ENCOUNTER — Ambulatory Visit: Payer: 59

## 2016-02-24 ENCOUNTER — Encounter: Payer: Self-pay | Admitting: *Deleted

## 2016-02-24 ENCOUNTER — Ambulatory Visit
Admission: RE | Admit: 2016-02-24 | Discharge: 2016-02-24 | Disposition: A | Discharge status: Home / Self Care | Payer: 59 | Source: Ambulatory Visit | Attending: Radiation Oncology | Admitting: Radiation Oncology

## 2016-02-24 DIAGNOSIS — C50411 Malignant neoplasm of upper-outer quadrant of right female breast: Secondary | ICD-10-CM | POA: Diagnosis not present

## 2016-02-25 ENCOUNTER — Ambulatory Visit
Admission: RE | Admit: 2016-02-25 | Discharge: 2016-02-25 | Disposition: A | Discharge status: Home / Self Care | Payer: 59 | Source: Ambulatory Visit | Attending: Radiation Oncology | Admitting: Radiation Oncology

## 2016-02-25 DIAGNOSIS — C50411 Malignant neoplasm of upper-outer quadrant of right female breast: Secondary | ICD-10-CM | POA: Diagnosis not present

## 2016-02-26 ENCOUNTER — Ambulatory Visit
Admission: RE | Admit: 2016-02-26 | Discharge: 2016-02-26 | Disposition: A | Discharge status: Home / Self Care | Payer: 59 | Source: Ambulatory Visit | Attending: Radiation Oncology | Admitting: Radiation Oncology

## 2016-02-26 ENCOUNTER — Ambulatory Visit: Payer: 59

## 2016-02-26 DIAGNOSIS — C50411 Malignant neoplasm of upper-outer quadrant of right female breast: Secondary | ICD-10-CM | POA: Diagnosis not present

## 2016-02-26 DIAGNOSIS — M25511 Pain in right shoulder: Secondary | ICD-10-CM

## 2016-02-26 DIAGNOSIS — R293 Abnormal posture: Secondary | ICD-10-CM

## 2016-02-26 DIAGNOSIS — M25611 Stiffness of right shoulder, not elsewhere classified: Secondary | ICD-10-CM | POA: Diagnosis not present

## 2016-02-26 NOTE — Therapy (Signed)
Courtney Gomez, [place], [address] Phone: [phone]   Fax:  [phone]  Physical Therapy Treatment  Patient Details  Name: Courtney Gomez MRN: [id number] Date of Birth: 1965-09-20 Referring Provider: Lindi Adie  Encounter Date: 02/26/2016      PT End of Session - 02/26/16 1616    Visit Number 9   Number of Visits 17   Date for PT Re-Evaluation 03/25/16   PT Start Time 2633   PT Stop Time 1601   PT Time Calculation (min) 38 min   Activity Tolerance Patient tolerated treatment well   Behavior During Therapy [facility] for tasks assessed/performed      Past Medical History:  Diagnosis Date  . Anxiety    not currently  . Asthma   . Breast cancer ([place])   . Breast cancer of upper-outer quadrant of right female breast ([place]) 07/15/2015  . Eczema   . History of motion sickness   . PONV (postoperative nausea and vomiting)     Past Surgical History:  Procedure Laterality Date  . DENTAL SURGERY  1982   impacted teeth  . PORTACATH PLACEMENT Right 07/31/2015   Procedure: INSERTION PORT-A-CATH WITH ULTRASOUND ;  Surgeon: Rolm Bookbinder, MD;  Location: [place];  Service: General;  Laterality: Right;  . RADIOACTIVE SEED GUIDED MASTECTOMY WITH AXILLARY SENTINEL LYMPH NODE BIOPSY Right 01/08/2016   Procedure: RADIOACTIVE SEED LUMPECTOMY  WITH AXILLARY SENTINEL LYMPH NODE BIOPSY AND BLUE DYE INJECTION;  Surgeon: Rolm Bookbinder, MD;  Location: [place];  Service: General;  Laterality: Right;    There were no vitals filed for this visit.      Subjective Assessment - 02/26/16 1529    Subjective I went walking over 3 miles yesterday with a friend and was doing my stretches and red band exercises throughout that time and my forearm felt okay but today it feels tighter. Would like to keep coming until that resolves.    Pertinent History Patient was diagnosed 07/11/15 with right triple negative grade 3  invasive ductal carcinoma breast cancer. It measures 1.7 cm in the upper outer quadrant with a Ki67 of 95%. , Lumpectomy on 01/08/16 with lymph node dissection   Patient Stated Goals to reduce my risk of lymphedema, go back to golfing                         [facility] Adult PT Treatment/Exercise - 02/26/16 0001      Shoulder Exercises: Stretch   Other Shoulder Stretches Neural tension stretch on wall with hand/wrist in neutral and flexing/extending the elbow 3 times for 5 second holds. Tried other nerve stretches but this one was most beneficial for pt.      Manual Therapy   Manual Therapy Myofascial release;Passive ROM;Neural Stretch   Myofascial Release To Rt axilla and near incision during P/ROM; also at antecubital fossa with arm at 80 degrees abduction with neural tension stretch only to pts tolerance, this was very sensitive for pt.   Passive ROM In Supine into abduction, flexion, and er to her tolerance   Neural Stretch With arm at 80 and 90 degrees abduction to pts tolerance                PT Education - 02/26/16 1624    Education provided Yes   Education Details Neural tension stretching on wall and myofacial release to her Rt axilla as was done today.   Person(s) Educated Patient  Methods Explanation;Demonstration;Handout   Comprehension Verbalized understanding;Returned demonstration;Need further instruction  Furhter instruction for myofascial release to Rt axilla/incision area              Breast Clinic Goals - 07/24/15 1519      Patient will be able to verbalize understanding of pertinent lymphedema risk reduction practices relevant to her diagnosis specifically related to skin care.   Time 1   Period Days   Status Achieved     Patient will be able to return demonstrate and/or verbalize understanding of the post-op home exercise program related to regaining shoulder range of motion.   Time 1   Period Days   Status Achieved     Patient  will be able to verbalize understanding of the importance of attending the postoperative After Breast Cancer Class for further lymphedema risk reduction education and therapeutic exercise.   Time 1   Period Days   Status Achieved          Long Term Clinic Goals - 02/26/16 1626      CC Long Term Goal  #1   Title Pt to demonstrate 165 degrees of right shoulder flexion to allow her to reach items overhead   Baseline 123, 01/30/16- 150 degrees, 02/06/16- 156 degrees, 02/14/16- 165 degrees   Status Achieved     CC Long Term Goal  #2   Title Pt to demonstrate 160 degrees of right shoulder abduction to allow her to reach items out to sides   Baseline 94, 01/30/16- 120, 02/06/16- 150 degrees, 02/14/16- 162 degrees   Status Achieved     CC Long Term Goal  #3   Title Pt to be able to independently verbalize lymphedema risk reduction practices   Baseline 01/30/16- pt able to independently verbalize all, 02/06/16- pt independent with verbalizing this   Status Achieved     CC Long Term Goal  #4   Title Patient to be independent in a home exercise program for continued strengthening and stretching   Status On-going     CC Long Term Goal  #5   Title Pt reports 50% improvement in scar tissue under right lumpectomy scar to improve comfort   Status On-going            Plan - 02/26/16 1618    Clinical Impression Statement Pt reports increased tightness today in her forearm though she said it felt okay when doing stretches yesterday when walking with friend. Pt had alot of trouble relaxing fully/very protective of end range stretching which would increase her nerve pain. So iinformed pt that trying to do gentle nerve stretches during her 3 mile walk yesterday as her body was probably tensing up didn't help her nerve pain and she agreed. So issued wall neural tension stretch for pt and instructed her how to try myofascial release to her Rt axilla/incision area with arm in abduction with er position and  she is willing to try this. She did report after session her forearm tightness was much imporved and was able to extend her wrist more with hardly any nerve pain. Per PT's last note pt would benefit from continuing therapy to help her regain her end ROM and decrease axillary tightness causing referred nerve pain.   Rehab Potential Excellent   Clinical Impairments Affecting Rehab Potential None   PT Frequency 2x / week   PT Duration 4 weeks   PT Treatment/Interventions Therapeutic exercise;Patient/family education;ADLs/Self Care Home Management;Passive range of motion;Manual techniques;Orthotic Fit/Training   PT Next Visit  Plan Renewal done this visit. Cont 2x/wk for 4 wks focusing on myofascial release to Rt axilla and incision with neural tension stretching educating pt in same; also to progress HEP to include weights (start with 3 weight raises if not already doing?)   PT Home Exercise Plan Try Rt axillary myofascial release with UE in abduction with er. Cont red theraband strength and neural tension stretching but not while walking   Consulted and Agree with Plan of Care Patient      Patient will benefit from skilled therapeutic intervention in order to improve the following deficits and impairments:  Decreased strength, Decreased knowledge of precautions, Pain, Impaired UE functional use, Decreased range of motion, Decreased scar mobility  Visit Diagnosis: Stiffness of right shoulder, not elsewhere classified  Acute pain of right shoulder  Abnormal posture     Problem List Patient Active Problem List   Diagnosis Date Noted  . Hypersensitivity reaction 10/04/2015  . Genetic testing 09/09/2015  . Breast cancer of upper-outer quadrant of right female breast ([place]) 07/15/2015  . Moderate persistent asthma 02/25/2015  . Allergic rhinitis due to pollen 02/25/2015  . HIP PAIN, BILATERAL 07/26/2008  . ECZEMA, ATOPIC DERMATITIS 05/27/2006    Otelia Limes, PTA 02/26/2016,  4:27 PM  [place], [place], [address] Phone: [phone]   Fax:  [phone]  Name: Courtney Gomez MRN: [id number] Date of Birth: 04/27/1965   Serafina Royals, PT 02/26/16 5:11 PM

## 2016-02-27 ENCOUNTER — Ambulatory Visit
Admission: RE | Admit: 2016-02-27 | Discharge: 2016-02-27 | Disposition: A | Discharge status: Home / Self Care | Payer: 59 | Source: Ambulatory Visit | Attending: Radiation Oncology | Admitting: Radiation Oncology

## 2016-02-27 DIAGNOSIS — C50411 Malignant neoplasm of upper-outer quadrant of right female breast: Secondary | ICD-10-CM | POA: Diagnosis not present

## 2016-02-28 ENCOUNTER — Ambulatory Visit
Admission: RE | Admit: 2016-02-28 | Discharge: 2016-02-28 | Disposition: A | Discharge status: Home / Self Care | Payer: 59 | Source: Ambulatory Visit | Attending: Radiation Oncology | Admitting: Radiation Oncology

## 2016-02-28 ENCOUNTER — Ambulatory Visit: Payer: 59 | Attending: Hematology and Oncology | Admitting: Physical Therapy

## 2016-02-28 ENCOUNTER — Encounter: Payer: Self-pay | Admitting: Physical Therapy

## 2016-02-28 DIAGNOSIS — M25511 Pain in right shoulder: Secondary | ICD-10-CM | POA: Diagnosis present

## 2016-02-28 DIAGNOSIS — M25611 Stiffness of right shoulder, not elsewhere classified: Secondary | ICD-10-CM | POA: Diagnosis not present

## 2016-02-28 DIAGNOSIS — C50411 Malignant neoplasm of upper-outer quadrant of right female breast: Secondary | ICD-10-CM | POA: Diagnosis not present

## 2016-02-28 DIAGNOSIS — Z170 Estrogen receptor positive status [ER+]: Secondary | ICD-10-CM

## 2016-02-28 DIAGNOSIS — R293 Abnormal posture: Secondary | ICD-10-CM | POA: Diagnosis present

## 2016-02-28 NOTE — Therapy (Signed)
[place], [place], [address] Phone: [phone]   Fax:  [phone]  Physical Therapy Treatment  Patient Details  Name: Courtney Gomez MRN: [id number] Date of Birth: [birth year] Referring Provider: Lindi Adie  Encounter Date: 02/28/2016      PT End of Session - 02/28/16 1210    Visit Number 10   Number of Visits 17   Date for PT Re-Evaluation 03/25/16   PT Start Time 0850   PT Stop Time 0931   PT Time Calculation (min) 41 min   Activity Tolerance Patient tolerated treatment well   Behavior During Therapy [facility] for tasks assessed/performed      Past Medical History:  Diagnosis Date  . Anxiety    not currently  . Asthma   . Breast cancer ([place])   . Breast cancer of upper-outer quadrant of right female breast ([place]) 07/15/2015  . Eczema   . History of motion sickness   . PONV (postoperative nausea and vomiting)     Past Surgical History:  Procedure Laterality Date  . DENTAL SURGERY  1982   impacted teeth  . PORTACATH PLACEMENT Right 07/31/2015   Procedure: INSERTION PORT-A-CATH WITH ULTRASOUND ;  Surgeon: Rolm Bookbinder, MD;  Location: [place];  Service: General;  Laterality: Right;  . RADIOACTIVE SEED GUIDED MASTECTOMY WITH AXILLARY SENTINEL LYMPH NODE BIOPSY Right 01/08/2016   Procedure: RADIOACTIVE SEED LUMPECTOMY  WITH AXILLARY SENTINEL LYMPH NODE BIOPSY AND BLUE DYE INJECTION;  Surgeon: Rolm Bookbinder, MD;  Location: [place];  Service: General;  Laterality: Right;    There were no vitals filed for this visit.      Subjective Assessment - 02/28/16 0852    Subjective Im doing the nerve stretches throughout the day but it is still giving me a fit.    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 It measures 1.7 cm in the upper outer quadrant with a Ki67 of 95%. , Lumpectomy on 01/08/16 with lymph  node dissection   Patient Stated Goals to reduce my risk of lymphedema, go back to golfing   Currently in Pain? No/denies   Pain Score 0-No pain                         [facility] Adult PT Treatment/Exercise - 02/28/16 0001      Manual Therapy   Manual Therapy Myofascial release;Neural Stretch   Myofascial Release along cording from axilla to forearm performing short stretches along cording, also performed neural tension stretch to pt's tolerance in approx 80 degrees of abduction- pt with increased sensitivity to this   Passive ROM --   Neural Stretch With arm at 80 and 90 degrees abduction to pts tolerance                      Breast Clinic Goals - 07/24/15 1519      Patient will be able to verbalize understanding of pertinent lymphedema risk reduction practices relevant to her diagnosis specifically related to skin care.   Time 1   Period Days   Status Achieved     Patient will be able to return demonstrate and/or verbalize understanding of the post-op home exercise program related to regaining shoulder range of motion.   Time 1   Period Days   Status Achieved     Patient will be able to verbalize understanding of the  importance of attending the postoperative After Breast Cancer Class for further lymphedema risk reduction education and therapeutic exercise.   Time 1   Period Days   Status Achieved          Long Term Clinic Goals - 02/26/16 1626      CC Long Term Goal  #1   Title Pt to demonstrate 165 degrees of right shoulder flexion to allow her to reach items overhead   Baseline 123, 01/30/16- 150 degrees, 02/06/16- 156 degrees, 02/14/16- 165 degrees   Status Achieved     CC Long Term Goal  #2   Title Pt to demonstrate 160 degrees of right shoulder abduction to allow her to reach items out to sides   Baseline 94, 01/30/16- 120, 02/06/16- 150 degrees, 02/14/16- 162 degrees   Status Achieved     CC Long Term Goal  #3   Title Pt to be able to  independently verbalize lymphedema risk reduction practices   Baseline 01/30/16- pt able to independently verbalize all, 02/06/16- pt independent with verbalizing this   Status Achieved     CC Long Term Goal  #4   Title Patient to be independent in a home exercise program for continued strengthening and stretching   Status On-going     CC Long Term Goal  #5   Title Pt reports 50% improvement in scar tissue under right lumpectomy scar to improve comfort   Status On-going            Plan - 02/28/16 1210    Clinical Impression Statement Paitent demonstrates increased tightness in axilla through forearm today. She has noticable edema at forearm and a cord can be traced from her axilla to her forearm where this area of edema is located. Performed myofascial release today along cord to help stretch and decrease tightness. At the end of the session she demonstrated less tightness in her forearm.    Rehab Potential Excellent   PT Frequency 2x / week   PT Duration 4 weeks   PT Treatment/Interventions Therapeutic exercise;Patient/family education;ADLs/Self Care Home Management;Passive range of motion;Manual techniques;Orthotic Fit/Training   PT Next Visit Plan Cont 2x/wk for 4 wks focusing on myofascial release to Rt axilla and incision with neural tension stretching educating pt in same; also to progress HEP to include weights (start with 3 weight raises if not already doing?)   PT Home Exercise Plan Try Rt axillary myofascial release with UE in abduction with er. Cont red theraband strength and neural tension stretching but not while walking   Consulted and Agree with Plan of Care Patient      Patient will benefit from skilled therapeutic intervention in order to improve the following deficits and impairments:  Decreased strength, Decreased knowledge of precautions, Pain, Impaired UE functional use, Decreased range of motion, Decreased scar mobility  Visit Diagnosis: Stiffness of right  shoulder, not elsewhere classified  Acute pain of right shoulder  Abnormal posture     Problem List Patient Active Problem List   Diagnosis Date Noted  . Hypersensitivity reaction 10/04/2015  . Genetic testing 09/09/2015  . Breast cancer of upper-outer quadrant of right female breast ([place]) 07/15/2015  . Moderate persistent asthma 02/25/2015  . Allergic rhinitis due to pollen 02/25/2015  . HIP PAIN, BILATERAL 07/26/2008  . ECZEMA, ATOPIC DERMATITIS 05/27/2006    Alexia Freestone 02/28/2016, 12:14 PM  [place], [place], [address] Phone: [phone]   Fax:  [phone]  Name:  Courtney Gomez MRN: [id number] Date of Birth: 04/06/1965  Allyson Sabal, PT 02/28/16 12:14 PM

## 2016-02-28 NOTE — Progress Notes (Signed)
  Radiation Oncology         [phone]   Name: Courtney Gomez MRN: [id number]   Date: 02/28/2016  DOB: 1965/11/30   Weekly Radiation Therapy Management    ICD-9-CM ICD-10-CM   1. Malignant neoplasm of upper-outer quadrant of right breast in female, estrogen receptor positive (HCC) 174.4 C50.411    V86.0 Z17.0     Current Dose: 37.8 Gy  Planned Dose: 50.4 Gy  Narrative The patient presents for routine under treatment assessment.  The patient was seen on the treatment table. She denies pain except for nerve pain in her right arm. The patient denies pruritis or irritation in the treatment area.  Set-up films were reviewed. The chart was checked.  Physical Findings  vitals were not taken for this visit.. Weight essentially stable.  No significant changes. Diffuse erythema with no desquamation.  Impression The patient is tolerating radiation.   Plan Continue treatment as planned.         Sheral Apley Tammi Klippel, M.D.  This document serves as a record of services personally performed by Tyler Pita, MD. It was created on his behalf by Darcus Austin, a trained medical scribe. The creation of this record is based on the scribe's personal observations and the provider's statements to them. This document has been checked and approved by the attending provider.

## 2016-03-02 ENCOUNTER — Ambulatory Visit: Payer: 59 | Admitting: Physical Therapy

## 2016-03-02 ENCOUNTER — Ambulatory Visit (INDEPENDENT_AMBULATORY_CARE_PROVIDER_SITE_OTHER): Payer: 59 | Admitting: Allergy and Immunology

## 2016-03-02 ENCOUNTER — Ambulatory Visit: Payer: Commercial Managed Care - PPO | Admitting: Pediatrics

## 2016-03-02 ENCOUNTER — Encounter: Payer: Self-pay | Admitting: Allergy and Immunology

## 2016-03-02 ENCOUNTER — Ambulatory Visit
Admission: RE | Admit: 2016-03-02 | Discharge: 2016-03-02 | Disposition: A | Discharge status: Home / Self Care | Payer: 59 | Source: Ambulatory Visit | Attending: Radiation Oncology | Admitting: Radiation Oncology

## 2016-03-02 VITALS — BP 120/70 | HR 82 | Temp 97.9°F | Resp 14 | Ht 65.5 in | Wt 142.8 lb

## 2016-03-02 DIAGNOSIS — J301 Allergic rhinitis due to pollen: Secondary | ICD-10-CM

## 2016-03-02 DIAGNOSIS — J4540 Moderate persistent asthma, uncomplicated: Secondary | ICD-10-CM

## 2016-03-02 DIAGNOSIS — M25611 Stiffness of right shoulder, not elsewhere classified: Secondary | ICD-10-CM | POA: Diagnosis not present

## 2016-03-02 DIAGNOSIS — C50411 Malignant neoplasm of upper-outer quadrant of right female breast: Secondary | ICD-10-CM | POA: Diagnosis not present

## 2016-03-02 DIAGNOSIS — M25511 Pain in right shoulder: Secondary | ICD-10-CM

## 2016-03-02 MED ORDER — BUDESONIDE-FORMOTEROL FUMARATE 160-4.5 MCG/ACT IN AERO: 2.0000 | INHALATION_SPRAY | Freq: Two times a day (BID) | RESPIRATORY_TRACT | 5 refills | Status: DC

## 2016-03-02 NOTE — Assessment & Plan Note (Signed)
   A prescription has been provided for Symbicort (budesonide/formoterol) 160/4.5 g, 2 inhalations twice a day. To maximize pulmonary deposition, a spacer has been provided along with instructions for its proper administration with an HFA inhaler.  Discontinue Advair.  Continue montelukast 10 mg daily bedtime and albuterol HFA, 1-2 inhalations every 4-6 hours as needed.  Subjective and objective measures of pulmonary function will be followed and the treatment plan will be adjusted accordingly.

## 2016-03-02 NOTE — Therapy (Signed)
[place], [place], [address] Phone: [phone]   Fax:  [phone]  Physical Therapy Treatment  Patient Details  Name: Courtney Gomez MRN: [id number] Date of Birth: 20-Nov-1965 Referring Provider: Lindi Adie  Encounter Date: 03/02/2016      PT End of Session - 03/02/16 1700    Visit Number 11   Number of Visits 17   Date for PT Re-Evaluation 03/25/16   PT Start Time 1435   PT Stop Time 1517   PT Time Calculation (min) 42 min   Activity Tolerance Patient tolerated treatment well   Behavior During Therapy [facility] for tasks assessed/performed      Past Medical History:  Diagnosis Date  . Anxiety    not currently  . Asthma   . Breast cancer ([place])   . Breast cancer of upper-outer quadrant of right female breast ([place]) 07/15/2015  . Eczema   . History of motion sickness   . PONV (postoperative nausea and vomiting)     Past Surgical History:  Procedure Laterality Date  . DENTAL SURGERY  1982   impacted teeth  . PORTACATH PLACEMENT Right 07/31/2015   Procedure: INSERTION PORT-A-CATH WITH ULTRASOUND ;  Surgeon: Rolm Bookbinder, MD;  Location: [place];  Service: General;  Laterality: Right;  . RADIOACTIVE SEED GUIDED MASTECTOMY WITH AXILLARY SENTINEL LYMPH NODE BIOPSY Right 01/08/2016   Procedure: RADIOACTIVE SEED LUMPECTOMY  WITH AXILLARY SENTINEL LYMPH NODE BIOPSY AND BLUE DYE INJECTION;  Surgeon: Rolm Bookbinder, MD;  Location: [place];  Service: General;  Laterality: Right;    There were no vitals filed for this visit.      Subjective Assessment - 03/02/16 1438    Subjective "I think I've got about four cords in there."   Currently in Pain? Yes   Pain Score 4    Pain Location Arm   Pain Orientation Right;Anterior;Distal   Pain Descriptors / Indicators Sore   Aggravating Factors  extending elbow   Pain Relieving Factors not stretching the elbow                          [facility] Adult PT Treatment/Exercise - 03/02/16 0001      Manual Therapy   Manual Therapy Manual Lymphatic Drainage (MLD);Soft tissue mobilization   Soft tissue mobilization at cording in right axilla and proximal right upper arm today; palpable"pop" felt a couple of times.   Myofascial Release Right UE myofascial pulling with movement into abduction.   Manual Lymphatic Drainage (MLD) In supine, short neck, superficial and deep abdomen, left axilla and anterior interaxillary anastomosis, right groin and axillo-inguinal anastomosis, and right UE from dorsal hand to shoulder.   Passive ROM right shoulder P/ROM in abduction and flexion   Neural Stretch to right UE in supine                      Breast Clinic Goals - 07/24/15 1519      Patient will be able to verbalize understanding of pertinent lymphedema risk reduction practices relevant to her diagnosis specifically related to skin care.   Time 1   Period Days   Status Achieved     Patient will be able to return demonstrate and/or verbalize understanding of the post-op home exercise program related to regaining shoulder range of motion.   Time 1   Period Days   Status Achieved     Patient will be able to  verbalize understanding of the importance of attending the postoperative After Breast Cancer Class for further lymphedema risk reduction education and therapeutic exercise.   Time 1   Period Days   Status Achieved          Long Term Clinic Goals - 02/26/16 1626      CC Long Term Goal  #1   Title Pt to demonstrate 165 degrees of right shoulder flexion to allow her to reach items overhead   Baseline 123, 01/30/16- 150 degrees, 02/06/16- 156 degrees, 02/14/16- 165 degrees   Status Achieved     CC Long Term Goal  #2   Title Pt to demonstrate 160 degrees of right shoulder abduction to allow her to reach items out to sides   Baseline 94, 01/30/16- 120, 02/06/16- 150 degrees, 02/14/16-  162 degrees   Status Achieved     CC Long Term Goal  #3   Title Pt to be able to independently verbalize lymphedema risk reduction practices   Baseline 01/30/16- pt able to independently verbalize all, 02/06/16- pt independent with verbalizing this   Status Achieved     CC Long Term Goal  #4   Title Patient to be independent in a home exercise program for continued strengthening and stretching   Status On-going     CC Long Term Goal  #5   Title Pt reports 50% improvement in scar tissue under right lumpectomy scar to improve comfort   Status On-going            Plan - 03/02/16 1700    Clinical Impression Statement Patient with a fairly large diameter visible and palpable cord and right axilla and extending down her arm, with patient reporting she feels pulling down into the forearm.  Focus was on stretching this today; a couple of palpable pops were felt.  Patient did feel she could extend her elbow more fully at end of session without feeling the same pulling she had felt earlier.  Cording remained visible and palpable.  Manual lymph drainage was done to try to minimize puffiness at right anterior forearm that was visible today and had been noted by therapist on last visit.   Rehab Potential Excellent   Clinical Impairments Affecting Rehab Potential None   PT Frequency 2x / week   PT Duration 4 weeks   PT Treatment/Interventions Therapeutic exercise;Patient/family education;ADLs/Self Care Home Management;Passive range of motion;Manual techniques;Orthotic Fit/Training   PT Next Visit Plan Continue to focus on release of cording, and on manual lymph drainage prn.   PT Home Exercise Plan Continue stretching to try to release cording.   Consulted and Agree with Plan of Care Patient      Patient will benefit from skilled therapeutic intervention in order to improve the following deficits and impairments:  Decreased strength, Decreased knowledge of precautions, Pain, Impaired UE functional  use, Decreased range of motion, Decreased scar mobility  Visit Diagnosis: Stiffness of right shoulder, not elsewhere classified  Acute pain of right shoulder     Problem List Patient Active Problem List   Diagnosis Date Noted  . Hypersensitivity reaction 10/04/2015  . Genetic testing 09/09/2015  . Breast cancer of upper-outer quadrant of right female breast ([place]) 07/15/2015  . Moderate persistent asthma 02/25/2015  . Allergic rhinitis due to pollen 02/25/2015  . HIP PAIN, BILATERAL 07/26/2008  . ECZEMA, ATOPIC DERMATITIS 05/27/2006    Courtney Gomez 03/02/2016, 5:04 PM  [place], [place], [address] Phone: [phone]  Fax:  [phone]  Name: Courtney Gomez MRN: [id number] Date of Birth: 04-29-1965   Serafina Royals, PT 03/02/16 5:05 PM

## 2016-03-02 NOTE — Assessment & Plan Note (Signed)
   Continue appropriate allergen avoidance measures, over-the-counter antihistamine as needed, and Nasonex as needed.  I have also recommended nasal saline spray (i.e., Simply Saline) or nasal saline lavage (i.e., NeilMed) as needed prior to medicated nasal sprays.

## 2016-03-02 NOTE — Progress Notes (Signed)
Follow-up Note  RE: Courtney Gomez MRN: [id number] DOB: 11-23-1965 Date of Office Visit: 03/02/2016  Primary care provider: Paulo Fruit, MD Referring provider: Vania Rea, MD  History of present illness: Courtney Gomez is a 50 y.o. female with persistent asthma and allergic rhinitis presenting today for follow up.  She was last seen in this clinic in November 2016.  She had breast cancer this past year which has responded well to chemotherapy, she is currently finishing up radiation therapy.  Her asthma symptoms have been well-controlled with Advair 250/50 g, one inhalation daily, and montelukast 10 mg daily at bedtime.  She rarely requires albuterol rescue except when she is around friends' pets.  Advair is more expensive than she would like to pay and she is wondering if there are alternatives available.   Assessment and plan: Moderate persistent asthma  A prescription has been provided for Symbicort (budesonide/formoterol) 160/4.5 g,  2 inhalations twice a day. To maximize pulmonary deposition, a spacer has been provided along with instructions for its proper administration with an HFA inhaler.  Discontinue Advair.  Continue montelukast 10 mg daily bedtime and albuterol HFA, 1-2 inhalations every 4-6 hours as needed.  Subjective and objective measures of pulmonary function will be followed and the treatment plan will be adjusted accordingly.  Allergic rhinitis due to pollen  Continue appropriate allergen avoidance measures, over-the-counter antihistamine as needed, and Nasonex as needed.  I have also recommended nasal saline spray (i.e., Simply Saline) or nasal saline lavage (i.e., NeilMed) as needed prior to medicated nasal sprays.   Meds ordered this encounter  Medications  . budesonide-formoterol (SYMBICORT) 160-4.5 MCG/ACT inhaler    Sig: Inhale 2 puffs into the lungs 2 (two) times daily.    Dispense:  1 Inhaler    Refill:  5    Diagnostics: Spirometry  reveals an FVC of 3.14 L (93% predicted) and an FEV1 of 2.12 L (77% predicted) with an FEV1 ratio of 84%.  Mild airways obstruction.  Please see scanned spirometry results for details.    Physical examination: Blood pressure 120/70, pulse 82, temperature 97.9 F (36.6 C), temperature source Oral, resp. rate 14, height 5' 5.5" (1.664 m), weight 142 lb 12.8 oz (64.8 kg), SpO2 98 %.  General: Alert, interactive, in no acute distress. HEENT: TMs pearly gray, turbinates minimally edematous without discharge, post-pharynx mildly erythematous. Neck: Supple without lymphadenopathy. Lungs: Clear to auscultation without wheezing, rhonchi or rales. CV: Normal S1, S2 without murmurs. Skin: Warm and dry, without lesions or rashes.  The following portions of the patient's history were reviewed and updated as appropriate: allergies, current medications, past family history, past medical history, past social history, past surgical history and problem list.    Medication List       Accurate as of 03/02/16  4:38 PM. Always use your most recent med list.          ADVAIR DISKUS 250-50 MCG/DOSE Aepb Generic drug:  Fluticasone-Salmeterol Inhale 1 puff into the lungs daily.   budesonide-formoterol 160-4.5 MCG/ACT inhaler Commonly known as:  SYMBICORT Inhale 2 puffs into the lungs 2 (two) times daily.   cetirizine 10 MG tablet Commonly known as:  ZYRTEC Take 10 mg by mouth daily. Reported on 08/23/2015   fluticasone 50 MCG/ACT nasal spray Commonly known as:  FLONASE Place 1 spray into both nostrils daily.   hyaluronate sodium Gel Apply 1 application topically 2 (two) times daily.   LORazepam 0.5 MG tablet Commonly known as:  ATIVAN Take 1  tablet (0.5 mg total) by mouth at bedtime as needed.   montelukast 10 MG tablet Commonly known as:  SINGULAIR TAKE 1 TABLET (10 MG TOTAL) BY MOUTH AT BEDTIME.   non-metallic deodorant Misc Commonly known as:  ALRA Apply 1 application topically daily as  needed.   PROAIR HFA 108 (90 Base) MCG/ACT inhaler Generic drug:  albuterol Inhale 2 puffs into the lungs every 6 (six) hours as needed for wheezing or shortness of breath. Reported on 08/23/2015       Allergies  Allergen Reactions  . Lanolin Itching  . Latex Itching and Swelling    I appreciate the opportunity to take part in [place] care. Please do not hesitate to contact me with questions.  Sincerely,   R. Edgar Frisk, MD

## 2016-03-02 NOTE — Patient Instructions (Addendum)
Moderate persistent asthma  A prescription has been provided for Symbicort (budesonide/formoterol) 160/4.5 g,  2 inhalations twice a day. To maximize pulmonary deposition, a spacer has been provided along with instructions for its proper administration with an HFA inhaler.  Discontinue Advair.  Continue montelukast 10 mg daily bedtime and albuterol HFA, 1-2 inhalations every 4-6 hours as needed.  Subjective and objective measures of pulmonary function will be followed and the treatment plan will be adjusted accordingly.  Allergic rhinitis due to pollen  Continue appropriate allergen avoidance measures, over-the-counter antihistamine as needed, and Nasonex as needed.  I have also recommended nasal saline spray (i.e., Simply Saline) or nasal saline lavage (i.e., NeilMed) as needed prior to medicated nasal sprays.   Return in about 4 months (around 07/01/2016), or if symptoms worsen or fail to improve.

## 2016-03-03 ENCOUNTER — Ambulatory Visit
Admission: RE | Admit: 2016-03-03 | Discharge: 2016-03-03 | Disposition: A | Discharge status: Home / Self Care | Payer: 59 | Source: Ambulatory Visit | Attending: Radiation Oncology | Admitting: Radiation Oncology

## 2016-03-03 DIAGNOSIS — C50411 Malignant neoplasm of upper-outer quadrant of right female breast: Secondary | ICD-10-CM | POA: Diagnosis not present

## 2016-03-04 ENCOUNTER — Ambulatory Visit
Admission: RE | Admit: 2016-03-04 | Discharge: 2016-03-04 | Disposition: A | Discharge status: Home / Self Care | Payer: 59 | Source: Ambulatory Visit | Attending: Radiation Oncology | Admitting: Radiation Oncology

## 2016-03-04 DIAGNOSIS — C50411 Malignant neoplasm of upper-outer quadrant of right female breast: Secondary | ICD-10-CM | POA: Diagnosis not present

## 2016-03-05 ENCOUNTER — Ambulatory Visit: Payer: 59 | Admitting: Physical Therapy

## 2016-03-05 ENCOUNTER — Ambulatory Visit
Admission: RE | Admit: 2016-03-05 | Discharge: 2016-03-05 | Disposition: A | Discharge status: Home / Self Care | Payer: 59 | Source: Ambulatory Visit | Attending: Radiation Oncology | Admitting: Radiation Oncology

## 2016-03-05 DIAGNOSIS — C50411 Malignant neoplasm of upper-outer quadrant of right female breast: Secondary | ICD-10-CM | POA: Diagnosis not present

## 2016-03-06 ENCOUNTER — Ambulatory Visit
Admission: RE | Admit: 2016-03-06 | Discharge: 2016-03-06 | Disposition: A | Discharge status: Home / Self Care | Payer: 59 | Source: Ambulatory Visit | Attending: Radiation Oncology | Admitting: Radiation Oncology

## 2016-03-06 ENCOUNTER — Ambulatory Visit: Payer: 59

## 2016-03-06 VITALS — BP 137/91 | HR 79 | Resp 16 | Wt 141.6 lb

## 2016-03-06 DIAGNOSIS — Z170 Estrogen receptor positive status [ER+]: Secondary | ICD-10-CM

## 2016-03-06 DIAGNOSIS — C50411 Malignant neoplasm of upper-outer quadrant of right female breast: Secondary | ICD-10-CM

## 2016-03-06 NOTE — Progress Notes (Signed)
  Radiation Oncology         [phone]   Name: Courtney Gomez MRN: [id number]   Date: 03/06/2016  DOB: 28-Sep-1965   Weekly Radiation Therapy Management    ICD-9-CM ICD-10-CM   1. Malignant neoplasm of upper-outer quadrant of right breast in female, estrogen receptor positive (HCC) 174.4 C50.411    V86.0 Z17.0     Current Dose: 46.8 Gy  Planned Dose: 60.4 Gy  Narrative The patient presents for routine under treatment assessment.  Denies pain or fatigue. Right forearm edema noted related to the effects of axillary webbing. Patient continues PT twice a week. Hyperpigmentation without desquamation noted by the nurse within the treatment field. Reports using radiaplex and alra as directed. Reports she is having her port removed the day prior to her final xrt.   Set-up films were reviewed. The chart was checked.  Physical Findings  weight is 141 lb 9.6 oz (64.2 kg). Her blood pressure is 137/91 (abnormal) and her pulse is 79. Her respiration is 16 and oxygen saturation is 100%. . Weight essentially stable.  No significant changes. Diffuse erythema with no desquamation.  Impression The patient is tolerating radiation.   Plan Continue treatment as planned.         Sheral Apley Tammi Klippel, M.D.  This document serves as a record of services personally performed by Tyler Pita, MD. It was created on his behalf by Darcus Austin, a trained medical scribe. The creation of this record is based on the scribe's personal observations and the provider's statements to them. This document has been checked and approved by the attending provider.

## 2016-03-06 NOTE — Progress Notes (Signed)
Weight and vitals stable. Denies pain. 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 Patient continue PT twice a week. Hyperpigmentation without desquamation within treatment field. Reports using radiaplex and alra as directed. Denies fatigue. Reports she is having her port removed the day prior to her final xrt.   BP (!) 137/91 (BP Location: Left Arm, Patient Position: Sitting, Cuff Size: Normal)   Pulse 79   Resp 16   Wt 141 lb 9.6 oz (64.2 kg)   SpO2 100%   BMI 23.21 kg/m  Wt Readings from Last 3 Encounters:  03/06/16 141 lb 9.6 oz (64.2 kg)  03/02/16 142 lb 12.8 oz (64.8 kg)  02/19/16 139 lb 3.2 oz (63.1 kg)

## 2016-03-09 ENCOUNTER — Encounter: Payer: Self-pay | Admitting: Physical Therapy

## 2016-03-09 ENCOUNTER — Ambulatory Visit: Payer: 59 | Admitting: Physical Therapy

## 2016-03-09 ENCOUNTER — Ambulatory Visit
Admission: RE | Admit: 2016-03-09 | Discharge: 2016-03-09 | Disposition: A | Discharge status: Home / Self Care | Payer: 59 | Source: Ambulatory Visit | Attending: Radiation Oncology | Admitting: Radiation Oncology

## 2016-03-09 DIAGNOSIS — M25611 Stiffness of right shoulder, not elsewhere classified: Secondary | ICD-10-CM

## 2016-03-09 DIAGNOSIS — M25511 Pain in right shoulder: Secondary | ICD-10-CM

## 2016-03-09 DIAGNOSIS — C50411 Malignant neoplasm of upper-outer quadrant of right female breast: Secondary | ICD-10-CM | POA: Diagnosis not present

## 2016-03-09 NOTE — Therapy (Signed)
[place], [place], [address] Phone: [phone]   Fax:  [phone]  Physical Therapy Treatment  Patient Details  Name: Courtney Gomez MRN: [id number] Date of Birth: 04-09-1965 Referring Provider: Lindi Adie  Encounter Date: 03/09/2016      PT End of Session - 03/09/16 1655    Visit Number 12   Number of Visits 17   Date for PT Re-Evaluation 03/25/16   PT Start Time 1525   PT Stop Time 1605   PT Time Calculation (min) 40 min   Activity Tolerance Patient tolerated treatment well   Behavior During Therapy [facility] for tasks assessed/performed      Past Medical History:  Diagnosis Date  . Anxiety    not currently  . Asthma   . Breast cancer ([place])   . Breast cancer of upper-outer quadrant of right female breast ([place]) 07/15/2015  . Eczema   . History of motion sickness   . PONV (postoperative nausea and vomiting)     Past Surgical History:  Procedure Laterality Date  . DENTAL SURGERY  1982   impacted teeth  . PORTACATH PLACEMENT Right 07/31/2015   Procedure: INSERTION PORT-A-CATH WITH ULTRASOUND ;  Surgeon: Rolm Bookbinder, MD;  Location: [place];  Service: General;  Laterality: Right;  . RADIOACTIVE SEED GUIDED MASTECTOMY WITH AXILLARY SENTINEL LYMPH NODE BIOPSY Right 01/08/2016   Procedure: RADIOACTIVE SEED LUMPECTOMY  WITH AXILLARY SENTINEL LYMPH NODE BIOPSY AND BLUE DYE INJECTION;  Surgeon: Rolm Bookbinder, MD;  Location: [place];  Service: General;  Laterality: Right;    There were no vitals filed for this visit.      Subjective Assessment - 03/09/16 1527    Subjective I think my arm is hurting  a little less and I think the swelling is down a little.    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 It measures 1.7 cm in the upper outer quadrant with a Ki67 of 95%. , Lumpectomy on 01/08/16 with  lymph node dissection   Patient Stated Goals to reduce my risk of lymphedema, go back to golfing   Currently in Pain? No/denies   Pain Score 0-No pain                         [facility] Adult PT Treatment/Exercise - 03/09/16 0001      Manual Therapy   Manual Therapy Manual Lymphatic Drainage (MLD);Soft tissue mobilization   Soft tissue mobilization at cording in right axilla and proximal right upper arm today   Myofascial Release Right UE myofascial pulling with movement into abduction.   Manual Lymphatic Drainage (MLD) In supine, short neck, superficial abdomen, 5 diaphragmatic breaths, left axilla and anterior interaxillary anastomosis, right groin and axillo-inguinal anastomosis, and right UE from dorsal hand to shoulder.   Passive ROM right shoulder P/ROM in abduction and flexion                   Long Term Clinic Goals - 02/26/16 1626      CC Long Term Goal  #1   Title Pt to demonstrate 165 degrees of right shoulder flexion to allow her to reach items overhead   Baseline 123, 01/30/16- 150 degrees, 02/06/16- 156 degrees, 02/14/16- 165 degrees   Status Achieved     CC Long Term Goal  #2   Title Pt to demonstrate 160 degrees of right shoulder abduction  to allow her to reach items out to sides   Baseline 94, 01/30/16- 120, 02/06/16- 150 degrees, 02/14/16- 162 degrees   Status Achieved     CC Long Term Goal  #3   Title Pt to be able to independently verbalize lymphedema risk reduction practices   Baseline 01/30/16- pt able to independently verbalize all, 02/06/16- pt independent with verbalizing this   Status Achieved     CC Long Term Goal  #4   Title Patient to be independent in a home exercise program for continued strengthening and stretching   Status On-going     CC Long Term Goal  #5   Title Pt reports 50% improvement in scar tissue under right lumpectomy scar to improve comfort   Status On-going            Plan - 03/09/16 1655    Clinical  Impression Statement Patient's cording has decreased in diameter since last session and cords seem to be further spaced out. Patient is not feeling as much pulling in the forearm today. She also has decreased swelling in her forearm. Cording is still very visible and palpable especially in the axilla. MLD performed at right anterior forearm for management of minimal edema.    Rehab Potential Excellent   Clinical Impairments Affecting Rehab Potential None   PT Frequency 2x / week   PT Duration 4 weeks   PT Treatment/Interventions Therapeutic exercise;Patient/family education;ADLs/Self Care Home Management;Passive range of motion;Manual techniques;Orthotic Fit/Training   PT Next Visit Plan Continue to focus on release of cording, and on manual lymph drainage prn.   PT Home Exercise Plan Continue stretching to try to release cording.   Consulted and Agree with Plan of Care Patient      Patient will benefit from skilled therapeutic intervention in order to improve the following deficits and impairments:  Decreased strength, Decreased knowledge of precautions, Pain, Impaired UE functional use, Decreased range of motion, Decreased scar mobility  Visit Diagnosis: Stiffness of right shoulder, not elsewhere classified  Acute pain of right shoulder     Problem List Patient Active Problem List   Diagnosis Date Noted  . Hypersensitivity reaction 10/04/2015  . Genetic testing 09/09/2015  . Breast cancer of upper-outer quadrant of right female breast ([place]) 07/15/2015  . Moderate persistent asthma 02/25/2015  . Allergic rhinitis due to pollen 02/25/2015  . HIP PAIN, BILATERAL 07/26/2008  . ECZEMA, ATOPIC DERMATITIS 05/27/2006    Alexia Freestone 03/09/2016, 4:58 PM  [place], [place], [address] Phone: [phone]   Fax:  [phone]  Name: Courtney Gomez MRN: [id number] Date of Birth: 1965-06-06   Allyson Sabal, PT 03/09/16 4:58 PM

## 2016-03-10 ENCOUNTER — Ambulatory Visit
Admission: RE | Admit: 2016-03-10 | Discharge: 2016-03-10 | Disposition: A | Discharge status: Home / Self Care | Payer: 59 | Source: Ambulatory Visit | Attending: Radiation Oncology | Admitting: Radiation Oncology

## 2016-03-10 DIAGNOSIS — C50411 Malignant neoplasm of upper-outer quadrant of right female breast: Secondary | ICD-10-CM | POA: Diagnosis not present

## 2016-03-11 ENCOUNTER — Ambulatory Visit: Payer: 59

## 2016-03-11 ENCOUNTER — Other Ambulatory Visit: Payer: Self-pay | Admitting: General Surgery

## 2016-03-11 ENCOUNTER — Ambulatory Visit
Admission: RE | Admit: 2016-03-11 | Discharge: 2016-03-11 | Disposition: A | Discharge status: Home / Self Care | Payer: 59 | Source: Ambulatory Visit | Attending: Radiation Oncology | Admitting: Radiation Oncology

## 2016-03-11 DIAGNOSIS — C50411 Malignant neoplasm of upper-outer quadrant of right female breast: Secondary | ICD-10-CM | POA: Diagnosis not present

## 2016-03-12 ENCOUNTER — Ambulatory Visit: Payer: 59

## 2016-03-12 ENCOUNTER — Ambulatory Visit: Payer: 59 | Admitting: Physical Therapy

## 2016-03-12 ENCOUNTER — Ambulatory Visit
Admission: RE | Admit: 2016-03-12 | Discharge: 2016-03-12 | Disposition: A | Discharge status: Home / Self Care | Payer: 59 | Source: Ambulatory Visit | Attending: Radiation Oncology | Admitting: Radiation Oncology

## 2016-03-12 DIAGNOSIS — M25511 Pain in right shoulder: Secondary | ICD-10-CM

## 2016-03-12 DIAGNOSIS — M25611 Stiffness of right shoulder, not elsewhere classified: Secondary | ICD-10-CM | POA: Diagnosis not present

## 2016-03-12 DIAGNOSIS — C50411 Malignant neoplasm of upper-outer quadrant of right female breast: Secondary | ICD-10-CM | POA: Diagnosis not present

## 2016-03-12 NOTE — Therapy (Signed)
[place], [place], [address] Phone: [phone]   Fax:  [phone]  Physical Therapy Treatment  Patient Details  Name: Courtney Gomez MRN: [id number] Date of Birth: 10-07-1965 Referring Provider: Lindi Adie  Encounter Date: 03/12/2016      PT End of Session - 03/12/16 1628    Visit Number 13   Number of Visits 17   Date for PT Re-Evaluation 03/25/16   PT Start Time 1430   PT Stop Time 1515   PT Time Calculation (min) 45 min   Activity Tolerance Patient tolerated treatment well   Behavior During Therapy [facility] for tasks assessed/performed      Past Medical History:  Diagnosis Date  . Anxiety    not currently  . Asthma   . Breast cancer ([place])   . Breast cancer of upper-outer quadrant of right female breast ([place]) 07/15/2015  . Eczema   . History of motion sickness   . PONV (postoperative nausea and vomiting)     Past Surgical History:  Procedure Laterality Date  . DENTAL SURGERY  1982   impacted teeth  . PORTACATH PLACEMENT Right 07/31/2015   Procedure: INSERTION PORT-A-CATH WITH ULTRASOUND ;  Surgeon: Rolm Bookbinder, MD;  Location: [place];  Service: General;  Laterality: Right;  . RADIOACTIVE SEED GUIDED MASTECTOMY WITH AXILLARY SENTINEL LYMPH NODE BIOPSY Right 01/08/2016   Procedure: RADIOACTIVE SEED LUMPECTOMY  WITH AXILLARY SENTINEL LYMPH NODE BIOPSY AND BLUE DYE INJECTION;  Surgeon: Rolm Bookbinder, MD;  Location: [facility];  Service: General;  Laterality: Right;    There were no vitals filed for this visit.      Subjective Assessment - 03/12/16 1619    Subjective Pt is still undergoing radiation.  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 It measures  1.7 cm in the upper outer quadrant with a Ki67 of 95%. , Lumpectomy on 01/08/16 with lymph node dissection   Patient Stated Goals to reduce my risk of lymphedema, go back to golfing   Currently in Pain? No/denies            [facility] PT Assessment - 03/12/16 0001      Observation/Other Assessments   Observations bright pink erythema at right upper quadrant radiation site.      Palpation   Palpation comment guitar string cords palpated at right upper arm near [facility] Adult PT Treatment/Exercise - 03/12/16 0001      Self-Care   Self-Care Other Self-Care Comments   Other Self-Care Comments  provided tg soft to give support to cording of right arm , especially at upper arm Keep folded at the top      Manual Therapy   Manual Therapy Manual Lymphatic Drainage (MLD);Soft tissue mobilization   Soft tissue mobilization at cording in right axilla and proximal right upper arm today   Myofascial Release Right UE myofascial pulling with movement into abduction.   Passive ROM right shoulder P/ROM in abduction and flexion                PT Education - 03/12/16 1638    Education provided Yes   Education  Details wear TG soft for comfort only, remove if not comfortable or if not effective ,keep top of it folded, do not allow it to roll    Person(s) Educated Patient   Methods Explanation;Demonstration   Comprehension Verbalized understanding              Breast Clinic Goals - 07/24/15 1519      Patient will be able to verbalize understanding of pertinent lymphedema risk reduction practices relevant to her diagnosis specifically related to skin care.   Time 1   Period Days   Status Achieved     Patient will be able to return demonstrate and/or verbalize understanding of the post-op home exercise program related to regaining shoulder range of motion.   Time 1   Period Days   Status Achieved     Patient will be able to verbalize  understanding of the importance of attending the postoperative After Breast Cancer Class for further lymphedema risk reduction education and therapeutic exercise.   Time 1   Period Days   Status Achieved          Long Term Clinic Goals - 02/26/16 1626      CC Long Term Goal  #1   Title Pt to demonstrate 165 degrees of right shoulder flexion to allow her to reach items overhead   Baseline 123, 01/30/16- 150 degrees, 02/06/16- 156 degrees, 02/14/16- 165 degrees   Status Achieved     CC Long Term Goal  #2   Title Pt to demonstrate 160 degrees of right shoulder abduction to allow her to reach items out to sides   Baseline 94, 01/30/16- 120, 02/06/16- 150 degrees, 02/14/16- 162 degrees   Status Achieved     CC Long Term Goal  #3   Title Pt to be able to independently verbalize lymphedema risk reduction practices   Baseline 01/30/16- pt able to independently verbalize all, 02/06/16- pt independent with verbalizing this   Status Achieved     CC Long Term Goal  #4   Title Patient to be independent in a home exercise program for continued strengthening and stretching   Status On-going     CC Long Term Goal  #5   Title Pt reports 50% improvement in scar tissue under right lumpectomy scar to improve comfort   Status On-going            Plan - 03/12/16 1628    Clinical Impression Statement Pt contines with guitar string cording at right upper arm near axilla, but aggressive treatment was not performed today due to erythema in upper quadrant from radiation.  Provided tg soft for comfort only and to see if will help decrease cording    Rehab Potential Excellent   Clinical Impairments Affecting Rehab Potential None   PT Frequency 2x / week   PT Duration 4 weeks   PT Next Visit Plan Continue to focus on release of cording, and on manual lymph drainage prn.  Prior to discharge, teach Strength ABC program so she will know routine once recovered from radiation    PT Home Exercise Plan Continue  stretching to try to release cording.   Consulted and Agree with Plan of Care Patient      Patient will benefit from skilled therapeutic intervention in order to improve the following deficits and impairments:  Decreased strength, Decreased knowledge of precautions, Pain, Impaired UE functional use, Decreased range of motion, Decreased scar mobility  Visit Diagnosis: Stiffness of right shoulder, not elsewhere classified  Acute pain of right shoulder     Problem List Patient Active Problem List   Diagnosis Date Noted  . Hypersensitivity reaction 10/04/2015  . Genetic testing 09/09/2015  . Breast cancer of upper-outer quadrant of right female breast ([place]) 07/15/2015  . Moderate persistent asthma 02/25/2015  . Allergic rhinitis due to pollen 02/25/2015  . HIP PAIN, BILATERAL 07/26/2008  . ECZEMA, ATOPIC DERMATITIS 05/27/2006   Donato Heinz. Owens Shark PT  Norwood Levo 03/12/2016, 4:39 PM  [place], [place], [address] Phone: [phone]   Fax:  [phone]  Name: Courtney Gomez MRN: [id number] Date of Birth: 01-21-1966

## 2016-03-13 ENCOUNTER — Encounter: Payer: Self-pay | Admitting: Radiation Oncology

## 2016-03-13 ENCOUNTER — Ambulatory Visit
Admission: RE | Admit: 2016-03-13 | Discharge: 2016-03-13 | Disposition: A | Discharge status: Home / Self Care | Payer: 59 | Source: Ambulatory Visit | Attending: Radiation Oncology | Admitting: Radiation Oncology

## 2016-03-13 ENCOUNTER — Ambulatory Visit: Payer: 59

## 2016-03-13 ENCOUNTER — Encounter (HOSPITAL_COMMUNITY): Payer: Self-pay | Admitting: General Practice

## 2016-03-13 VITALS — BP 110/54 | HR 99 | Temp 98.9°F | Resp 12 | Wt 139.0 lb

## 2016-03-13 DIAGNOSIS — C50411 Malignant neoplasm of upper-outer quadrant of right female breast: Secondary | ICD-10-CM

## 2016-03-13 DIAGNOSIS — Z170 Estrogen receptor positive status [ER+]: Secondary | ICD-10-CM

## 2016-03-13 NOTE — Progress Notes (Signed)
Anesthesia follow-up: See Courtney Canter, RN and Courtney Lewis, RN notes. Patient with "head cold". Stayed home from work today. Temp 100.4 within last 24 hours. History of breast cancer. Patient for Port-a-cath removal Monday. She is not a smoker. Not prone to bronchitis. Does have asthma, but no recent albuterol use. No wheezing or chest congestion at present. Discussed with anesthesiologist Dr. Therisa Doyne. For this procedure could likely proceed if no worsening symptoms, high fever, or abnormal chest sounds. She should not require general anesthesia. Advised patient of this. Discussed that she can use her albuterol over the weekend and/or day of surgery if needed or lungs felt tight. She can call [phone] (Holding) over the weekend if any additional questions or changes in her status. Can go to urgent care if additional treatment or evaluation is needed. Anesthesiologist to evaluate on the day of surgery.   George Hugh [facility] Short Stay Center/Anesthesiology Phone [phone] 03/13/2016 2:43 PM

## 2016-03-13 NOTE — Progress Notes (Signed)
Dr. Therisa Doyne, Anesthesia made aware of pt symptoms. MD advised that as long as the " sinus cold" is not in pt chest it is okay. Spoke with Elmo Putt, CMA, to make aware of anesthesiologist response and to inquire about pt temperature since no contact was made with patient regarding actual temperature. Elmo Putt to follow up with patient; awaiting a return call.

## 2016-03-13 NOTE — Progress Notes (Signed)
  Radiation Oncology         [phone]   Name: Courtney Gomez MRN: [id number]   Date: 03/13/2016  DOB: 1965-04-21   Weekly Radiation Therapy Management        ICD-9-CM ICD-10-CM   1. Malignant neoplasm of upper-outer quadrant of right breast in female, estrogen receptor positive (HCC) 174.4 C50.411    V86.0 Z17.0     Current Dose: 56.4 Gy        Planned Dose: 60.4 Gy  Narrative The patient presents for routine under treatment assessment.  The patient reports a "head cold." The patient continues PT twice a week. She is scheduled for port removal on 03/16/16.  Set-up films were reviewed. The chart was checked.  Physical Findings  weight is 139 lb (63 kg). Her oral temperature is 98.9 F (37.2 C). Her blood pressure is 110/54 (abnormal) and her pulse is 99. Her respiration is 12 and oxygen saturation is 96%. . Weight essentially stable.  No significant changes. Diffuse erythema with no desquamation.  Impression The patient is tolerating radiation.   Plan Continue treatment as planned.         Sheral Apley Tammi Klippel, M.D.  This document serves as a record of services personally performed by Tyler Pita, MD. It was created on his behalf by Darcus Austin, a trained medical scribe. The creation of this record is based on the scribe's personal observations and the provider's statements to them. This document has been checked and approved by the attending provider.

## 2016-03-13 NOTE — Progress Notes (Signed)
Pre-op phone call completed.  Patient denies any cardiac history.    Patient states that she believes she has a sinus cold - denies cough but states she has felt as if she has had a fever.  Patient stated that she needed to recheck her temperature when she arrived back home with a new thermometer.  Patient informed nurse that she did have a call into [place] at Dr. Cristal Generous office.  Patient stated that she does not have a PCP and that when she gets sick she normally lets it run it's course.  Nurse spoke with Sunday Spillers at Dr. Cristal Generous office and relayed message.  Sunday Spillers stated that she was going to inform Dr. Donne Hazel and would be in touch with patient.

## 2016-03-13 NOTE — Progress Notes (Signed)
PAIN: She is currently in no pain.  SKIN: Pt right breast- positive for Dryness, Hyperpigmentation, Pruritus, erythema and dry desquamation.  Pt denies edema.  Pt continues to apply Radiaplex as directed. OTHER: Pt complains of a mild sinus headache. BP (!) 110/54   Pulse 99   Temp 98.9 F (37.2 C) (Oral)   Resp 12   Wt 139 lb (63 kg)   SpO2 96%   BMI 22.78 kg/m  Wt Readings from Last 3 Encounters:  03/13/16 139 lb (63 kg)  03/06/16 141 lb 9.6 oz (64.2 kg)  03/02/16 142 lb 12.8 oz (64.8 kg)

## 2016-03-16 ENCOUNTER — Encounter (HOSPITAL_COMMUNITY)
Admission: RE | Disposition: A | Discharge status: Home / Self Care | Payer: Self-pay | Source: Ambulatory Visit | Attending: General Surgery

## 2016-03-16 ENCOUNTER — Ambulatory Visit (HOSPITAL_COMMUNITY): Payer: 59 | Admitting: Vascular Surgery

## 2016-03-16 ENCOUNTER — Ambulatory Visit (HOSPITAL_COMMUNITY)
Admission: RE | Admit: 2016-03-16 | Discharge: 2016-03-16 | Disposition: A | Discharge status: Home / Self Care | Payer: 59 | Source: Ambulatory Visit | Attending: General Surgery | Admitting: General Surgery

## 2016-03-16 ENCOUNTER — Ambulatory Visit
Admission: RE | Admit: 2016-03-16 | Discharge: 2016-03-16 | Disposition: A | Discharge status: Home / Self Care | Payer: 59 | Source: Ambulatory Visit | Attending: Radiation Oncology | Admitting: Radiation Oncology

## 2016-03-16 DIAGNOSIS — Z452 Encounter for adjustment and management of vascular access device: Secondary | ICD-10-CM | POA: Insufficient documentation

## 2016-03-16 DIAGNOSIS — Z79899 Other long term (current) drug therapy: Secondary | ICD-10-CM | POA: Insufficient documentation

## 2016-03-16 DIAGNOSIS — Z9221 Personal history of antineoplastic chemotherapy: Secondary | ICD-10-CM | POA: Diagnosis not present

## 2016-03-16 DIAGNOSIS — C50411 Malignant neoplasm of upper-outer quadrant of right female breast: Secondary | ICD-10-CM | POA: Diagnosis not present

## 2016-03-16 DIAGNOSIS — J45909 Unspecified asthma, uncomplicated: Secondary | ICD-10-CM | POA: Insufficient documentation

## 2016-03-16 DIAGNOSIS — F419 Anxiety disorder, unspecified: Secondary | ICD-10-CM | POA: Insufficient documentation

## 2016-03-16 HISTORY — PX: PORT-A-CATH REMOVAL: SHX5289

## 2016-03-16 LAB — CBC
HCT: 40 % (ref 36.0–46.0)
Hemoglobin: 13.3 g/dL (ref 12.0–15.0)
MCH: 32.5 pg (ref 26.0–34.0)
MCHC: 33.3 g/dL (ref 30.0–36.0)
MCV: 97.8 fL (ref 78.0–100.0)
PLATELETS: 160 10*3/uL (ref 150–400)
RBC: 4.09 MIL/uL (ref 3.87–5.11)
RDW: 12.8 % (ref 11.5–15.5)
WBC: 3.9 10*3/uL — ABNORMAL LOW (ref 4.0–10.5)

## 2016-03-16 LAB — BASIC METABOLIC PANEL
Anion gap: 7 (ref 5–15)
BUN: 6 mg/dL (ref 6–20)
CALCIUM: 9.5 mg/dL (ref 8.9–10.3)
CO2: 28 mmol/L (ref 22–32)
CREATININE: 0.67 mg/dL (ref 0.44–1.00)
Chloride: 107 mmol/L (ref 101–111)
GFR calc Af Amer: >60 mL/min (ref >60)
Glucose, Bld: 101 mg/dL — ABNORMAL HIGH (ref 65–99)
POTASSIUM: 3.8 mmol/L (ref 3.5–5.1)
SODIUM: 142 mmol/L (ref 135–145)

## 2016-03-16 SURGERY — REMOVAL PORT-A-CATH
Anesthesia: Monitor Anesthesia Care | Site: Chest

## 2016-03-16 MED ORDER — ONDANSETRON HCL 4 MG/2ML IJ SOLN: 4.0000 mg | Freq: Once | INTRAMUSCULAR | Status: DC | PRN

## 2016-03-16 MED ORDER — OXYCODONE HCL 5 MG/5ML PO SOLN: 5.0000 mg | Freq: Once | ORAL | Status: DC | PRN

## 2016-03-16 MED ORDER — FENTANYL CITRATE (PF) 100 MCG/2ML IJ SOLN
INTRAMUSCULAR | Status: DC | PRN
  Administered 2016-03-16: 100 ug via INTRAVENOUS

## 2016-03-16 MED ORDER — 0.9 % SODIUM CHLORIDE (POUR BTL) OPTIME
TOPICAL | Status: DC | PRN
  Administered 2016-03-16: 1000 mL

## 2016-03-16 MED ORDER — FENTANYL CITRATE (PF) 100 MCG/2ML IJ SOLN: 25.0000 ug | INTRAMUSCULAR | Status: DC | PRN

## 2016-03-16 MED ORDER — PROPOFOL 500 MG/50ML IV EMUL
INTRAVENOUS | Status: DC | PRN
  Administered 2016-03-16: 75 ug/kg/min via INTRAVENOUS

## 2016-03-16 MED ORDER — PROPOFOL 10 MG/ML IV BOLUS
INTRAVENOUS | Status: AC
  Filled 2016-03-16: qty 20

## 2016-03-16 MED ORDER — OXYCODONE HCL 5 MG PO TABS: 5.0000 mg | ORAL_TABLET | Freq: Once | ORAL | Status: DC | PRN

## 2016-03-16 MED ORDER — PROPOFOL 10 MG/ML IV BOLUS
INTRAVENOUS | Status: DC | PRN
  Administered 2016-03-16: 60 mg via INTRAVENOUS

## 2016-03-16 MED ORDER — BUPIVACAINE-EPINEPHRINE 0.25% -1:200000 IJ SOLN
INTRAMUSCULAR | Status: DC | PRN
  Administered 2016-03-16: 8 mL

## 2016-03-16 MED ORDER — MIDAZOLAM HCL 5 MG/5ML IJ SOLN
INTRAMUSCULAR | Status: DC | PRN
  Administered 2016-03-16: 2 mg via INTRAVENOUS

## 2016-03-16 MED ORDER — PROPOFOL 500 MG/50ML IV EMUL
INTRAVENOUS | Status: AC
  Filled 2016-03-16: qty 100

## 2016-03-16 MED ORDER — OXYCODONE HCL 5 MG PO TABS: 5.0000 mg | ORAL_TABLET | ORAL | Status: DC | PRN

## 2016-03-16 MED ORDER — LIDOCAINE HCL (CARDIAC) 20 MG/ML IV SOLN
INTRAVENOUS | Status: DC | PRN
  Administered 2016-03-16: 60 mg via INTRAVENOUS

## 2016-03-16 MED ORDER — FENTANYL CITRATE (PF) 100 MCG/2ML IJ SOLN
INTRAMUSCULAR | Status: AC
  Filled 2016-03-16: qty 2

## 2016-03-16 MED ORDER — LACTATED RINGERS IV SOLN
INTRAVENOUS | Status: DC
  Administered 2016-03-16: 10:00:00 via INTRAVENOUS
  Administered 2016-03-16: 50 mL/h via INTRAVENOUS

## 2016-03-16 MED ORDER — BUPIVACAINE-EPINEPHRINE (PF) 0.25% -1:200000 IJ SOLN
INTRAMUSCULAR | Status: AC
  Filled 2016-03-16: qty 30

## 2016-03-16 MED ORDER — ONDANSETRON HCL 4 MG/2ML IJ SOLN
INTRAMUSCULAR | Status: AC
  Filled 2016-03-16: qty 2

## 2016-03-16 MED ORDER — MIDAZOLAM HCL 2 MG/2ML IJ SOLN
INTRAMUSCULAR | Status: AC
  Filled 2016-03-16: qty 2

## 2016-03-16 SURGICAL SUPPLY — 33 items
ADH SKN CLS APL DERMABOND .7 (GAUZE/BANDAGES/DRESSINGS) ×1
CHLORAPREP W/TINT 10.5 ML (MISCELLANEOUS) ×2 IMPLANT
COVER SURGICAL LIGHT HANDLE (MISCELLANEOUS) ×2 IMPLANT
DECANTER SPIKE VIAL GLASS SM (MISCELLANEOUS) ×2 IMPLANT
DERMABOND ADVANCED (GAUZE/BANDAGES/DRESSINGS) ×1
DERMABOND ADVANCED .7 DNX12 (GAUZE/BANDAGES/DRESSINGS) ×1 IMPLANT
DRAPE LAPAROTOMY 100X72 PEDS (DRAPES) ×2 IMPLANT
ELECT CAUTERY BLADE 6.4 (BLADE) ×2 IMPLANT
ELECT REM PT RETURN 9FT ADLT (ELECTROSURGICAL) ×2
ELECTRODE REM PT RTRN 9FT ADLT (ELECTROSURGICAL) ×1 IMPLANT
GAUZE SPONGE 4X4 16PLY XRAY LF (GAUZE/BANDAGES/DRESSINGS) ×2 IMPLANT
GLOVE BIO SURGEON STRL SZ7 (GLOVE) ×3 IMPLANT
GLOVE BIOGEL PI IND STRL 7.0 (GLOVE) IMPLANT
GLOVE BIOGEL PI IND STRL 7.5 (GLOVE) ×1 IMPLANT
GLOVE BIOGEL PI INDICATOR 7.0 (GLOVE) ×1
GLOVE BIOGEL PI INDICATOR 7.5 (GLOVE) ×1
GOWN STRL REUS W/ TWL LRG LVL3 (GOWN DISPOSABLE) ×2 IMPLANT
GOWN STRL REUS W/TWL LRG LVL3 (GOWN DISPOSABLE) ×4
KIT BASIN OR (CUSTOM PROCEDURE TRAY) ×2 IMPLANT
KIT ROOM TURNOVER OR (KITS) ×2 IMPLANT
NDL HYPO 25GX1X1/2 BEV (NEEDLE) ×1 IMPLANT
NEEDLE HYPO 25GX1X1/2 BEV (NEEDLE) ×2 IMPLANT
NS IRRIG 1000ML POUR BTL (IV SOLUTION) ×2 IMPLANT
PACK SURGICAL SETUP 50X90 (CUSTOM PROCEDURE TRAY) ×2 IMPLANT
PAD ARMBOARD 7.5X6 YLW CONV (MISCELLANEOUS) ×4 IMPLANT
PENCIL BUTTON HOLSTER BLD 10FT (ELECTRODE) ×2 IMPLANT
SUT MNCRL AB 4-0 PS2 18 (SUTURE) ×2 IMPLANT
SUT VIC AB 3-0 SH 27 (SUTURE) ×2
SUT VIC AB 3-0 SH 27X BRD (SUTURE) ×1 IMPLANT
SYR CONTROL 10ML LL (SYRINGE) ×2 IMPLANT
TOWEL OR 17X24 6PK STRL BLUE (TOWEL DISPOSABLE) ×2 IMPLANT
TOWEL OR 17X26 10 PK STRL BLUE (TOWEL DISPOSABLE) ×2 IMPLANT
WATER STERILE IRR 1000ML POUR (IV SOLUTION) IMPLANT

## 2016-03-16 NOTE — Anesthesia Postprocedure Evaluation (Signed)
Anesthesia Post Note  Patient: Courtney Gomez  Procedure(s) Performed: Procedure(s) (LRB): REMOVAL PORT-A-CATH (N/A)  Patient location during evaluation: PACU Anesthesia Type: MAC Level of consciousness: awake and alert Pain management: pain level controlled Vital Signs Assessment: post-procedure vital signs reviewed and stable Respiratory status: spontaneous breathing, nonlabored ventilation, respiratory function stable and patient connected to nasal cannula oxygen Cardiovascular status: stable and blood pressure returned to baseline Anesthetic complications: no    Last Vitals:  Vitals:   03/16/16 1103 03/16/16 1129  BP: (!) 110/59 (P) 111/69  Pulse: 91 (P) 81  Resp: 16 (P) 20  Temp:      Last Pain:  Vitals:   03/16/16 1102  TempSrc:   PainSc: 0-No pain                 Danyl Deems,W. EDMOND

## 2016-03-16 NOTE — Interval H&P Note (Signed)
History and Physical Interval Note:  03/16/2016 10:15 AM  Courtney Gomez  has presented today for surgery, with the diagnosis of RIGHT BREAST CANCER  The various methods of treatment have been discussed with the patient and family. After consideration of risks, benefits and other options for treatment, the patient has consented to  Procedure(s): REMOVAL PORT-A-CATH (N/A) as a surgical intervention .  The patient's history has been reviewed, patient examined, no change in status, stable for surgery.  I have reviewed the patient's chart and labs.  Questions were answered to the patient's satisfaction.     Tamyah Cutbirth

## 2016-03-16 NOTE — Anesthesia Postprocedure Evaluation (Signed)
Anesthesia Post Note  Patient: Courtney Gomez  Procedure(s) Performed: Procedure(s) (LRB): REMOVAL PORT-A-CATH (N/A)  Anesthesia Post Evaluation     Last Vitals:  Vitals:   03/16/16 1103 03/16/16 1129  BP: (!) 110/59 111/69  Pulse: 91 81  Resp: 16 20  Temp:      Last Pain:  Vitals:   03/16/16 1129  TempSrc:   PainSc: 0-No pain                 Jamarria Real,W. EDMOND

## 2016-03-16 NOTE — Discharge Instructions (Signed)
° ° °  POST OP INSTRUCTIONS  Always review your discharge instruction sheet given to you by the facility where your surgery was performed.   1. A prescription for pain medication may be given to you upon discharge. Take your pain medication as prescribed, if needed. If narcotic pain medicine is not needed, then you make take acetaminophen (Tylenol) or ibuprofen (Advil) as needed.  2. Take your usually prescribed medications unless otherwise directed. 3. If you need a refill on your pain medication, please contact our office. All narcotic pain medicine now requires a paper prescription.  Phoned in and fax refills are no longer allowed by law.  Prescriptions will not be filled after 5 pm or on weekends.  4. You should follow a light diet for the remainder of the day after your procedure. 5. Most patients will experience some mild swelling and/or bruising in the area of the incision. It may take several days to resolve. 6. It is common to experience some constipation if taking pain medication after surgery. Increasing fluid intake and taking a stool softener (such as Colace) will usually help or prevent this problem from occurring. A mild laxative (Milk of Magnesia or Miralax) should be taken according to package directions if there are no bowel movements after 48 hours.  7. Unless discharge instructions indicate otherwise, you may remove your bandages 48 hours after surgery, and you may shower at that time. You may have steri-strips (small white skin tapes) in place directly over the incision.  These strips should be left on the skin for 7-10 days.  If your surgeon used Dermabond (skin glue) on the incision, you may shower in 24 hours.  The glue will flake off over the next 2-3 weeks.  8. ACTIVITIES:  Limit activity involving your arms for the next 72 hours. Do no strenuous exercise or activity for 1 week. You may drive when you are no longer taking prescription pain medication, you can comfortably wear a  seatbelt, and you can maneuver your car. 10.You may need to see your doctor in the office for a follow-up appointment.  Please       check with your doctor.   WHEN TO CALL YOUR DOCTOR [phone]): 1. Fever over 101.0 2. Chills 3. Continued bleeding from incision 4. Increased redness and tenderness at the site 5. Shortness of breath, difficulty breathing   The clinic staff is available to answer your questions during regular business hours. Please dont hesitate to call and ask to speak to one of the nurses or medical assistants for clinical concerns. If you have a medical emergency, go to the nearest emergency room or call 911.  A surgeon from [facility] is always on call at the hospital.     For further information, please visit [email or web address]

## 2016-03-16 NOTE — H&P (Signed)
Courtney Gomez is an 50 y.o. female.   Chief Complaint: breast cancer HPI:  3 yof s/p primary chemotherapy who then underwent lump/sn with no residual cancer. she is doing well. she has seen pt. she is completing radiotherapy.  She no longer needs venous access and would like port removed  Past Medical History:  Diagnosis Date  . Anxiety    not currently  . Asthma   . Breast cancer ([place])   . Breast cancer of upper-outer quadrant of right female breast ([place]) 07/15/2015  . Eczema   . History of motion sickness   . PONV (postoperative nausea and vomiting)     Past Surgical History:  Procedure Laterality Date  . DENTAL SURGERY  1982   impacted teeth  . PORTACATH PLACEMENT Right 07/31/2015   Procedure: INSERTION PORT-A-CATH WITH ULTRASOUND ;  Surgeon: Rolm Bookbinder, MD;  Location: [place];  Service: General;  Laterality: Right;  . RADIOACTIVE SEED GUIDED MASTECTOMY WITH AXILLARY SENTINEL LYMPH NODE BIOPSY Right 01/08/2016   Procedure: RADIOACTIVE SEED LUMPECTOMY  WITH AXILLARY SENTINEL LYMPH NODE BIOPSY AND BLUE DYE INJECTION;  Surgeon: Rolm Bookbinder, MD;  Location: [place];  Service: General;  Laterality: Right;    Family History  Problem Relation Age of Onset  . Colon polyps Mother     hx of one large colon polyp s/p resection  . Other Mother     hx of benign teratoma and molar pregnancy  . Prostate cancer Father 61  . Colon polyps Father     possible colon polyp hx; +diverticulitis  . Diverticulitis Father   . Other Maternal Uncle     hx of unspecified "lump on his back"  . Lupus Paternal Aunt   . Heart attack Paternal Uncle     d. 41s; smoker  . Heart Problems Paternal Uncle   . Colon cancer Maternal Grandmother 63    possible colon cancer  . Lung cancer Maternal Grandfather 28    smoker; +EtOH  . COPD Paternal Grandmother   . Heart Problems Paternal Grandmother   . Heart Problems Paternal Grandfather   . Prostate cancer Paternal Grandfather      possible prostate cancer dx. early 55s  . Stomach cancer Other     maternal great grandmother (MGF's mother); dx. late 26s  . Colon cancer Other 57    paternal great grandmother (PGM's mother)  . Stomach cancer Other     paternal great uncles (PGF's brother) d. late 39s-early 32s   Social History:  reports that she has never smoked. She has never used smokeless tobacco. She reports that she drinks alcohol. She reports that she does not use drugs.  Allergies:  Allergies  Allergen Reactions  . Lanolin Itching  . Latex Itching and Swelling    Medications Prior to Admission  Medication Sig Dispense Refill  . albuterol (PROAIR HFA) 108 (90 BASE) MCG/ACT inhaler Inhale 2 puffs into the lungs every 6 (six) hours as needed for wheezing or shortness of breath. Reported on 08/23/2015    . budesonide-formoterol (SYMBICORT) 160-4.5 MCG/ACT inhaler Inhale 2 puffs into the lungs 2 (two) times daily. 1 Inhaler 5  . hyaluronate sodium (RADIAPLEXRX) GEL Apply 1 application topically 2 (two) times daily.    . montelukast (SINGULAIR) 10 MG tablet TAKE 1 TABLET (10 MG TOTAL) BY MOUTH AT BEDTIME. 30 tablet 3  . non-metallic deodorant (ALRA) MISC Apply 1 application topically daily as needed.    . cetirizine (ZYRTEC) 10 MG tablet Take  10 mg by mouth daily. Reported on 08/23/2015      No results found for this or any previous visit (from the past 48 hour(s)). No results found.  Review of Systems  Constitutional: Negative for fever.  All other systems reviewed and are negative.   Blood pressure 128/80, temperature 98.8 F (37.1 C), temperature source Oral, resp. rate 18, SpO2 97 %. Physical Exam   Weight: 141 lb Height: 66in Body Surface Area: 1.72 m Body Mass Index: 22.76 kg/m  Pulse: 93 (Regular)  BP: 136/82 (Sitting, Left Arm, Standard) Physical Exam  Breast Note: right axillary incision healing well without infection cv rrr pulm clear  AAssessment & Plan  POSTOPERATIVE  STATE [phone]) Story: released to full activity, discussed scar massage, she will call me after radiotherapy and we will remove port under sedation per her request  Rolm Bookbinder, MD 03/16/2016, 9:04 AM

## 2016-03-16 NOTE — Anesthesia Procedure Notes (Signed)
Procedure Name: MAC Date/Time: 03/16/2016 10:40 AM Performed by: Izora Gala Placement Confirmation: positive ETCO2

## 2016-03-16 NOTE — Op Note (Signed)
Preoperative diagnosis: Breast cancer no longer needs venous access Postoperative diagnosis: Same as above Procedure: Port removal Surgeon: Dr. Serita Grammes Anesthesia: IV sedation with local Consultations: None Drains: None Specimens: None Estimated blood loss: Minimal Sponge count correct at completion Disposition to recovery in stable condition  Indications: This is a 51 year old female who has undergone treatment for breast cancer. She no longer needs venous access. We discussed port removal.  Procedure: After informed consent was obtained the patient was taken to the operating room. She was placed under monitored anesthesia care. She was prepped and draped in the standard sterile surgical fashion. A surgical timeout was then performed.  I infiltrated the area of the incision and the port with Marcaine. I then reentered her old incision. The port and the line were removed in their entirety. There was no more foreign body present. Hemostasis was observed. I closed the incision with 3-0 Vicryl and 4-0 Monocryl. Glue was placed over that. She tolerated this well and was transferred to recovery stable.

## 2016-03-16 NOTE — Addendum Note (Signed)
Addendum  created 03/16/16 1316 by Roderic Palau, MD   Sign clinical note

## 2016-03-16 NOTE — Transfer of Care (Signed)
Immediate Anesthesia Transfer of Care Note  Patient: Courtney Gomez  Procedure(s) Performed: Procedure(s): REMOVAL PORT-A-CATH (N/A)  Patient Location: PACU  Anesthesia Type:MAC  Level of Consciousness: awake, alert , oriented and patient cooperative  Airway & Oxygen Therapy: Patient Spontanous Breathing and Patient connected to nasal cannula oxygen  Post-op Assessment: Report given to RN, Post -op Vital signs reviewed and stable, Patient moving all extremities and Patient moving all extremities X 4  Post vital signs: Reviewed and stable  Last Vitals:  Vitals:   03/16/16 1102 03/16/16 1103  BP:  (!) 110/59  Pulse:  91  Resp:  16  Temp: 36.7 C     Last Pain:  Vitals:   03/16/16 1102  TempSrc:   PainSc: 0-No pain      Patients Stated Pain Goal: 3 ([id number])  Complications: No apparent anesthesia complications

## 2016-03-16 NOTE — Anesthesia Preprocedure Evaluation (Signed)
Anesthesia Evaluation  Patient identified by MRN, date of birth, ID band Patient awake    Reviewed: Allergy & Precautions, NPO status , Patient's Chart, lab work & pertinent test results  Airway Mallampati: II  TM Distance: >3 FB Neck ROM: Full    Dental  (+) Teeth Intact, Dental Advisory Given   Pulmonary    breath sounds clear to auscultation       Cardiovascular  Rhythm:Regular Rate:Normal     Neuro/Psych    GI/Hepatic   Endo/Other    Renal/GU      Musculoskeletal   Abdominal   Peds  Hematology   Anesthesia Other Findings   Reproductive/Obstetrics                             Anesthesia Physical Anesthesia Plan  ASA: II  Anesthesia Plan: MAC   Post-op Pain Management:    Induction: Intravenous  Airway Management Planned: Natural Airway and Nasal Cannula  Additional Equipment:   Intra-op Plan:   Post-operative Plan:   Informed Consent: I have reviewed the patients History and Physical, chart, labs and discussed the procedure including the risks, benefits and alternatives for the proposed anesthesia with the patient or authorized representative who has indicated his/her understanding and acceptance.   Dental advisory given  Plan Discussed with: CRNA and Anesthesiologist  Anesthesia Plan Comments:         Anesthesia Quick Evaluation

## 2016-03-17 ENCOUNTER — Ambulatory Visit: Payer: 59

## 2016-03-17 ENCOUNTER — Encounter: Payer: Self-pay | Admitting: Radiation Oncology

## 2016-03-17 ENCOUNTER — Ambulatory Visit
Admission: RE | Admit: 2016-03-17 | Discharge: 2016-03-17 | Disposition: A | Discharge status: Home / Self Care | Payer: 59 | Source: Ambulatory Visit | Attending: Radiation Oncology | Admitting: Radiation Oncology

## 2016-03-17 ENCOUNTER — Ambulatory Visit: Payer: 59 | Admitting: Physical Therapy

## 2016-03-17 ENCOUNTER — Telehealth: Payer: Self-pay | Admitting: *Deleted

## 2016-03-17 ENCOUNTER — Encounter (HOSPITAL_COMMUNITY): Payer: Self-pay | Admitting: General Surgery

## 2016-03-17 DIAGNOSIS — C50411 Malignant neoplasm of upper-outer quadrant of right female breast: Secondary | ICD-10-CM | POA: Diagnosis not present

## 2016-03-17 DIAGNOSIS — M25611 Stiffness of right shoulder, not elsewhere classified: Secondary | ICD-10-CM

## 2016-03-17 DIAGNOSIS — M25511 Pain in right shoulder: Secondary | ICD-10-CM

## 2016-03-17 NOTE — Telephone Encounter (Signed)
  Oncology Nurse Navigator Documentation  Navigator Location: [facility] (03/17/16 1100)   )Navigator Encounter Type: Telephone (03/17/16 1100) Telephone: [place] Call (03/17/16 1100)                   Patient Visit Type: [id number] (03/17/16 1100) Treatment Phase: Final Radiation Tx (03/17/16 1100)                            Time Spent with Patient: 15 (03/17/16 1100)

## 2016-03-17 NOTE — Therapy (Signed)
[place], [place], [address] Phone: [phone]   Fax:  [phone]  Physical Therapy Treatment  Patient Details  Name: Courtney Gomez MRN: [id number] Date of Birth: Aug 21, 1965 Referring Provider: Lindi Adie  Encounter Date: 03/17/2016      PT End of Session - 03/17/16 1606    Visit Number 14   Number of Visits 17   Date for PT Re-Evaluation 03/25/16   PT Start Time 2836   PT Stop Time 1518   PT Time Calculation (min) 42 min   Activity Tolerance Patient tolerated treatment well   Behavior During Therapy [facility] for tasks assessed/performed      Past Medical History:  Diagnosis Date  . Anxiety    not currently  . Asthma   . Breast cancer ([place])   . Breast cancer of upper-outer quadrant of right female breast ([place]) 07/15/2015  . Eczema   . History of motion sickness   . PONV (postoperative nausea and vomiting)     Past Surgical History:  Procedure Laterality Date  . DENTAL SURGERY  1982   impacted teeth  . PORT-A-CATH REMOVAL N/A 03/16/2016   Procedure: REMOVAL PORT-A-CATH;  Surgeon: Rolm Bookbinder, MD;  Location: [place];  Service: General;  Laterality: N/A;  . PORTACATH PLACEMENT Right 07/31/2015   Procedure: INSERTION PORT-A-CATH WITH ULTRASOUND ;  Surgeon: Rolm Bookbinder, MD;  Location: [place];  Service: General;  Laterality: Right;  . RADIOACTIVE SEED GUIDED MASTECTOMY WITH AXILLARY SENTINEL LYMPH NODE BIOPSY Right 01/08/2016   Procedure: RADIOACTIVE SEED LUMPECTOMY  WITH AXILLARY SENTINEL LYMPH NODE BIOPSY AND BLUE DYE INJECTION;  Surgeon: Rolm Bookbinder, MD;  Location: [place];  Service: General;  Laterality: Right;    There were no vitals filed for this visit.      Subjective Assessment - 03/17/16 1437    Subjective The cording is still there. I have finished my radiation. Sometimes when I reach out it hurts but then others I am okay.    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 It measures 1.7 cm in the upper outer quadrant with a Ki67 of 95%. , Lumpectomy on 01/08/16 with lymph node dissection   Patient Stated Goals to reduce my risk of lymphedema, go back to golfing   Currently in Pain? No/denies   Pain Score 0-No pain                         [facility] Adult PT Treatment/Exercise - 03/17/16 0001      Manual Therapy   Manual Therapy Soft tissue mobilization;Myofascial release;Passive ROM   Soft tissue mobilization at cording in right axilla and proximal right upper arm today   Myofascial Release Right UE myofascial pulling with movement into abduction.   Passive ROM right shoulder P/ROM in abduction and flexion               Long Term Clinic Goals - 02/26/16 1626      CC Long Term Goal  #1   Title Pt to demonstrate 165 degrees of right shoulder flexion to allow her to reach items overhead   Baseline 123, 01/30/16- 150 degrees, 02/06/16- 156 degrees, 02/14/16- 165 degrees   Status Achieved     CC Long Term Goal  #2   Title Pt to demonstrate 160 degrees of right shoulder abduction to allow her to reach items out to sides  Baseline 94, 01/30/16- 120, 02/06/16- 150 degrees, 02/14/16- 162 degrees   Status Achieved     CC Long Term Goal  #3   Title Pt to be able to independently verbalize lymphedema risk reduction practices   Baseline 01/30/16- pt able to independently verbalize all, 02/06/16- pt independent with verbalizing this   Status Achieved     CC Long Term Goal  #4   Title Patient to be independent in a home exercise program for continued strengthening and stretching   Status On-going     CC Long Term Goal  #5   Title Pt reports 50% improvement in scar tissue under right lumpectomy scar to improve comfort   Status On-going            Plan - 03/17/16 1606    Clinical Impression Statement Pt presented with palpable and  visible cording at right upper arm near axilla. Myofascial release and soft tissue mobilization performed to cording and at end of session cording was less apparent and was less palpable. She still has some mild area of swelling an forearm but cording could not be palpated past her antecubital fossa today.    Rehab Potential Excellent   Clinical Impairments Affecting Rehab Potential None   PT Frequency 2x / week   PT Duration 4 weeks   PT Treatment/Interventions Therapeutic exercise;Patient/family education;ADLs/Self Care Home Management;Passive range of motion;Manual techniques;Orthotic Fit/Training   PT Next Visit Plan Continue to focus on release of cording, and on manual lymph drainage prn.  Prior to discharge, teach Strength ABC program so she will know routine once recovered from radiation    PT Home Exercise Plan Continue stretching to try to release cording.   Consulted and Agree with Plan of Care Patient      Patient will benefit from skilled therapeutic intervention in order to improve the following deficits and impairments:  Decreased strength, Decreased knowledge of precautions, Pain, Impaired UE functional use, Decreased range of motion, Decreased scar mobility  Visit Diagnosis: Stiffness of right shoulder, not elsewhere classified  Acute pain of right shoulder     Problem List Patient Active Problem List   Diagnosis Date Noted  . Hypersensitivity reaction 10/04/2015  . Genetic testing 09/09/2015  . Breast cancer of upper-outer quadrant of right female breast ([place]) 07/15/2015  . Moderate persistent asthma 02/25/2015  . Allergic rhinitis due to pollen 02/25/2015  . HIP PAIN, BILATERAL 07/26/2008  . ECZEMA, ATOPIC DERMATITIS 05/27/2006    Alexia Freestone 03/17/2016, 4:08 PM  [place], [place], [address] Phone: [phone]   Fax:  [phone]  Name: LADENA JACQUEZ MRN:  [id number] Date of Birth: [birth year]  Allyson Sabal, PT 03/17/16 4:08 PM

## 2016-03-18 NOTE — Progress Notes (Signed)
  Radiation Oncology         [phone]) [phone] ________________________________  Name: Courtney Gomez MRN: [id number]  Date: 03/17/2016  DOB: 08/30/1965  End of Treatment Note  Diagnosis:   50 yo woman with clinical stage T1c N0 M0 triple negative invasive ductal carcinoma of the UOQ right breast status post neoadjuvant chemotherapy with complete response.     Indication for treatment:  Curative       Radiation treatment dates:   01/30/2016 to 03/17/2016  Site/dose:    1. The right breast was treated to 50.4 Gy in 28 fractions at 1.8 Gy per fraction.  2. The right breast was boosted to 10 Gy in 5 fractions at 2 Gy per fraction.   Beams/energy:    1. 3D // 6X 2. Isodose plan // 12 MeV  Narrative: The patient tolerated radiation treatment relatively well.  Patient continues PT twice a week. She developed diffuse erythema with no desquamation in the treatment field.   Plan: The patient has completed radiation treatment. The patient will return to radiation oncology clinic for routine followup in one month. I advised her to call or return sooner if she has any questions or concerns related to her recovery or treatment. ________________________________  Sheral Apley. Tammi Klippel, M.D.   This document serves as a record of services personally performed by Tyler Pita, MD. It was created on his behalf by Arlyce Harman, a trained medical scribe. The creation of this record is based on the scribe's personal observations and the provider's statements to them. This document has been checked and approved by the attending provider.

## 2016-03-20 ENCOUNTER — Ambulatory Visit: Payer: 59 | Admitting: Physical Therapy

## 2016-03-20 DIAGNOSIS — M25611 Stiffness of right shoulder, not elsewhere classified: Secondary | ICD-10-CM

## 2016-03-20 DIAGNOSIS — M25511 Pain in right shoulder: Secondary | ICD-10-CM

## 2016-03-20 NOTE — Therapy (Signed)
Courtney Gomez, [place], [address] Phone: [phone]   Fax:  [phone]  Physical Therapy Treatment  Patient Details  Name: Courtney Gomez MRN: [id number] Date of Birth: 02-13-1966 Referring Provider: Lindi Adie  Encounter Date: 03/20/2016      PT End of Session - 03/20/16 0936    Visit Number 15   Number of Visits 17   Date for PT Re-Evaluation 03/25/16   PT Start Time 0850   PT Stop Time 0930   PT Time Calculation (min) 40 min   Activity Tolerance Patient tolerated treatment well   Behavior During Therapy [facility] for tasks assessed/performed      Past Medical History:  Diagnosis Date  . Anxiety    not currently  . Asthma   . Breast cancer ([place])   . Breast cancer of upper-outer quadrant of right female breast ([place]) 07/15/2015  . Eczema   . History of motion sickness   . PONV (postoperative nausea and vomiting)     Past Surgical History:  Procedure Laterality Date  . DENTAL SURGERY  1982   impacted teeth  . PORT-A-CATH REMOVAL N/A 03/16/2016   Procedure: REMOVAL PORT-A-CATH;  Surgeon: Rolm Bookbinder, MD;  Location: [place];  Service: General;  Laterality: N/A;  . PORTACATH PLACEMENT Right 07/31/2015   Procedure: INSERTION PORT-A-CATH WITH ULTRASOUND ;  Surgeon: Rolm Bookbinder, MD;  Location: [place];  Service: General;  Laterality: Right;  . RADIOACTIVE SEED GUIDED MASTECTOMY WITH AXILLARY SENTINEL LYMPH NODE BIOPSY Right 01/08/2016   Procedure: RADIOACTIVE SEED LUMPECTOMY  WITH AXILLARY SENTINEL LYMPH NODE BIOPSY AND BLUE DYE INJECTION;  Surgeon: Rolm Bookbinder, MD;  Location: [place];  Service: General;  Laterality: Right;    There were no vitals filed for this visit.      Subjective Assessment - 03/20/16 0852    Subjective Feels like therapy is helping and wonders about continuing.  Today is the last appointment she has scheduled. Had last radiation treatment on Monday  and got Portacath out as well.   Currently in Pain? Yes   Pain Score 4   with touching it   Pain Location Axilla   Pain Orientation Right   Aggravating Factors  radiation            [facility] PT Assessment - 03/20/16 0001      Observation/Other Assessments   Skin Integrity quite reddened at right axilla where boost of XRT was just completed                     [facility] Adult PT Treatment/Exercise - 03/20/16 0001      Manual Therapy   Soft tissue mobilization at cording in right axilla and proximal right upper arm, in both supine and left sidelying   Myofascial Release Right UE myofascial pulling with movement into abduction.   Passive ROM right shoulder P/ROM in abduction and flexion; also into horizontal abduction                      Breast Clinic Goals - 07/24/15 1519      Patient will be able to verbalize understanding of pertinent lymphedema risk reduction practices relevant to her diagnosis specifically related to skin care.   Time 1   Period Days   Status Achieved     Patient will be able to return demonstrate and/or verbalize understanding of the post-op home exercise program related to regaining shoulder range of motion.  Time 1   Period Days   Status Achieved     Patient will be able to verbalize understanding of the importance of attending the postoperative After Breast Cancer Class for further lymphedema risk reduction education and therapeutic exercise.   Time 1   Period Days   Status Achieved          Long Term Clinic Goals - 03/20/16 1211      CC Long Term Goal  #4   Title Patient to be independent in a home exercise program for continued strengthening and stretching   Status On-going     CC Long Term Goal  #5   Title Pt reports 50% improvement in scar tissue under right lumpectomy scar to improve comfort   Status On-going            Plan - 03/20/16 0936    Clinical Impression Statement Patient did well tolerating  manual stretching with focus on cording today.  She had some pain with stretching, but reported it was tolerable and did not aggravate the radiation burned skin today.  Therapist felt three mild pops of cording today, but very mild.   Rehab Potential Excellent   Clinical Impairments Affecting Rehab Potential None   PT Frequency 2x / week   PT Duration 4 weeks   PT Treatment/Interventions Therapeutic exercise;Patient/family education;ADLs/Self Care Home Management;Passive range of motion;Manual techniques;Orthotic Fit/Training   PT Next Visit Plan Patient will need renewal next visit. Continue to focus on release of cording, and on manual lymph drainage prn.  Prior to discharge, teach Strength ABC program so she will know routine once recovered from radiation    PT Home Exercise Plan Continue stretching to try to release cording.   Consulted and Agree with Plan of Care Patient      Patient will benefit from skilled therapeutic intervention in order to improve the following deficits and impairments:  Decreased strength, Decreased knowledge of precautions, Pain, Impaired UE functional use, Decreased range of motion, Decreased scar mobility  Visit Diagnosis: Stiffness of right shoulder, not elsewhere classified  Acute pain of right shoulder     Problem List Patient Active Problem List   Diagnosis Date Noted  . Hypersensitivity reaction 10/04/2015  . Genetic testing 09/09/2015  . Breast cancer of upper-outer quadrant of right female breast ([name]) 07/15/2015  . Moderate persistent asthma 02/25/2015  . Allergic rhinitis due to pollen 02/25/2015  . HIP PAIN, BILATERAL 07/26/2008  . ECZEMA, ATOPIC DERMATITIS 05/27/2006    Gomez,Courtney 03/20/2016, 12:13 PM  [place], [place], [address] Phone: [phone]   Fax:  [phone]  Name: Courtney Gomez MRN: [id number] Date of Birth: January 14, 1966   Serafina Royals, PT 03/20/16 12:13 PM

## 2016-03-25 ENCOUNTER — Ambulatory Visit: Payer: 59 | Admitting: Physical Therapy

## 2016-03-25 DIAGNOSIS — M25611 Stiffness of right shoulder, not elsewhere classified: Secondary | ICD-10-CM

## 2016-03-25 DIAGNOSIS — M25511 Pain in right shoulder: Secondary | ICD-10-CM

## 2016-03-25 NOTE — Therapy (Signed)
[place], [place], [address] Phone: [phone]   Fax:  [phone]  Physical Therapy Treatment  Patient Details  Name: Courtney Gomez MRN: [id number] Date of Birth: 15-Feb-1966 Referring Provider: Lindi Adie  Encounter Date: 03/25/2016      PT End of Session - 03/25/16 1159    Visit Number 16   Number of Visits 24   Date for PT Re-Evaluation 04/29/16   PT Start Time 1107   PT Stop Time 1155   PT Time Calculation (min) 48 min   Activity Tolerance Patient tolerated treatment well   Behavior During Therapy [facility] for tasks assessed/performed      Past Medical History:  Diagnosis Date  . Anxiety    not currently  . Asthma   . Breast cancer ([place])   . Breast cancer of upper-outer quadrant of right female breast ([place]) 07/15/2015  . Eczema   . History of motion sickness   . PONV (postoperative nausea and vomiting)     Past Surgical History:  Procedure Laterality Date  . DENTAL SURGERY  1982   impacted teeth  . PORT-A-CATH REMOVAL N/A 03/16/2016   Procedure: REMOVAL PORT-A-CATH;  Surgeon: Rolm Bookbinder, MD;  Location: [place];  Service: General;  Laterality: N/A;  . PORTACATH PLACEMENT Right 07/31/2015   Procedure: INSERTION PORT-A-CATH WITH ULTRASOUND ;  Surgeon: Rolm Bookbinder, MD;  Location: [place];  Service: General;  Laterality: Right;  . RADIOACTIVE SEED GUIDED MASTECTOMY WITH AXILLARY SENTINEL LYMPH NODE BIOPSY Right 01/08/2016   Procedure: RADIOACTIVE SEED LUMPECTOMY  WITH AXILLARY SENTINEL LYMPH NODE BIOPSY AND BLUE DYE INJECTION;  Surgeon: Rolm Bookbinder, MD;  Location: [place];  Service: General;  Laterality: Right;    There were no vitals filed for this visit.      Subjective Assessment - 03/25/16 1109    Subjective Moving the right arm now is more sore--radiation looks worse than it did last week.  Doesn't have the same discomfort at right forearm, but it feels  kind of thick there.   Currently in Pain? Yes   Pain Score 2    Pain Location Axilla   Pain Orientation Right   Pain Descriptors / Indicators --  "skin is hurting"   Aggravating Factors  stretching   Pain Relieving Factors getting it out of the stretch position            [facility] PT Assessment - 03/25/16 0001      Observation/Other Assessments   Skin Integrity still quite reddened at XRT boost area, but now it is peeling                     [facility] Adult PT Treatment/Exercise - 03/25/16 0001      Manual Therapy   Soft tissue mobilization at cording in right axilla and proximal right upper arm, in both supine and left sidelying; extended work trying to get cord to stretch and release with mobilization in different directions   Myofascial Release Right UE myofascial pulling with movement into abduction.   Passive ROM right shoulder P/ROM in abduction and flexion; also into horizontal abduction                      Breast Clinic Goals - 07/24/15 1519      Patient will be able to verbalize understanding of pertinent lymphedema risk reduction practices relevant to her diagnosis specifically related to skin care.   Time 1  Period Days   Status Achieved     Patient will be able to return demonstrate and/or verbalize understanding of the post-op home exercise program related to regaining shoulder range of motion.   Time 1   Period Days   Status Achieved     Patient will be able to verbalize understanding of the importance of attending the postoperative After Breast Cancer Class for further lymphedema risk reduction education and therapeutic exercise.   Time 1   Period Days   Status Achieved          Long Term Clinic Goals - 03/25/16 1205      CC Long Term Goal  #1   Title Pt to demonstrate 165 degrees of right shoulder flexion to allow her to reach items overhead   Status Achieved     CC Long Term Goal  #2   Title Pt to demonstrate 160 degrees  of right shoulder abduction to allow her to reach items out to sides   Status Achieved     CC Long Term Goal  #3   Title Pt to be able to independently verbalize lymphedema risk reduction practices   Status Achieved     CC Long Term Goal  #4   Title Patient to be independent in a home exercise program for continued strengthening and stretching   Status On-going     CC Long Term Goal  #5   Title Pt reports 50% improvement in scar tissue under right lumpectomy scar to improve comfort   Status On-going            Plan - 03/25/16 1201    Clinical Impression Statement Today focus was on limited area of cording at right upper arm and into axilla, trying to get it to release further without aggravating the radiation boost zone on upper outer breast.  I was able to avoid aggravating that spot; no pop of the cord was felt today, but it did seem to diminish during the course of treatment.  She appears to have some soft tissue that pulls tight with abduction of the right shoulder and the cording runs alongside that, but it became less prominent with manual work today.  The fascial/soft tissue tightness does run into the radiation boost area, so may need to focus on that once radiation skin irritation has healed.   Rehab Potential Poor   Clinical Impairments Affecting Rehab Potential recent radiation with skin irritation at upper outer breast on right   PT Frequency 2x / week   PT Duration 4 weeks   PT Treatment/Interventions Therapeutic exercise;Patient/family education;ADLs/Self Care Home Management;Passive range of motion;Manual techniques;Orthotic Fit/Training   PT Next Visit Plan (Renewal done today.) Continue to focus on release of cording, then release of tight fascia/soft tissue, and on manual lymph drainage prn.  Prior to discharge, teach Strength ABC program so she will know routine once recovered from radiation    PT Home Exercise Plan Continue stretching to try to release cording.    Consulted and Agree with Plan of Care Patient      Patient will benefit from skilled therapeutic intervention in order to improve the following deficits and impairments:  Decreased strength, Decreased knowledge of precautions, Pain, Impaired UE functional use, Decreased range of motion, Decreased scar mobility  Visit Diagnosis: Stiffness of right shoulder, not elsewhere classified - Plan: PT plan of care cert/re-cert  Acute pain of right shoulder - Plan: PT plan of care cert/re-cert     Problem List Patient  Active Problem List   Diagnosis Date Noted  . Hypersensitivity reaction 10/04/2015  . Genetic testing 09/09/2015  . Breast cancer of upper-outer quadrant of right female breast ([place]) 07/15/2015  . Moderate persistent asthma 02/25/2015  . Allergic rhinitis due to pollen 02/25/2015  . HIP PAIN, BILATERAL 07/26/2008  . ECZEMA, ATOPIC DERMATITIS 05/27/2006    Marbella Markgraf 03/25/2016, 12:09 PM  [place], [place], [address] Phone: [phone]   Fax:  [phone]  Name: SHERRESE SANANTONIO MRN: [id number] Date of Birth: [birth year]  Serafina Royals, PT 03/25/16 12:09 PM

## 2016-03-26 ENCOUNTER — Ambulatory Visit: Payer: 59 | Admitting: Physical Therapy

## 2016-03-26 DIAGNOSIS — M25511 Pain in right shoulder: Secondary | ICD-10-CM

## 2016-03-26 DIAGNOSIS — M25611 Stiffness of right shoulder, not elsewhere classified: Secondary | ICD-10-CM

## 2016-03-26 NOTE — Therapy (Signed)
[place], [place], [address] Phone: [phone]   Fax:  [phone]  Physical Therapy Treatment  Patient Details  Name: Courtney Gomez MRN: [id number] Date of Birth: 1965-04-28 Referring Provider: Lindi Adie  Encounter Date: 03/26/2016      PT End of Session - 03/26/16 1707    Visit Number 17   Number of Visits 24   Date for PT Re-Evaluation 04/29/16   PT Start Time [id number]   PT Stop Time 1655   PT Time Calculation (min) 45 min   Activity Tolerance Patient tolerated treatment well   Behavior During Therapy [facility] for tasks assessed/performed      Past Medical History:  Diagnosis Date  . Anxiety    not currently  . Asthma   . Breast cancer ([place])   . Breast cancer of upper-outer quadrant of right female breast ([place]) 07/15/2015  . Eczema   . History of motion sickness   . PONV (postoperative nausea and vomiting)     Past Surgical History:  Procedure Laterality Date  . DENTAL SURGERY  1982   impacted teeth  . PORT-A-CATH REMOVAL N/A 03/16/2016   Procedure: REMOVAL PORT-A-CATH;  Surgeon: Rolm Bookbinder, MD;  Location: [place];  Service: General;  Laterality: N/A;  . PORTACATH PLACEMENT Right 07/31/2015   Procedure: INSERTION PORT-A-CATH WITH ULTRASOUND ;  Surgeon: Rolm Bookbinder, MD;  Location: [place];  Service: General;  Laterality: Right;  . RADIOACTIVE SEED GUIDED MASTECTOMY WITH AXILLARY SENTINEL LYMPH NODE BIOPSY Right 01/08/2016   Procedure: RADIOACTIVE SEED LUMPECTOMY  WITH AXILLARY SENTINEL LYMPH NODE BIOPSY AND BLUE DYE INJECTION;  Surgeon: Rolm Bookbinder, MD;  Location: [place];  Service: General;  Laterality: Right;    There were no vitals filed for this visit.      Subjective Assessment - 03/26/16 1612    Subjective No particular extra soreness from yesterday.  Feels like there was a slight benefit in that she didn't jump from discomfort from reaching back with  the right arm.   Currently in Pain? No/denies  just skin from radiation                         [facility] Adult PT Treatment/Exercise - 03/26/16 0001      Manual Therapy   Soft tissue mobilization at cording in right axilla and proximal right upper arm, in both supine and left sidelying; extended work trying to get cord to stretch and release with mobilization in different directions   Myofascial Release Right UE myofascial pulling with movement into abduction.   Passive ROM right shoulder P/ROM in abduction and flexion; also into horizontal abduction                      Breast Clinic Goals - 07/24/15 1519      Patient will be able to verbalize understanding of pertinent lymphedema risk reduction practices relevant to her diagnosis specifically related to skin care.   Time 1   Period Days   Status Achieved     Patient will be able to return demonstrate and/or verbalize understanding of the post-op home exercise program related to regaining shoulder range of motion.   Time 1   Period Days   Status Achieved     Patient will be able to verbalize understanding of the importance of attending the postoperative After Breast Cancer Class for further lymphedema risk reduction education and therapeutic exercise.  Time 1   Period Days   Status Achieved          Long Term Clinic Goals - 03/25/16 1205      CC Long Term Goal  #1   Title Pt to demonstrate 165 degrees of right shoulder flexion to allow her to reach items overhead   Status Achieved     CC Long Term Goal  #2   Title Pt to demonstrate 160 degrees of right shoulder abduction to allow her to reach items out to sides   Status Achieved     CC Long Term Goal  #3   Title Pt to be able to independently verbalize lymphedema risk reduction practices   Status Achieved     CC Long Term Goal  #4   Title Patient to be independent in a home exercise program for continued strengthening and stretching    Status On-going     CC Long Term Goal  #5   Title Pt reports 50% improvement in scar tissue under right lumpectomy scar to improve comfort   Status On-going            Plan - 03/26/16 1707    Clinical Impression Statement More focus on cording today; able to palpate a couple of small releases during the course of today's session. No aggravation of radiation boost spot today.   Rehab Potential Excellent   Clinical Impairments Affecting Rehab Potential recent radiation with skin irritation at upper outer breast on right   PT Frequency 2x / week   PT Duration 4 weeks   PT Treatment/Interventions Therapeutic exercise;Patient/family education;ADLs/Self Care Home Management;Passive range of motion;Manual techniques;Orthotic Fit/Training   PT Next Visit Plan Continue to focus on release of cording, then release of tight fascia/soft tissue, and on manual lymph drainage prn.  Prior to discharge, teach Strength ABC program so she will know routine once recovered from radiation    PT Home Exercise Plan Continue stretching to try to release cording.   Consulted and Agree with Plan of Care Patient      Patient will benefit from skilled therapeutic intervention in order to improve the following deficits and impairments:  Decreased strength, Decreased knowledge of precautions, Pain, Impaired UE functional use, Decreased range of motion, Decreased scar mobility  Visit Diagnosis: Stiffness of right shoulder, not elsewhere classified  Acute pain of right shoulder     Problem List Patient Active Problem List   Diagnosis Date Noted  . Hypersensitivity reaction 10/04/2015  . Genetic testing 09/09/2015  . Breast cancer of upper-outer quadrant of right female breast ([place]) 07/15/2015  . Moderate persistent asthma 02/25/2015  . Allergic rhinitis due to pollen 02/25/2015  . HIP PAIN, BILATERAL 07/26/2008  . ECZEMA, ATOPIC DERMATITIS 05/27/2006    Courtney Gomez,Courtney Gomez 03/26/2016, 5:10 PM  [place], [place], [address] Phone: [phone]   Fax:  [phone]  Name: Courtney Gomez MRN: [id number] Date of Birth: Oct 19, 1965  Courtney Gomez, PT 03/26/16 5:10 PM

## 2016-04-01 ENCOUNTER — Ambulatory Visit: Payer: BLUE CROSS/BLUE SHIELD | Attending: Hematology and Oncology | Admitting: Physical Therapy

## 2016-04-01 DIAGNOSIS — M25611 Stiffness of right shoulder, not elsewhere classified: Secondary | ICD-10-CM | POA: Diagnosis present

## 2016-04-01 DIAGNOSIS — R293 Abnormal posture: Secondary | ICD-10-CM | POA: Diagnosis present

## 2016-04-01 DIAGNOSIS — M25511 Pain in right shoulder: Secondary | ICD-10-CM | POA: Diagnosis present

## 2016-04-01 NOTE — Therapy (Signed)
[place], [place], [address] Phone: [phone]   Fax:  [phone]  Physical Therapy Treatment  Patient Details  Name: Courtney Gomez MRN: [id number] Date of Birth: [birth year] Referring Provider: Lindi Adie  Encounter Date: 04/01/2016      PT End of Session - 04/01/16 1707    Visit Number 18   Number of Visits 24   Date for PT Re-Evaluation 04/29/16   PT Start Time [id number]   PT Stop Time 1655   PT Time Calculation (min) 45 min   Activity Tolerance Patient tolerated treatment well   Behavior During Therapy [facility] for tasks assessed/performed      Past Medical History:  Diagnosis Date  . Anxiety    not currently  . Asthma   . Breast cancer ([place])   . Breast cancer of upper-outer quadrant of right female breast ([place]) 07/15/2015  . Eczema   . History of motion sickness   . PONV (postoperative nausea and vomiting)     Past Surgical History:  Procedure Laterality Date  . DENTAL SURGERY  1982   impacted teeth  . PORT-A-CATH REMOVAL N/A 03/16/2016   Procedure: REMOVAL PORT-A-CATH;  Surgeon: Rolm Bookbinder, MD;  Location: [place];  Service: General;  Laterality: N/A;  . PORTACATH PLACEMENT Right 07/31/2015   Procedure: INSERTION PORT-A-CATH WITH ULTRASOUND ;  Surgeon: Rolm Bookbinder, MD;  Location: [place];  Service: General;  Laterality: Right;  . RADIOACTIVE SEED GUIDED MASTECTOMY WITH AXILLARY SENTINEL LYMPH NODE BIOPSY Right 01/08/2016   Procedure: RADIOACTIVE SEED LUMPECTOMY  WITH AXILLARY SENTINEL LYMPH NODE BIOPSY AND BLUE DYE INJECTION;  Surgeon: Rolm Bookbinder, MD;  Location: [place];  Service: General;  Laterality: Right;    There were no vitals filed for this visit.      Subjective Assessment - 04/01/16 1612    Subjective "I think it's getting better--I could reach back into my car's back seat and I realized it didn't hurt."   Currently in Pain? No/denies  "not unless  I stretch"                         [facility] Adult PT Treatment/Exercise - 04/01/16 0001      Manual Therapy   Manual Therapy Scapular mobilization   Soft tissue mobilization at cording in right axilla and proximal right upper arm, in both supine and left sidelying; extended work trying to get cord to stretch and release with mobilization in different directions   Myofascial Release Right UE myofascial pulling with movement into abduction., supine and into sidelying; distraction at right arm into scaption with opposite hand at abdomen in diagonal and vertical directions   Scapular Mobilization to right scapula in left sidelying   Passive ROM right shoulder P/ROM in abduction and flexion; also into horizontal abduction                      Breast Clinic Goals - 07/24/15 1519      Patient will be able to verbalize understanding of pertinent lymphedema risk reduction practices relevant to her diagnosis specifically related to skin care.   Time 1   Period Days   Status Achieved     Patient will be able to return demonstrate and/or verbalize understanding of the post-op home exercise program related to regaining shoulder range of motion.   Time 1   Period Days   Status Achieved     Patient  will be able to verbalize understanding of the importance of attending the postoperative After Breast Cancer Class for further lymphedema risk reduction education and therapeutic exercise.   Time 1   Period Days   Status Achieved          Long Term Clinic Goals - 03/25/16 1205      CC Long Term Goal  #1   Title Pt to demonstrate 165 degrees of right shoulder flexion to allow her to reach items overhead   Status Achieved     CC Long Term Goal  #2   Title Pt to demonstrate 160 degrees of right shoulder abduction to allow her to reach items out to sides   Status Achieved     CC Long Term Goal  #3   Title Pt to be able to independently verbalize lymphedema risk  reduction practices   Status Achieved     CC Long Term Goal  #4   Title Patient to be independent in a home exercise program for continued strengthening and stretching   Status On-going     CC Long Term Goal  #5   Title Pt reports 50% improvement in scar tissue under right lumpectomy scar to improve comfort   Status On-going            Plan - 04/01/16 1708    Clinical Impression Statement Pt. does well tolerating vigorous stretching and manual work to try to release cording.  Therapist felt several small releases today.  Pt. reports noting improvement in her ADLs with decreased discomfort with reaching, such as reaching back into the back seat of her car.  Radiation boost area is healing so that it is less sensitive than it was; patient notes firmness of texture of the breast there.   Rehab Potential Excellent   Clinical Impairments Affecting Rehab Potential recent radiation with skin irritation at upper outer breast on right   PT Frequency 2x / week   PT Duration 4 weeks   PT Treatment/Interventions Therapeutic exercise;Patient/family education;ADLs/Self Care Home Management;Passive range of motion;Manual techniques;Orthotic Fit/Training   PT Next Visit Plan Continue to focus on release of cording, then release of tight fascia/soft tissue, and on manual lymph drainage prn.  Prior to discharge, teach Strength ABC program so she will know routine once recovered from radiation    PT Home Exercise Plan Continue stretching to try to release cording.   Consulted and Agree with Plan of Care Patient      Patient will benefit from skilled therapeutic intervention in order to improve the following deficits and impairments:  Decreased strength, Decreased knowledge of precautions, Pain, Impaired UE functional use, Decreased range of motion, Decreased scar mobility  Visit Diagnosis: Stiffness of right shoulder, not elsewhere classified  Acute pain of right shoulder     Problem  List Patient Active Problem List   Diagnosis Date Noted  . Hypersensitivity reaction 10/04/2015  . Genetic testing 09/09/2015  . Breast cancer of upper-outer quadrant of right female breast ([place]) 07/15/2015  . Moderate persistent asthma 02/25/2015  . Allergic rhinitis due to pollen 02/25/2015  . HIP PAIN, BILATERAL 07/26/2008  . ECZEMA, ATOPIC DERMATITIS 05/27/2006    [place],DONNA 04/01/2016, 5:13 PM  [place], [place], [address] Phone: [phone]   Fax:  [phone]  Name: Courtney Gomez MRN: [id number] Date of Birth: [birth year]  Serafina Royals, PT 04/01/16 5:13 PM

## 2016-04-03 ENCOUNTER — Other Ambulatory Visit: Payer: Self-pay | Admitting: Pediatrics

## 2016-04-03 ENCOUNTER — Ambulatory Visit: Payer: BLUE CROSS/BLUE SHIELD | Admitting: Physical Therapy

## 2016-04-03 DIAGNOSIS — M25611 Stiffness of right shoulder, not elsewhere classified: Secondary | ICD-10-CM | POA: Diagnosis not present

## 2016-04-03 DIAGNOSIS — M25511 Pain in right shoulder: Secondary | ICD-10-CM

## 2016-04-03 NOTE — Therapy (Signed)
[place], [place], [address] Phone: [phone]   Fax:  [phone]  Physical Therapy Treatment  Patient Details  Name: Courtney Gomez MRN: [id number] Date of Birth: 1966-02-07 Referring Provider: Lindi Adie  Encounter Date: 04/03/2016      PT End of Session - 04/03/16 1217    Visit Number 19   Number of Visits 24   Date for PT Re-Evaluation 04/29/16   PT Start Time 0814  patient late today due to her mixing up appt. time   PT Stop Time 0848   PT Time Calculation (min) 34 min   Activity Tolerance Patient tolerated treatment well   Behavior During Therapy [facility] for tasks assessed/performed      Past Medical History:  Diagnosis Date  . Anxiety    not currently  . Asthma   . Breast cancer ([place])   . Breast cancer of upper-outer quadrant of right female breast ([place]) 07/15/2015  . Eczema   . History of motion sickness   . PONV (postoperative nausea and vomiting)     Past Surgical History:  Procedure Laterality Date  . DENTAL SURGERY  1982   impacted teeth  . PORT-A-CATH REMOVAL N/A 03/16/2016   Procedure: REMOVAL PORT-A-CATH;  Surgeon: Rolm Bookbinder, MD;  Location: [place];  Service: General;  Laterality: N/A;  . PORTACATH PLACEMENT Right 07/31/2015   Procedure: INSERTION PORT-A-CATH WITH ULTRASOUND ;  Surgeon: Rolm Bookbinder, MD;  Location: [place];  Service: General;  Laterality: Right;  . RADIOACTIVE SEED GUIDED MASTECTOMY WITH AXILLARY SENTINEL LYMPH NODE BIOPSY Right 01/08/2016   Procedure: RADIOACTIVE SEED LUMPECTOMY  WITH AXILLARY SENTINEL LYMPH NODE BIOPSY AND BLUE DYE INJECTION;  Surgeon: Rolm Bookbinder, MD;  Location: [place];  Service: General;  Laterality: Right;    There were no vitals filed for this visit.      Subjective Assessment - 04/03/16 0815    Subjective Nothing new today.   Currently in Pain? No/denies            [facility] PT Assessment -  04/03/16 0001      AROM   Right Shoulder Flexion 150 Degrees   Right Shoulder ABduction 172 Degrees                     [facility] Adult PT Treatment/Exercise - 04/03/16 0001      Manual Therapy   Soft tissue mobilization at cording in right axilla and proximal right upper arm, in supine; extended work trying to get cord to stretch and release with mobilization in different directions   Myofascial Release right UE myofascial pulling; right UE distraction with concurrent diagonal distraction at left abdomen (in diagonal); crosshands at right axilla; release at right upper arm with arm in abduction, working to release scar area   Passive ROM right shoulder P/ROM in abduction and flexion                      Breast Clinic Goals - 07/24/15 1519      Patient will be able to verbalize understanding of pertinent lymphedema risk reduction practices relevant to her diagnosis specifically related to skin care.   Time 1   Period Days   Status Achieved     Patient will be able to return demonstrate and/or verbalize understanding of the post-op home exercise program related to regaining shoulder range of motion.   Time 1   Period Days   Status Achieved  Patient will be able to verbalize understanding of the importance of attending the postoperative After Breast Cancer Class for further lymphedema risk reduction education and therapeutic exercise.   Time 1   Period Days   Status Achieved          Long Term Clinic Goals - 03/25/16 1205      CC Long Term Goal  #1   Title Pt to demonstrate 165 degrees of right shoulder flexion to allow her to reach items overhead   Status Achieved     CC Long Term Goal  #2   Title Pt to demonstrate 160 degrees of right shoulder abduction to allow her to reach items out to sides   Status Achieved     CC Long Term Goal  #3   Title Pt to be able to independently verbalize lymphedema risk reduction practices   Status Achieved      CC Long Term Goal  #4   Title Patient to be independent in a home exercise program for continued strengthening and stretching   Status On-going     CC Long Term Goal  #5   Title Pt reports 50% improvement in scar tissue under right lumpectomy scar to improve comfort   Status On-going            Plan - 04/03/16 1218    Clinical Impression Statement Very large gain in right shoulder abduction actively compared to last measurement; active flexion remains about the same.  Pt. still benefitting from manual work to improve flexibility.   Rehab Potential Excellent   Clinical Impairments Affecting Rehab Potential recent radiation with skin irritation at upper outer breast on right   PT Frequency 2x / week   PT Duration 4 weeks   PT Treatment/Interventions Therapeutic exercise;Patient/family education;ADLs/Self Care Home Management;Passive range of motion;Manual techniques;Orthotic Fit/Training   PT Next Visit Plan Continue to focus on release of cording, then release of tight fascia/soft tissue, and on manual lymph drainage prn.  Prior to discharge, teach Strength ABC program so she will know routine once recovered from radiation    Consulted and Agree with Plan of Care Patient      Patient will benefit from skilled therapeutic intervention in order to improve the following deficits and impairments:  Decreased strength, Decreased knowledge of precautions, Pain, Impaired UE functional use, Decreased range of motion, Decreased scar mobility  Visit Diagnosis: Stiffness of right shoulder, not elsewhere classified  Acute pain of right shoulder     Problem List Patient Active Problem List   Diagnosis Date Noted  . Hypersensitivity reaction 10/04/2015  . Genetic testing 09/09/2015  . Breast cancer of upper-outer quadrant of right female breast ([place]) 07/15/2015  . Moderate persistent asthma 02/25/2015  . Allergic rhinitis due to pollen 02/25/2015  . HIP PAIN, BILATERAL 07/26/2008  .  ECZEMA, ATOPIC DERMATITIS 05/27/2006    Zyan Coby 04/03/2016, 12:20 PM  [place], [place], [address] Phone: [phone]   Fax:  [phone]  Name: Courtney Gomez MRN: [id number] Date of Birth: 1965/05/31   Serafina Royals, PT 04/03/16 12:20 PM

## 2016-04-07 ENCOUNTER — Ambulatory Visit: Payer: BLUE CROSS/BLUE SHIELD

## 2016-04-07 DIAGNOSIS — M25611 Stiffness of right shoulder, not elsewhere classified: Secondary | ICD-10-CM

## 2016-04-07 DIAGNOSIS — R293 Abnormal posture: Secondary | ICD-10-CM

## 2016-04-07 DIAGNOSIS — M25511 Pain in right shoulder: Secondary | ICD-10-CM

## 2016-04-07 NOTE — Therapy (Signed)
[place], [place], [address] Phone: [phone]   Fax:  [phone]  Physical Therapy Treatment  Patient Details  Name: Courtney Gomez MRN: [id number] Date of Birth: 1966-03-30 Referring Provider: Lindi Adie  Encounter Date: 04/07/2016      PT End of Session - 04/07/16 0931    Visit Number 20   Number of Visits 24   Date for PT Re-Evaluation 04/29/16   PT Start Time 0848   PT Stop Time 0931   PT Time Calculation (min) 43 min   Activity Tolerance Patient tolerated treatment well   Behavior During Therapy [facility] for tasks assessed/performed      Past Medical History:  Diagnosis Date  . Anxiety    not currently  . Asthma   . Breast cancer ([place])   . Breast cancer of upper-outer quadrant of right female breast ([place]) 07/15/2015  . Eczema   . History of motion sickness   . PONV (postoperative nausea and vomiting)     Past Surgical History:  Procedure Laterality Date  . DENTAL SURGERY  1982   impacted teeth  . PORT-A-CATH REMOVAL N/A 03/16/2016   Procedure: REMOVAL PORT-A-CATH;  Surgeon: Rolm Bookbinder, MD;  Location: [place];  Service: General;  Laterality: N/A;  . PORTACATH PLACEMENT Right 07/31/2015   Procedure: INSERTION PORT-A-CATH WITH ULTRASOUND ;  Surgeon: Rolm Bookbinder, MD;  Location: [place];  Service: General;  Laterality: Right;  . RADIOACTIVE SEED GUIDED MASTECTOMY WITH AXILLARY SENTINEL LYMPH NODE BIOPSY Right 01/08/2016   Procedure: RADIOACTIVE SEED LUMPECTOMY  WITH AXILLARY SENTINEL LYMPH NODE BIOPSY AND BLUE DYE INJECTION;  Surgeon: Rolm Bookbinder, MD;  Location: [place];  Service: General;  Laterality: Right;    There were no vitals filed for this visit.      Subjective Assessment - 04/07/16 0851    Subjective Just had some tightness running down my Rt anterior elbow waking up this morning but after I stretch a few times it always goes away.    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 It measures 1.7 cm in the upper outer quadrant with a Ki67 of 95%. , Lumpectomy on 01/08/16 with lymph node dissection   Patient Stated Goals to reduce my risk of lymphedema, go back to golfing   Currently in Pain? No/denies                         [facility] Adult PT Treatment/Exercise - 04/07/16 0001      Manual Therapy   Soft tissue mobilization at cording in right axilla and proximal right upper arm, in supine; extended work trying to get cord to stretch and release with mobilization in different directions   Myofascial Release right UE myofascial pulling; right UE distraction with concurrent diagonal distraction at left abdomen (in diagonal); crosshands at right axilla; release at right upper arm with arm in abduction, working to release scar area   Passive ROM right shoulder P/ROM in abduction and flexion                      Breast Clinic Goals - 07/24/15 1519      Patient will be able to verbalize understanding of pertinent lymphedema risk reduction practices relevant to her diagnosis specifically related to skin care.   Time 1   Period Days   Status Achieved  Patient will be able to return demonstrate and/or verbalize understanding of the post-op home exercise program related to regaining shoulder range of motion.   Time 1   Period Days   Status Achieved     Patient will be able to verbalize understanding of the importance of attending the postoperative After Breast Cancer Class for further lymphedema risk reduction education and therapeutic exercise.   Time 1   Period Days   Status Achieved          Long Term Clinic Goals - 03/25/16 1205      CC Long Term Goal  #1   Title Pt to demonstrate 165 degrees of right shoulder flexion to allow her to reach items overhead   Status Achieved     CC Long Term Goal  #2   Title Pt to  demonstrate 160 degrees of right shoulder abduction to allow her to reach items out to sides   Status Achieved     CC Long Term Goal  #3   Title Pt to be able to independently verbalize lymphedema risk reduction practices   Status Achieved     CC Long Term Goal  #4   Title Patient to be independent in a home exercise program for continued strengthening and stretching   Status On-going     CC Long Term Goal  #5   Title Pt reports 50% improvement in scar tissue under right lumpectomy scar to improve comfort   Status On-going            Plan - 04/07/16 0932    Clinical Impression Statement Pt has made great gains since beginning of this episode of care. No longer has pain at her end ROM's and reports only tightness felt througout day now with all ADLs. She cotinues to benefit from manual techniques and encouraged her to resume her scapular series with green theraband but to do these in sitting now to progress herself.    Rehab Potential Excellent   Clinical Impairments Affecting Rehab Potential recent radiation with skin irritation at upper outer breast on right   PT Frequency 2x / week   PT Duration 4 weeks   PT Next Visit Plan Remeasure A/ROM and issue new copy as pt reported probably threw hers away not thinking she needd it anymore (forgot at end of session today to do this). Continue to focus on release of cording, then release of tight fascia/soft tissue, and on manual lymph drainage prn.  Prior to discharge, teach Strength ABC program so she will know routine once recovered from radiation    Consulted and Agree with Plan of Care Patient      Patient will benefit from skilled therapeutic intervention in order to improve the following deficits and impairments:  Decreased strength, Decreased knowledge of precautions, Pain, Impaired UE functional use, Decreased range of motion, Decreased scar mobility  Visit Diagnosis: Stiffness of right shoulder, not elsewhere classified  Acute  pain of right shoulder  Abnormal posture     Problem List Patient Active Problem List   Diagnosis Date Noted  . Hypersensitivity reaction 10/04/2015  . Genetic testing 09/09/2015  . Breast cancer of upper-outer quadrant of right female breast ([place]) 07/15/2015  . Moderate persistent asthma 02/25/2015  . Allergic rhinitis due to pollen 02/25/2015  . HIP PAIN, BILATERAL 07/26/2008  . ECZEMA, ATOPIC DERMATITIS 05/27/2006    Otelia Limes, PTA 04/07/2016, 9:35 AM  [place], [place], [address]  Phone: [phone]   Fax:  [phone]  Name: ALYSSAMARIE MOUNSEY MRN: [id number] Date of Birth: 08-03-1965

## 2016-04-10 ENCOUNTER — Ambulatory Visit: Payer: BLUE CROSS/BLUE SHIELD | Admitting: Physical Therapy

## 2016-04-10 DIAGNOSIS — M25611 Stiffness of right shoulder, not elsewhere classified: Secondary | ICD-10-CM

## 2016-04-10 DIAGNOSIS — M25511 Pain in right shoulder: Secondary | ICD-10-CM

## 2016-04-10 NOTE — Therapy (Signed)
[place], [place], [address] Phone: [phone]   Fax:  [phone]  Physical Therapy Treatment  Patient Details  Name: Courtney Gomez MRN: [id number] Date of Birth: 1965-06-11 Referring Provider: Lindi Adie  Encounter Date: 04/10/2016      PT End of Session - 04/10/16 1029    Visit Number 21   Number of Visits 24   Date for PT Re-Evaluation 04/29/16   PT Start Time 0803   PT Stop Time 0846   PT Time Calculation (min) 43 min   Activity Tolerance Patient tolerated treatment well   Behavior During Therapy [facility] for tasks assessed/performed      Past Medical History:  Diagnosis Date  . Anxiety    not currently  . Asthma   . Breast cancer ([place])   . Breast cancer of upper-outer quadrant of right female breast ([place]) 07/15/2015  . Eczema   . History of motion sickness   . PONV (postoperative nausea and vomiting)     Past Surgical History:  Procedure Laterality Date  . DENTAL SURGERY  1982   impacted teeth  . PORT-A-CATH REMOVAL N/A 03/16/2016   Procedure: REMOVAL PORT-A-CATH;  Surgeon: Rolm Bookbinder, MD;  Location: [place];  Service: General;  Laterality: N/A;  . PORTACATH PLACEMENT Right 07/31/2015   Procedure: INSERTION PORT-A-CATH WITH ULTRASOUND ;  Surgeon: Rolm Bookbinder, MD;  Location: [place];  Service: General;  Laterality: Right;  . RADIOACTIVE SEED GUIDED MASTECTOMY WITH AXILLARY SENTINEL LYMPH NODE BIOPSY Right 01/08/2016   Procedure: RADIOACTIVE SEED LUMPECTOMY  WITH AXILLARY SENTINEL LYMPH NODE BIOPSY AND BLUE DYE INJECTION;  Surgeon: Rolm Bookbinder, MD;  Location: [place];  Service: General;  Laterality: Right;    There were no vitals filed for this visit.      Subjective Assessment - 04/10/16 0805    Subjective "II haven't done any of the stretches we talked about."  Got a good regular report from Dr. Donne Hazel; just goes back in a year. "I did yoga on  Wednesday."   Currently in Pain? No/denies            [facility] PT Assessment - 04/10/16 0001      AROM   Right Shoulder Flexion 151 Degrees   Right Shoulder ABduction 172 Degrees                     [facility] Adult PT Treatment/Exercise - 04/10/16 0001      Manual Therapy   Soft tissue mobilization at cording in right axilla and proximal right upper arm, in supine; extended work trying to get cord to stretch and release with mobilization in different directions   Myofascial Release right UE myofascial pulling to tolerance; release across right axilla with pull in two directions and pull in one direction   Passive ROM right shoulder P/ROM in abduction and flexion                PT Education - 04/10/16 1029    Education provided Yes   Education Details supine scapular series to progress to sitting, then standing   Person(s) Educated Patient   Methods Explanation;Handout   Comprehension Verbalized understanding              Breast Clinic Goals - 07/24/15 1519      Patient will be able to verbalize understanding of pertinent lymphedema risk reduction practices relevant to her diagnosis specifically related to skin care.   Time 1  Period Days   Status Achieved     Patient will be able to return demonstrate and/or verbalize understanding of the post-op home exercise program related to regaining shoulder range of motion.   Time 1   Period Days   Status Achieved     Patient will be able to verbalize understanding of the importance of attending the postoperative After Breast Cancer Class for further lymphedema risk reduction education and therapeutic exercise.   Time 1   Period Days   Status Achieved          Long Term Clinic Goals - 04/10/16 1031      CC Long Term Goal  #4   Title Patient to be independent in a home exercise program for continued strengthening and stretching   Baseline She has gone to yoga class recently as of 04/10/16.   Status  On-going     CC Long Term Goal  #5   Title Pt reports 50% improvement in scar tissue under right lumpectomy scar to improve comfort   Status On-going            Plan - 04/10/16 1030    Clinical Impression Statement Patient does feel that therapy continues to help her and wants to continue because of this.     Rehab Potential Excellent   Clinical Impairments Affecting Rehab Potential recent radiation with skin irritation at upper outer breast on right   PT Frequency 2x / week   PT Duration 4 weeks   PT Treatment/Interventions Therapeutic exercise;Patient/family education;ADLs/Self Care Home Management;Passive range of motion;Manual techniques;Orthotic Fit/Training   PT Next Visit Plan Focus on myofascial release, soft tissue mobilization, and stretching at right upper quadrant.   Consulted and Agree with Plan of Care Patient      Patient will benefit from skilled therapeutic intervention in order to improve the following deficits and impairments:  Decreased strength, Decreased knowledge of precautions, Pain, Impaired UE functional use, Decreased range of motion, Decreased scar mobility  Visit Diagnosis: Stiffness of right shoulder, not elsewhere classified  Acute pain of right shoulder     Problem List Patient Active Problem List   Diagnosis Date Noted  . Hypersensitivity reaction 10/04/2015  . Genetic testing 09/09/2015  . Breast cancer of upper-outer quadrant of right female breast ([place]) 07/15/2015  . Moderate persistent asthma 02/25/2015  . Allergic rhinitis due to pollen 02/25/2015  . HIP PAIN, BILATERAL 07/26/2008  . ECZEMA, ATOPIC DERMATITIS 05/27/2006    Courtney Gomez 04/10/2016, 10:32 AM  [place], [place], [address] Phone: [phone]   Fax:  [phone]  Name: Courtney Gomez MRN: [id number] Date of Birth: [birth year]  Serafina Royals, PT 04/10/16 10:33 AM

## 2016-04-10 NOTE — Patient Instructions (Signed)
Over Head Pull: Narrow Grip    [phone]   Do these in sitting, first with the yellow band and then progress to red and/or green.  Later, progress to doing these in standing.     On back, knees bent, feet flat, band across thighs, elbows straight but relaxed. Pull hands apart (start). Keeping elbows straight, bring arms up and over head, hands toward floor. Keep pull steady on band. Hold momentarily. Return slowly, keeping pull steady, back to start. Repeat ___ times. Band color ______   Side Pull: Double Arm   On back, knees bent, feet flat. Arms perpendicular to body, shoulder level, elbows straight but relaxed. Pull arms out to sides, elbows straight. Resistance band comes across collarbones, hands toward floor. Hold momentarily. Slowly return to starting position. Repeat ___ times. Band color _____   Sash   On back, knees bent, feet flat, left hand on left hip, right hand above left. Pull right arm DIAGONALLY (hip to shoulder) across chest. Bring right arm along head toward floor. Hold momentarily. Slowly return to starting position. Repeat ___ times. Do with left arm. Band color ______   Shoulder Rotation: Double Arm   On back, knees bent, feet flat, elbows tucked at sides, bent 90, hands palms up. Pull hands apart and down toward floor, keeping elbows near sides. Hold momentarily. Slowly return to starting position. Repeat ___ times. Band color ______

## 2016-04-14 ENCOUNTER — Telehealth: Payer: Self-pay | Admitting: *Deleted

## 2016-04-14 ENCOUNTER — Ambulatory Visit: Payer: BLUE CROSS/BLUE SHIELD | Admitting: Physical Therapy

## 2016-04-14 NOTE — Telephone Encounter (Signed)
Pt called in with c/o "feeling dizzy and woozy". Pt denies fever, chills or cold like symptoms. Relate she has eaten lunch and had "about 8oz of water". Recommended pt go to urgent care. Pt relate she will increase her fluid intake and if she doesn't "feel better" she will then go to urgent care.

## 2016-04-17 ENCOUNTER — Encounter: Payer: 59 | Admitting: Physical Therapy

## 2016-04-21 ENCOUNTER — Ambulatory Visit: Payer: BLUE CROSS/BLUE SHIELD

## 2016-04-21 DIAGNOSIS — M25611 Stiffness of right shoulder, not elsewhere classified: Secondary | ICD-10-CM | POA: Diagnosis not present

## 2016-04-21 DIAGNOSIS — R293 Abnormal posture: Secondary | ICD-10-CM

## 2016-04-21 DIAGNOSIS — M25511 Pain in right shoulder: Secondary | ICD-10-CM

## 2016-04-21 NOTE — Therapy (Addendum)
[place], [place], [address] Phone: [phone]   Fax:  [phone]  Physical Therapy Treatment  Patient Details  Name: Courtney Gomez MRN: [id number] Date of Birth: 1965-09-17 Referring Provider: Lindi Adie  Encounter Date: 04/21/2016    Past Medical History:  Diagnosis Date  . Anxiety    not currently  . Asthma   . Breast cancer ([place])   . Breast cancer of upper-outer quadrant of right female breast ([place]) 07/15/2015  . Eczema   . History of motion sickness   . PONV (postoperative nausea and vomiting)     Past Surgical History:  Procedure Laterality Date  . DENTAL SURGERY  1982   impacted teeth  . PORT-A-CATH REMOVAL N/A 03/16/2016   Procedure: REMOVAL PORT-A-CATH;  Surgeon: Rolm Bookbinder, MD;  Location: [place];  Service: General;  Laterality: N/A;  . PORTACATH PLACEMENT Right 07/31/2015   Procedure: INSERTION PORT-A-CATH WITH ULTRASOUND ;  Surgeon: Rolm Bookbinder, MD;  Location: [place];  Service: General;  Laterality: Right;  . RADIOACTIVE SEED GUIDED MASTECTOMY WITH AXILLARY SENTINEL LYMPH NODE BIOPSY Right 01/08/2016   Procedure: RADIOACTIVE SEED LUMPECTOMY  WITH AXILLARY SENTINEL LYMPH NODE BIOPSY AND BLUE DYE INJECTION;  Surgeon: Rolm Bookbinder, MD;  Location: [place];  Service: General;  Laterality: Right;    There were no vitals filed for this visit.                                     Breast Clinic Goals - 07/24/15 1519      Patient will be able to verbalize understanding of pertinent lymphedema risk reduction practices relevant to her diagnosis specifically related to skin care.   Time 1   Period Days   Status Achieved     Patient will be able to return demonstrate and/or verbalize understanding of the post-op home exercise program related to regaining shoulder range of motion.   Time 1   Period Days   Status Achieved     Patient will be able to verbalize understanding of the importance of attending the postoperative After Breast Cancer Class for further lymphedema risk reduction education and therapeutic exercise.   Time 1   Period Days   Status Achieved          Long Term Clinic Goals - 04/29/16 1720      CC Long Term Goal  #1   Title Pt to demonstrate 165 degrees of right shoulder flexion to allow her to reach items overhead   Status Achieved     CC Long Term Goal  #2   Title Pt to demonstrate 160 degrees of right shoulder abduction to allow her to reach items out to sides   Status Achieved     CC Long Term Goal  #3   Title Pt to be able to independently verbalize lymphedema risk reduction practices   Status Achieved     CC Long Term Goal  #4   Title Patient to be independent in a home exercise program for continued strengthening and stretching   Status Achieved     CC Long Term Goal  #5   Title Pt reports 50% improvement in scar tissue under right lumpectomy scar to improve comfort   Status Partially Met          Patient will benefit from skilled therapeutic intervention in order to improve the following deficits  and impairments:  Decreased strength, Decreased knowledge of precautions, Pain, Impaired UE functional use, Decreased range of motion, Decreased scar mobility  Visit Diagnosis: Stiffness of right shoulder, not elsewhere classified  Acute pain of right shoulder  Abnormal posture     Problem List Patient Active Problem List   Diagnosis Date Noted  . Hypersensitivity reaction 10/04/2015  . Genetic testing 09/09/2015  . Breast cancer of upper-outer quadrant of right female breast ([place]) 07/15/2015  . Moderate persistent asthma 02/25/2015  . Allergic rhinitis due to pollen 02/25/2015  . HIP PAIN, BILATERAL 07/26/2008  . ECZEMA, ATOPIC DERMATITIS 05/27/2006    Gomez,Courtney, PTA 04/29/2016, 5:21 PM  [place], [place], [address] Phone: [phone]   Fax:  [phone]  Name: Courtney Gomez MRN: [id number] Date of Birth: 1965-09-05  PHYSICAL THERAPY DISCHARGE SUMMARY  Visits from Start of Care: 22  Current functional level related to goals / functional outcomes: Goals met for the most part, as noted above.   Remaining deficits: Mild tightness at right axilla.   Education / Equipment: Youth worker program, lymphedema risk reduction. Plan: Patient agrees to discharge.  Patient goals were partially met. Patient is being discharged due to being pleased with the current functional level.  ?????    Serafina Royals, PT 04/29/16 5:22 PM

## 2016-04-29 ENCOUNTER — Other Ambulatory Visit: Payer: Self-pay | Admitting: Allergy and Immunology

## 2016-05-16 ENCOUNTER — Ambulatory Visit (INDEPENDENT_AMBULATORY_CARE_PROVIDER_SITE_OTHER): Payer: BLUE CROSS/BLUE SHIELD

## 2016-05-16 ENCOUNTER — Ambulatory Visit (INDEPENDENT_AMBULATORY_CARE_PROVIDER_SITE_OTHER): Payer: BLUE CROSS/BLUE SHIELD | Admitting: Family Medicine

## 2016-05-16 VITALS — BP 126/70 | HR 94 | Temp 97.7°F | Resp 18 | Ht 65.75 in | Wt 142.2 lb

## 2016-05-16 DIAGNOSIS — M25572 Pain in left ankle and joints of left foot: Secondary | ICD-10-CM

## 2016-05-16 DIAGNOSIS — S93422A Sprain of deltoid ligament of left ankle, initial encounter: Secondary | ICD-10-CM | POA: Diagnosis not present

## 2016-05-16 NOTE — Patient Instructions (Addendum)
IF you received an x-ray today, you will receive an invoice from [facility]. Please contact [facility] at [phone] with questions or concerns regarding your invoice.   IF you received labwork today, you will receive an invoice from [facility]. Please contact [facility] at [phone] with questions or concerns regarding your invoice.   Our billing staff will not be able to assist you with questions regarding bills from these companies.  You will be contacted with the lab results as soon as they are available. The fastest way to get your results is to activate your My Chart account. Instructions are located on the last page of this paperwork. If you have not heard from [place] regarding the results in 2 weeks, please contact this office.      Ankle Sprain, Phase I Rehab Ask your health care provider which exercises are safe for you. Do exercises exactly as told by your health care provider and adjust them as directed. It is normal to feel mild stretching, pulling, tightness, or discomfort as you do these exercises, but you should stop right away if you feel sudden pain or your pain gets worse.Do not begin these exercises until told by your health care provider. Stretching and range of motion exercises These exercises warm up your muscles and joints and improve the movement and flexibility of your lower leg and ankle. These exercises also help to relieve pain and stiffness. Exercise A: Gastroc and soleus stretch 1. Sit on the floor with your left / right leg extended. 2. Loop a belt or towel around the ball of your left / right foot. The ball of your foot is on the walking surface, right under your toes. 3. Keep your left / right ankle and foot relaxed and keep your knee straight while you use the belt or towel to pull your foot toward you. You should feel a gentle stretch behind your calf or knee. 4. Hold this position for __________ seconds, then release to the starting  position. Repeat the exercise with your knee bent. You can put a pillow or a rolled bath towel under your knee to support it. You should feel a stretch deep in your calf or at your Achilles tendon. Repeat each stretch __________ times. Complete these stretches __________ times a day. Exercise B: Ankle alphabet 1. Sit with your left / right leg supported at the lower leg.  Do not rest your foot on anything.  Make sure your foot has room to move freely. 2. Think of your left / right foot as a paintbrush, and move your foot to trace each letter of the alphabet in the air. Keep your hip and knee still while you trace. Make the letters as large as you can without feeling discomfort. 3. Trace every letter from A to Z. Repeat __________ times. Complete this exercise __________ times a day. Strengthening exercises These exercises build strength and endurance in your ankle and lower leg. Endurance is the ability to use your muscles for a long time, even after they get tired. Exercise C: Dorsiflexors 1. Secure a rubber exercise band or tube to an object, such as a table leg, that will stay still when the band is pulled. Secure the other end around your left / right foot. 2. Sit on the floor facing the object, with your left / right leg extended. The band or tube should be slightly tense when your foot is relaxed. 3. Slowly bring your foot toward you, pulling the band tighter.  4. Hold this position for __________ seconds. 5. Slowly return your foot to the starting position. Repeat __________ times. Complete this exercise __________ times a day. Exercise D: Plantar flexors 1. Sit on the floor with your left / right leg extended. 2. Loop a rubber exercise tube or band around the ball of your left / right foot. The ball of your foot is on the walking surface, right under your toes.  Hold the ends of the band or tube in your hands.  The band or tube should be slightly tense when your foot is  relaxed. 3. Slowly point your foot and toes downward, pushing them away from you. 4. Hold this position for __________ seconds. 5. Slowly return your foot to the starting position. Repeat __________ times. Complete this exercise __________ times a day. Exercise E: Evertors 1. Sit on the floor with your legs straight out in front of you. 2. Loop a rubber exercise band or tube around the ball of your left / right foot. The ball of your foot is on the walking surface, right under your toes.  Hold the ends of the band in your hands, or secure the band to a stable object.  The band or tube should be slightly tense when your foot is relaxed. 3. Slowly push your foot outward, away from your other leg. 4. Hold this position for __________ seconds. 5. Slowly return your foot to the starting position. Repeat __________ times. Complete this exercise __________ times a day. This information is not intended to replace advice given to you by your health care provider. Make sure you discuss any questions you have with your health care provider. Document Released: 10/15/2004 Document Revised: 11/21/2015 Document Reviewed: 01/28/2015 Elsevier Interactive Patient Education  2017 [employer].

## 2016-05-16 NOTE — Progress Notes (Signed)
Subjective:    Patient ID: Courtney Gomez, female    DOB: 1965-07-18, 51 y.o.   MRN: [id number]  05/16/2016  Ankle Injury (left ankle pain x 3 wks (fell))   HPI This 51 y.o. female presents for evaluation of L ankle sprain; occurred three weeks ago.  Tripped over a rope on ski trip.  Blue and swollen medial.  Slow to improve.  No n/t/w.  Wearing supportive tennis shoes and brace.  Presenting now due to prolonged pain and prolonged limping.   Review of Systems  Constitutional: Negative for chills, diaphoresis, fatigue and fever.  HENT: Negative for ear pain, postnasal drip, rhinorrhea, sinus pressure, sore throat and trouble swallowing.   Respiratory: Negative for cough and shortness of breath.   Cardiovascular: Negative for chest pain, palpitations and leg swelling.  Gastrointestinal: Negative for abdominal pain, constipation, diarrhea, nausea and vomiting.  Musculoskeletal: Positive for arthralgias, gait problem and joint swelling.  Neurological: Negative for weakness and numbness.    Past Medical History:  Diagnosis Date  . Anxiety    not currently  . Asthma   . Breast cancer ([place])   . Breast cancer of upper-outer quadrant of right female breast ([place]) 07/15/2015  . Eczema   . History of motion sickness   . PONV (postoperative nausea and vomiting)    Past Surgical History:  Procedure Laterality Date  . DENTAL SURGERY  1982   impacted teeth  . PORT-A-CATH REMOVAL N/A 03/16/2016   Procedure: REMOVAL PORT-A-CATH;  Surgeon: Rolm Bookbinder, MD;  Location: [place];  Service: General;  Laterality: N/A;  . PORTACATH PLACEMENT Right 07/31/2015   Procedure: INSERTION PORT-A-CATH WITH ULTRASOUND ;  Surgeon: Rolm Bookbinder, MD;  Location: [place];  Service: General;  Laterality: Right;  . RADIOACTIVE SEED GUIDED MASTECTOMY WITH AXILLARY SENTINEL LYMPH NODE BIOPSY Right 01/08/2016   Procedure: RADIOACTIVE SEED LUMPECTOMY  WITH AXILLARY SENTINEL LYMPH NODE BIOPSY AND BLUE DYE  INJECTION;  Surgeon: Rolm Bookbinder, MD;  Location: [place];  Service: General;  Laterality: Right;   Allergies  Allergen Reactions  . Lanolin Itching  . Latex Itching and Swelling   Current Outpatient Prescriptions  Medication Sig Dispense Refill  . albuterol (PROAIR HFA) 108 (90 BASE) MCG/ACT inhaler Inhale 2 puffs into the lungs every 6 (six) hours as needed for wheezing or shortness of breath. Reported on 08/23/2015    . budesonide-formoterol (SYMBICORT) 160-4.5 MCG/ACT inhaler Inhale 2 puffs into the lungs 2 (two) times daily. 1 Inhaler 5  . cetirizine (ZYRTEC) 10 MG tablet Take 10 mg by mouth daily. Reported on 08/23/2015    . fluticasone (FLONASE) 50 MCG/ACT nasal spray PLACE 1 SPRAY INTO BOTH NOSTRILS DAILY. 16 g 2  . montelukast (SINGULAIR) 10 MG tablet TAKE 1 TABLET (10 MG TOTAL) BY MOUTH AT BEDTIME. 30 tablet 4  . non-metallic deodorant (ALRA) MISC Apply 1 application topically daily as needed.     No current facility-administered medications for this visit.    Social History   Social History  . Marital status: Married    Spouse name: N/A  . Number of children: N/A  . Years of education: N/A   Occupational History  . Not on file.   Social History Main Topics  . Smoking status: Never Smoker  . Smokeless tobacco: Never Used  . Alcohol use 0.0 oz/week     Comment: ocassional  . Drug use: No  . Sexual activity: Not Currently     Comment: Was on BCP stopped  07/13/15   Other Topics Concern  . Not on file   Social History Narrative  . No narrative on file   Family History  Problem Relation Age of Onset  . Colon polyps Mother     hx of one large colon polyp s/p resection  . Other Mother     hx of benign teratoma and molar pregnancy  . Prostate cancer Father 38  . Colon polyps Father     possible colon polyp hx; +diverticulitis  . Diverticulitis Father   . Other Maternal Uncle     hx of unspecified "lump on his back"  . Lupus Paternal Aunt     . Heart attack Paternal Uncle     d. 62s; smoker  . Heart Problems Paternal Uncle   . Colon cancer Maternal Grandmother 63    possible colon cancer  . Lung cancer Maternal Grandfather 37    smoker; +EtOH  . COPD Paternal Grandmother   . Heart Problems Paternal Grandmother   . Heart Problems Paternal Grandfather   . Prostate cancer Paternal Grandfather     possible prostate cancer dx. early 56s  . Stomach cancer Other     maternal great grandmother (MGF's mother); dx. late 72s  . Colon cancer Other 67    paternal great grandmother (PGM's mother)  . Stomach cancer Other     paternal great uncles (PGF's brother) d. late 53s-early 18s       Objective:    BP 126/70   Pulse 94   Temp 97.7 F (36.5 C) (Oral)   Resp 18   Ht 5' 5.75" (1.67 m)   Wt 142 lb 4 oz (64.5 kg)   SpO2 99%   BMI 23.13 kg/m  Physical Exam  Constitutional: She is oriented to person, place, and time. She appears well-developed and well-nourished. No distress.  HENT:  Head: Normocephalic and atraumatic.  Eyes: Conjunctivae are normal. Pupils are equal, round, and reactive to light.  Neck: Normal range of motion. Neck supple.  Cardiovascular: Normal rate, regular rhythm and normal heart sounds.  Exam reveals no gallop and no friction rub.   No murmur heard. Pulses:      Dorsalis pedis pulses are 2+ on the left side.       Posterior tibial pulses are 2+ on the left side.  Pulmonary/Chest: Effort normal and breath sounds normal. She has no wheezes. She has no rales.  Musculoskeletal:       Left ankle: She exhibits decreased range of motion and swelling. Tenderness. Medial malleolus tenderness found. No lateral malleolus, no AITFL, no CF ligament, no posterior TFL, no head of 5th metatarsal and no proximal fibula tenderness found. Achilles tendon normal. Achilles tendon exhibits no pain.       Left lower leg: Normal.  Neurological: She is alert and oriented to person, place, and time.  Skin: She is not  diaphoretic.  Psychiatric: She has a normal mood and affect. Her behavior is normal.  Nursing note and vitals reviewed.  Depression screen [facility] 2/9 05/16/2016 01/27/2016  Decreased Interest 0 0  Down, Depressed, Hopeless 0 0  PHQ - 2 Score 0 0       Assessment & Plan:   1. Sprain of left medial ankle joint, initial encounter   2. Acute left ankle pain    -New. -recommend ongoing rest, icing, elevation. -recommend wearing ankle brace daily for two weeks. -home exercise program provided to perform daily for strengthening.   Orders Placed This Encounter  Procedures  . DG Ankle Complete Left    Standing Status:   Future    Number of Occurrences:   1    Standing Expiration Date:   05/16/2017    Order Specific Question:   Reason for Exam (SYMPTOM  OR DIAGNOSIS REQUIRED)    Answer:   L medial ankle pain    Order Specific Question:   Is the patient pregnant?    Answer:   No    Order Specific Question:   Preferred imaging location?    Answer:   External   No orders of the defined types were placed in this encounter.   No Follow-up on file.   Cybele Maule Elayne Guerin, M.D. Primary Care at [facility] previously [facility] [address] [place],   [address] [phone] phone [phone] fax

## 2016-06-12 ENCOUNTER — Other Ambulatory Visit: Payer: Self-pay | Admitting: Hematology and Oncology

## 2016-06-12 DIAGNOSIS — Z1231 Encounter for screening mammogram for malignant neoplasm of breast: Secondary | ICD-10-CM

## 2016-06-12 DIAGNOSIS — Z853 Personal history of malignant neoplasm of breast: Secondary | ICD-10-CM

## 2016-06-13 ENCOUNTER — Telehealth: Payer: Self-pay | Admitting: Adult Health

## 2016-06-13 NOTE — Telephone Encounter (Signed)
Scheduled appt for Survivorship Care plan. Patient is aware of time and date. Sent out Careers information officer.

## 2016-06-30 ENCOUNTER — Encounter: Payer: Self-pay | Admitting: Allergy and Immunology

## 2016-06-30 ENCOUNTER — Ambulatory Visit (INDEPENDENT_AMBULATORY_CARE_PROVIDER_SITE_OTHER): Payer: BLUE CROSS/BLUE SHIELD | Admitting: Allergy and Immunology

## 2016-06-30 VITALS — BP 110/80 | HR 78 | Temp 98.7°F | Resp 18 | Ht 66.0 in | Wt 143.0 lb

## 2016-06-30 DIAGNOSIS — L299 Pruritus, unspecified: Secondary | ICD-10-CM

## 2016-06-30 DIAGNOSIS — J4540 Moderate persistent asthma, uncomplicated: Secondary | ICD-10-CM

## 2016-06-30 DIAGNOSIS — J3089 Other allergic rhinitis: Secondary | ICD-10-CM | POA: Diagnosis not present

## 2016-06-30 NOTE — Assessment & Plan Note (Signed)
   For now, continue Symbicort 160-4.5 g, 2 inhalations via spacer device twice a day, montelukast 10 mg daily bedtime, and albuterol HFA, 1-2 inhalations every 4-6 hours as needed.  If subjective and objective measures of pulmonary function are stable on her next visit, we will consider stepping down therapy.

## 2016-06-30 NOTE — Assessment & Plan Note (Signed)
   I have encouraged moisturizing the skin and antihistamines as needed, such as cetirizine or levocetirizine.  If this problem persists or progresses we will evaluate further.

## 2016-06-30 NOTE — Assessment & Plan Note (Signed)
Stable.  Continue appropriate allergen avoidance measures, cetirizine as needed, Nasonex as needed, and nasal saline irrigation as needed.

## 2016-06-30 NOTE — Progress Notes (Signed)
Follow-up Note  RE: Courtney Gomez MRN: [id number] DOB: [birth year] Date of Office Visit: 06/30/2016  Primary care provider: Paulo Fruit, MD Referring provider: Vania Rea, MD  History of present illness: Courtney Gomez is a 51 y.o. female with persistent asthma and allergic rhinitis presenting today for follow up.  She was last seen in this clinic in December 2017.  In the interval since her previous visit her upper and lower respiratory symptoms a been well-controlled.  She rarely requires albuterol rescue and denies nocturnal awakenings due to lower respiratory symptoms.  She has no nasal symptom complaints today.  She does complain of occasional itchy skin on her anterior neck when the skin comes in contact with scarfs or collar of certain shirts.  This seems to be worse when the air is cold and dry.   Assessment and plan: Moderate persistent asthma  For now, continue Symbicort 160-4.5 g, 2 inhalations via spacer device twice a day, montelukast 10 mg daily bedtime, and albuterol HFA, 1-2 inhalations every 4-6 hours as needed.  If subjective and objective measures of pulmonary function are stable on her next visit, we will consider stepping down therapy.  Allergic rhinitis Stable.  Continue appropriate allergen avoidance measures, cetirizine as needed, Nasonex as needed, and nasal saline irrigation as needed.  Pruritus  I have encouraged moisturizing the skin and antihistamines as needed, such as cetirizine or levocetirizine.  If this problem persists or progresses we will evaluate further.   Diagnostics: Spirometry reveals an FVC of 3.29 L 7 (87% predicted) and FEV1 of 2.09 L (70% predicted) with an FEV1 ratio of 80%.  Mild airways obstruction with an FEV1 slightly decreased from previous study.  See spirometry.    Physical examination: Blood pressure 110/80, pulse 78, temperature 98.7 F (37.1 C), temperature source Oral, resp. rate 18, height 5\' 6"  (1.676 m),  weight 143 lb (64.9 kg), SpO2 95 %.  General: Alert, interactive, in no acute distress. HEENT: TMs pearly gray, turbinates minimally edematous without discharge, post-pharynx unremarkable. Neck: Supple without lymphadenopathy. Lungs: Clear to auscultation without wheezing, rhonchi or rales. CV: Normal S1, S2 without murmurs. Skin: mildly erythematous patches on the anterior neck and face.  The following portions of the patient's history were reviewed and updated as appropriate: allergies, current medications, past family history, past medical history, past social history, past surgical history and problem list.  Allergies as of 06/30/2016      Reactions   Lanolin Itching   Latex Itching, Swelling      Medication List       Accurate as of 06/30/16  1:19 PM. Always use your most recent med list.          budesonide-formoterol 160-4.5 MCG/ACT inhaler Commonly known as:  SYMBICORT Inhale 2 puffs into the lungs 2 (two) times daily.   cetirizine 10 MG tablet Commonly known as:  ZYRTEC Take 10 mg by mouth daily as needed. Reported on 08/23/2015   fluticasone 50 MCG/ACT nasal spray Commonly known as:  FLONASE PLACE 1 SPRAY INTO BOTH NOSTRILS DAILY.   montelukast 10 MG tablet Commonly known as:  SINGULAIR TAKE 1 TABLET (10 MG TOTAL) BY MOUTH AT BEDTIME.   non-metallic deodorant Misc Commonly known as:  ALRA Apply 1 application topically daily as needed.   PROAIR HFA 108 (90 Base) MCG/ACT inhaler Generic drug:  albuterol Inhale 2 puffs into the lungs every 6 (six) hours as needed for wheezing or shortness of breath. Reported on 08/23/2015  Allergies  Allergen Reactions  . Lanolin Itching  . Latex Itching and Swelling    I appreciate the opportunity to take part in [place] care. Please do not hesitate to contact me with questions.  Sincerely,   R. Edgar Frisk, MD

## 2016-06-30 NOTE — Patient Instructions (Signed)
Moderate persistent asthma  For now, continue Symbicort 160-4.5 g, 2 inhalations via spacer device twice a day, montelukast 10 mg daily bedtime, and albuterol HFA, 1-2 inhalations every 4-6 hours as needed.  If subjective and objective measures of pulmonary function are stable on her next visit, we will consider stepping down therapy.  Allergic rhinitis Stable.  Continue appropriate allergen avoidance measures, cetirizine as needed, Nasonex as needed, and nasal saline irrigation as needed.  Pruritus  I have encouraged moisturizing the skin and antihistamines as needed, such as cetirizine or levocetirizine.  If this problem persists or progresses we will evaluate further.   Return in about 6 months (around 12/30/2016), or if symptoms worsen or fail to improve.

## 2016-07-07 ENCOUNTER — Ambulatory Visit
Admission: RE | Admit: 2016-07-07 | Discharge: 2016-07-07 | Disposition: A | Discharge status: Home / Self Care | Payer: BLUE CROSS/BLUE SHIELD | Source: Ambulatory Visit | Attending: Hematology and Oncology | Admitting: Hematology and Oncology

## 2016-07-07 DIAGNOSIS — Z853 Personal history of malignant neoplasm of breast: Secondary | ICD-10-CM

## 2016-07-07 DIAGNOSIS — Z1231 Encounter for screening mammogram for malignant neoplasm of breast: Secondary | ICD-10-CM

## 2016-07-07 HISTORY — DX: Personal history of antineoplastic chemotherapy: Z92.21

## 2016-07-07 HISTORY — DX: Personal history of irradiation: Z92.3

## 2016-07-15 ENCOUNTER — Encounter: Payer: Self-pay | Admitting: Pharmacist

## 2016-07-15 ENCOUNTER — Encounter: Payer: Self-pay | Admitting: Hematology and Oncology

## 2016-07-15 ENCOUNTER — Ambulatory Visit (HOSPITAL_BASED_OUTPATIENT_CLINIC_OR_DEPARTMENT_OTHER): Payer: BLUE CROSS/BLUE SHIELD | Admitting: Hematology and Oncology

## 2016-07-15 DIAGNOSIS — C50411 Malignant neoplasm of upper-outer quadrant of right female breast: Secondary | ICD-10-CM

## 2016-07-15 DIAGNOSIS — Z170 Estrogen receptor positive status [ER+]: Secondary | ICD-10-CM

## 2016-07-15 NOTE — Progress Notes (Signed)
Patient Care Team: Vania Rea, MD as PCP - General (Obstetrics and Gynecology)  DIAGNOSIS:  Encounter Diagnosis  Name Primary?  . Malignant neoplasm of upper-outer quadrant of right breast in female, estrogen receptor positive ([place])     SUMMARY OF ONCOLOGIC HISTORY:   Breast cancer of upper-outer quadrant of right female breast ([place])   07/11/2015 Mammogram    Right breast mass in the axillary tail 1.7 x 1.5 x 1.3 cm axillary ultrasound negative, T1c N0 stage IA clinical stage      07/11/2015 Initial Diagnosis    Right breast biopsy 10:00 position: Invasive ductal carcinoma grade 3, ER 0%, P of 0%, Ki-67 95%, HER-2 negative ratio 1.21; right breast biopsy 12:00: Fibrocystic changes      08/02/2015 - 12/20/2015 Neo-Adjuvant Chemotherapy    Dose dense Adriamycin and Cytoxan 4 followed by Taxol, switched to Abraxane with cycle 2, completed 12 weekly cycles      12/20/2015 Breast MRI    Radiologic complete response. 2 foci of enhancement 12:00 and 11:30 position previously biopsy-proven benign      01/08/2016 Surgery    Right lumpectomy: Focal ALH, fibrocystic change, no residual cancer, 0/7 lymph nodes negative, triple negative disease, complete pathologic response      01/30/2016 - 03/17/2016 Radiation Therapy    Adjuvant radiation therapy       CHIEF COMPLIANT: Surveillance of breast cancer  INTERVAL HISTORY: Courtney Gomez is a 51 year old with above-mentioned history of right breast cancer treated with neoadjuvant chemotherapy followed by lumpectomy and radiation and is currently on surveillance. She is doing quite well without any major problems or concerns. She has mild tenderness in the surgical site but otherwise doing well. Denies any lumps or nodules in the breast.  REVIEW OF SYSTEMS:   Constitutional: Denies fevers, chills or abnormal weight loss Eyes: Denies blurriness of vision Ears, nose, mouth, throat, and face: Denies mucositis or sore throat Respiratory:  Denies cough, dyspnea or wheezes Cardiovascular: Denies palpitation, chest discomfort Gastrointestinal:  Denies nausea, heartburn or change in bowel habits Skin: Denies abnormal skin rashes Lymphatics: Denies new lymphadenopathy or easy bruising Neurological:Denies numbness, tingling or new weaknesses Behavioral/Psych: Mood is stable, no new changes  Extremities: No lower extremity edema Breast:  denies any pain or lumps or nodules in either breasts All other systems were reviewed with the patient and are negative.  I have reviewed the past medical history, past surgical history, social history and family history with the patient and they are unchanged from previous note.  ALLERGIES:  is allergic to lanolin and latex.  MEDICATIONS:  Current Outpatient Prescriptions  Medication Sig Dispense Refill  . albuterol (PROAIR HFA) 108 (90 BASE) MCG/ACT inhaler Inhale 2 puffs into the lungs every 6 (six) hours as needed for wheezing or shortness of breath. Reported on 08/23/2015    . budesonide-formoterol (SYMBICORT) 160-4.5 MCG/ACT inhaler Inhale 2 puffs into the lungs 2 (two) times daily. 1 Inhaler 5  . cetirizine (ZYRTEC) 10 MG tablet Take 10 mg by mouth daily as needed. Reported on 08/23/2015    . fluticasone (FLONASE) 50 MCG/ACT nasal spray PLACE 1 SPRAY INTO BOTH NOSTRILS DAILY. 16 g 2  . montelukast (SINGULAIR) 10 MG tablet TAKE 1 TABLET (10 MG TOTAL) BY MOUTH AT BEDTIME. 30 tablet 4  . non-metallic deodorant (ALRA) MISC Apply 1 application topically daily as needed.     No current facility-administered medications for this visit.     PHYSICAL EXAMINATION: ECOG PERFORMANCE STATUS: 1 - Symptomatic but  completely ambulatory  Vitals:   07/15/16 1530  BP: 134/60  Pulse: (!) 105  Resp: 18  Temp: 97.8 F (36.6 C)   Filed Weights   07/15/16 1530  Weight: 146 lb 1.6 oz (66.3 kg)    GENERAL:alert, no distress and comfortable SKIN: skin color, texture, turgor are normal, no rashes or  significant lesions EYES: normal, Conjunctiva are pink and non-injected, sclera clear OROPHARYNX:no exudate, no erythema and lips, buccal mucosa, and tongue normal  NECK: supple, thyroid normal size, non-tender, without nodularity LYMPH:  no palpable lymphadenopathy in the cervical, axillary or inguinal LUNGS: clear to auscultation and percussion with normal breathing effort HEART: regular rate & rhythm and no murmurs and no lower extremity edema ABDOMEN:abdomen soft, non-tender and normal bowel sounds MUSCULOSKELETAL:no cyanosis of digits and no clubbing  NEURO: alert & oriented x 3 with fluent speech, no focal motor/sensory deficits EXTREMITIES: No lower extremity edema BREAST: No palpable masses or nodules in either right or left breasts. No palpable axillary supraclavicular or infraclavicular adenopathy no breast tenderness or nipple discharge. (exam performed in the presence of a chaperone)  LABORATORY DATA:  I have reviewed the data as listed   Chemistry      Component Value Date/Time   NA 142 03/16/2016 0908   NA 144 12/20/2015 0820   K 3.8 03/16/2016 0908   K 4.2 12/20/2015 0820   CL 107 03/16/2016 0908   CO2 28 03/16/2016 0908   CO2 28 12/20/2015 0820   BUN 6 03/16/2016 0908   BUN 10.7 12/20/2015 0820   CREATININE 0.67 03/16/2016 0908   CREATININE 0.8 12/20/2015 0820      Component Value Date/Time   CALCIUM 9.5 03/16/2016 0908   CALCIUM 9.6 12/20/2015 0820   ALKPHOS 80 12/20/2015 0820   AST 19 12/20/2015 0820   ALT 29 12/20/2015 0820   BILITOT 0.42 12/20/2015 0820       Lab Results  Component Value Date   WBC 3.9 (L) 03/16/2016   HGB 13.3 03/16/2016   HCT 40.0 03/16/2016   MCV 97.8 03/16/2016   PLT 160 03/16/2016   NEUTROABS 2.1 12/20/2015    ASSESSMENT & PLAN:  Breast cancer of upper-outer quadrant of right female breast ([place]) Right mammogram and ultrasound 07/28/2015 Right breast mass in the axillary tail 1.7 x 1.5 x 1.3 cm axillary ultrasound negative,  T1c N0 stage IA clinical stage Right breast biopsy 07/11/2015: 10:00 position: Invasive ductal carcinoma grade 3, ER 0%, PR 0%, Ki-67 95%, HER-2 negative ratio 1.21  Right lumpectomy 01/08/2016: Right lumpectomy: Focal ALH, fibrocystic change, no residual cancer, 0/7 lymph nodes negative, triple negative disease, complete pathologic response  Treatment Summary: 1. Neoadj chemotherapy with Adriamycin and Cytoxan dose dense 4 foll by Taxol x 1 then Abraxaneweekly total 12 weeks from 08/02/2015 to 12/20/2015 2. Followed by breast conserving surgery with sentinel lymph node study 01/08/2016 3. Followed by adjuvant radiation therapy 01/30/2016- 03/17/2016 ------------------------------------------------------------------------------------------------------------------------------ Treatment plan: Surveillance Breast exam 07/15/2016: No palpable lumps or nodules Mammogram 07/07/2016: No mammographic evidence of malignancy in either breast Return to clinic in 1 year for surveillance and follow-up   Patient agreed to participate in the neuropathy pharmacogenomic study.  I spent 25 minutes talking to the patient of which more than half was spent in counseling and coordination of care.  No orders of the defined types were placed in this encounter.  The patient has a good understanding of the overall plan. she agrees with it. she will call with any problems that  may develop before the next visit here.   Rulon Eisenmenger, MD 07/15/16

## 2016-07-15 NOTE — Assessment & Plan Note (Signed)
Right mammogram and ultrasound 07/28/2015 Right breast mass in the axillary tail 1.7 x 1.5 x 1.3 cm axillary ultrasound negative, T1c N0 stage IA clinical stage Right breast biopsy 07/11/2015: 10:00 position: Invasive ductal carcinoma grade 3, ER 0%, PR 0%, Ki-67 95%, HER-2 negative ratio 1.21  Right lumpectomy 01/08/2016: Right lumpectomy: Focal ALH, fibrocystic change, no residual cancer, 0/7 lymph nodes negative, triple negative disease, complete pathologic response  Treatment planbased on multidisciplinary tumor board: 1. Neoadj chemotherapy with Adriamycin and Cytoxan dose dense 4 foll by Taxol x 1 then Abraxaneweekly total 12 weeks from 08/02/2015 to 12/20/2015 2. Followed by breast conserving surgery with sentinel lymph node study 01/08/2016 3. Followed by adjuvant radiation therapy 01/30/2016- 03/17/2016 ------------------------------------------------------------------------------------------------------------------------------ Treatment plan: Surveillance Breast exam 07/15/2016: No palpable lumps or nodules Mammogram 07/07/2016: No mammographic evidence of malignancy in either breast Return to clinic in 6 months for surveillance and follow-up

## 2016-07-16 ENCOUNTER — Encounter: Payer: Self-pay | Admitting: *Deleted

## 2016-07-17 NOTE — Progress Notes (Signed)
Consent documentation  Study code: [id number]  Met with patient and Dr. Lindi Adie during the patient's provider visit on 07/15/16 at 3:30. Provided an overview of the "Pharmacogenetic analysis of toxicities related to administration of taxanes in breast cancer patients" study.  Consent form was reviewed with the patient (reviewed the study purpose, patient's role, possible side effects, cost, risk, information protection) All of the patient's questions were answered and she agreed to participate in the study. A signed copy of the consent form was given to the patient. All eligibility criteria have been met and patient has been enrolled in the study.  Study sample was collected via buccal swab after consent was obtained. Patient was informed that the sample would be sent to the lab for processing after samples were collected from 25 patients and that it would take approximately 2 weeks for the lab to process that sample.   Met with the patient for approximately 15 minutes.  Darl Pikes, PharmD, [place] Clinical Pharmacist- Oncology Pharmacy Resident

## 2016-08-07 ENCOUNTER — Other Ambulatory Visit: Payer: Self-pay

## 2016-08-07 MED ORDER — MONTELUKAST SODIUM 10 MG PO TABS: ORAL_TABLET | ORAL | 1 refills | Status: DC

## 2016-08-07 NOTE — Telephone Encounter (Signed)
Received a fax from [facility] in regards to request a 90 day supply of montelukast. I sent script.

## 2016-08-23 ENCOUNTER — Encounter (HOSPITAL_COMMUNITY): Payer: Self-pay | Admitting: Emergency Medicine

## 2016-08-23 ENCOUNTER — Ambulatory Visit (HOSPITAL_COMMUNITY)
Admission: EM | Admit: 2016-08-23 | Discharge: 2016-08-23 | Disposition: A | Discharge status: Home / Self Care | Payer: BLUE CROSS/BLUE SHIELD | Attending: Family Medicine | Admitting: Family Medicine

## 2016-08-23 DIAGNOSIS — S61032A Puncture wound without foreign body of left thumb without damage to nail, initial encounter: Secondary | ICD-10-CM

## 2016-08-23 DIAGNOSIS — Z283 Underimmunization status: Secondary | ICD-10-CM

## 2016-08-23 DIAGNOSIS — Z23 Encounter for immunization: Secondary | ICD-10-CM

## 2016-08-23 DIAGNOSIS — Z2839 Other underimmunization status: Secondary | ICD-10-CM

## 2016-08-23 MED ORDER — TETANUS-DIPHTH-ACELL PERTUSSIS 5-2.5-18.5 LF-MCG/0.5 IM SUSP
INTRAMUSCULAR | Status: AC
  Filled 2016-08-23: qty 0.5

## 2016-08-23 MED ORDER — TETANUS-DIPHTH-ACELL PERTUSSIS 5-2.5-18.5 LF-MCG/0.5 IM SUSP
0.5000 mL | Freq: Once | INTRAMUSCULAR | Status: AC
  Administered 2016-08-23: 0.5 mL via INTRAMUSCULAR

## 2016-08-23 NOTE — Discharge Instructions (Signed)
For any signs of infection such as redness, swelling, drainage or pus sick medical attention promptly. May return.

## 2016-08-23 NOTE — ED Provider Notes (Signed)
CSN: [id number]     Arrival date & time 08/23/16  1646 History   First MD Initiated Contact with Patient 08/23/16 1746     Chief Complaint  Patient presents with  . Immunizations   (Consider location/radiation/quality/duration/timing/severity/associated sxs/prior Treatment) 51 year old female went to the grind and was moving her cough bag and a metallic object struck her in the pad of the left thumb. This produced a small puncture wound that quite a bit of bleeding. It stopped prior to arrival. Her major concern was that of not having a tetanus shot in the past 10 years.      Past Medical History:  Diagnosis Date  . Anxiety    not currently  . Asthma   . Breast cancer ([place])   . Breast cancer of upper-outer quadrant of right female breast ([place]) 07/15/2015  . Eczema   . History of motion sickness   . Personal history of chemotherapy   . Personal history of radiation therapy   . PONV (postoperative nausea and vomiting)    Past Surgical History:  Procedure Laterality Date  . BREAST BIOPSY Right 08/07/2015  . BREAST BIOPSY Right 07/11/2015  . BREAST LUMPECTOMY Right 01/08/2016  . DENTAL SURGERY  1982   impacted teeth  . PORT-A-CATH REMOVAL N/A 03/16/2016   Procedure: REMOVAL PORT-A-CATH;  Surgeon: Rolm Bookbinder, MD;  Location: [facility];  Service: General;  Laterality: N/A;  . PORTACATH PLACEMENT Right 07/31/2015   Procedure: INSERTION PORT-A-CATH WITH ULTRASOUND ;  Surgeon: Rolm Bookbinder, MD;  Location: [facility];  Service: General;  Laterality: Right;  . RADIOACTIVE SEED GUIDED MASTECTOMY WITH AXILLARY SENTINEL LYMPH NODE BIOPSY Right 01/08/2016   Procedure: RADIOACTIVE SEED LUMPECTOMY  WITH AXILLARY SENTINEL LYMPH NODE BIOPSY AND BLUE DYE INJECTION;  Surgeon: Rolm Bookbinder, MD;  Location: [facility];  Service: General;  Laterality: Right;   Family History  Problem Relation Age of Onset  . Colon polyps Mother        hx of one large colon polyp s/p resection   . Other Mother        hx of benign teratoma and molar pregnancy  . Prostate cancer Father 63  . Colon polyps Father        possible colon polyp hx; +diverticulitis  . Diverticulitis Father   . Other Maternal Uncle        hx of unspecified "lump on his back"  . Lupus Paternal Aunt   . Heart attack Paternal Uncle        d. 52s; smoker  . Heart Problems Paternal Uncle   . Colon cancer Maternal Grandmother 63       possible colon cancer  . Lung cancer Maternal Grandfather 4       smoker; +EtOH  . COPD Paternal Grandmother   . Heart Problems Paternal Grandmother   . Heart Problems Paternal Grandfather   . Prostate cancer Paternal Grandfather        possible prostate cancer dx. early 67s  . Stomach cancer Other        maternal great grandmother (MGF's mother); dx. late 51s  . Colon cancer Other 44       paternal great grandmother (PGM's mother)  . Stomach cancer Other        paternal great uncles (PGF's brother) d. late 81s-early 63s   Social History  Substance Use Topics  . Smoking status: Never Smoker  . Smokeless tobacco: Never Used  . Alcohol use 0.0 oz/week  Comment: ocassional   OB History    No data available     Review of Systems  Constitutional: Negative.   Skin: Positive for wound.  All other systems reviewed and are negative.   Allergies  Lanolin and Latex  Home Medications   Prior to Admission medications   Medication Sig Start Date End Date Taking? Authorizing Provider  albuterol (PROAIR HFA) 108 (90 BASE) MCG/ACT inhaler Inhale 2 puffs into the lungs every 6 (six) hours as needed for wheezing or shortness of breath. Reported on 08/23/2015    [provider]  budesonide-formoterol (SYMBICORT) 160-4.5 MCG/ACT inhaler Inhale 2 puffs into the lungs 2 (two) times daily. 03/02/16   Bobbitt, Sedalia Muta, MD  cetirizine (ZYRTEC) 10 MG tablet Take 10 mg by mouth daily as needed. Reported on 08/23/2015    [provider]  fluticasone  (FLONASE) 50 MCG/ACT nasal spray PLACE 1 SPRAY INTO BOTH NOSTRILS DAILY. 04/29/16   Bobbitt, Sedalia Muta, MD  montelukast (SINGULAIR) 10 MG tablet TAKE 1 TABLET (10 MG TOTAL) BY MOUTH AT BEDTIME. 08/07/16   Bobbitt, Sedalia Muta, MD  non-metallic deodorant Jethro Poling) MISC Apply 1 application topically daily as needed.    [provider]   Meds Ordered and Administered this Visit   Medications  Tdap (BOOSTRIX) injection 0.5 mL (not administered)    BP 125/72 (BP Location: Left Arm)   Pulse 95   Temp 98.2 F (36.8 C) (Oral)   Resp 16   SpO2 98%  No data found.   Physical Exam  Constitutional: She is oriented to person, place, and time. She appears well-developed and well-nourished. No distress.  Pulmonary/Chest: Effort normal.  Musculoskeletal: She exhibits no edema, tenderness or deformity.  Neurological: She is alert and oriented to person, place, and time.  Skin: Skin is warm and dry. Capillary refill takes less than 2 seconds.  Left thumb with very tiny difficult to see puncture wound to the pad. No erythema or swelling or tenderness.  Psychiatric: She has a normal mood and affect.  Nursing note and vitals reviewed.   Urgent Care Course     Procedures (including critical care time)  Labs Review Labs Reviewed - No data to display  Imaging Review No results found.   Visual Acuity Review  Right Eye Distance:   Left Eye Distance:   Bilateral Distance:    Right Eye Near:   Left Eye Near:    Bilateral Near:         MDM   1. Puncture wound of left thumb, initial encounter   2. Immunization deficiency    For any signs of infection such as redness, swelling, drainage or pus sick medical attention promptly. May return. Meds ordered this encounter  Medications  . Tdap (BOOSTRIX) injection 0.5 mL       Janne Napoleon, NP 08/23/16 1827

## 2016-08-23 NOTE — ED Triage Notes (Signed)
The patient presented to the [facility] with a complaint of needing a TdaP injection after sticking herself with a pin today.

## 2016-09-24 ENCOUNTER — Encounter: Payer: Self-pay | Admitting: *Deleted

## 2016-09-24 ENCOUNTER — Ambulatory Visit (HOSPITAL_BASED_OUTPATIENT_CLINIC_OR_DEPARTMENT_OTHER): Payer: BLUE CROSS/BLUE SHIELD | Admitting: Adult Health

## 2016-09-24 ENCOUNTER — Other Ambulatory Visit: Payer: Self-pay | Admitting: Adult Health

## 2016-09-24 ENCOUNTER — Encounter: Payer: Self-pay | Admitting: Adult Health

## 2016-09-24 VITALS — BP 131/71 | HR 87 | Temp 98.1°F | Resp 17 | Ht 66.0 in | Wt 149.7 lb

## 2016-09-24 DIAGNOSIS — M7989 Other specified soft tissue disorders: Secondary | ICD-10-CM

## 2016-09-24 DIAGNOSIS — Z170 Estrogen receptor positive status [ER+]: Secondary | ICD-10-CM

## 2016-09-24 DIAGNOSIS — C50411 Malignant neoplasm of upper-outer quadrant of right female breast: Secondary | ICD-10-CM | POA: Diagnosis not present

## 2016-09-24 NOTE — Progress Notes (Signed)
CLINIC:  Survivorship   REASON FOR VISIT:  Routine follow-up post-treatment for a recent history of breast cancer.  BRIEF ONCOLOGIC HISTORY:    Breast cancer of upper-outer quadrant of right female breast ([place])   07/11/2015 Mammogram    Right breast mass in the axillary tail 1.7 x 1.5 x 1.3 cm axillary ultrasound negative, T1c N0 stage IA clinical stage      07/11/2015 Initial Diagnosis    Right breast biopsy 10:00 position: Invasive ductal carcinoma grade 3, ER 0%, P of 0%, Ki-67 95%, HER-2 negative ratio 1.21; right breast biopsy 12:00: Fibrocystic changes      08/02/2015 - 12/20/2015 Neo-Adjuvant Chemotherapy    Dose dense Adriamycin and Cytoxan 4 followed by Taxol, switched to Abraxane with cycle 2, completed 12 weekly cycles      08/15/2015 Genetic Testing    Genetic testing was normal, and did not reveal a deleterious mutation.  Additionally, no variants of uncertain significance (VUSes) were found.  Genes tested include: ATM, BARD1, BRCA1, BRCA2, BRIP1, CDH1, CHEK2, FANCC, MLH1, MSH2, MSH6, NBN, PALB2, PMS2, PTEN, RAD51C, RAD51D, TP53, and XRCC2.  This panel also includes deletion/duplication analysis (without sequencing) for one gene, EPCAM.       12/20/2015 Breast MRI    Radiologic complete response. 2 foci of enhancement 12:00 and 11:30 position previously biopsy-proven benign      01/08/2016 Surgery    Right lumpectomy: Focal ALH, fibrocystic change, no residual cancer, 0/7 lymph nodes negative, triple negative disease, complete pathologic response      01/30/2016 - 03/17/2016 Radiation Therapy    Adjuvant radiation therapy ([place]): 1. The right breast was treated to 50.4 Gy in 28 fractions at 1.8 Gy per fraction. 2. The right breast was boosted to 10 Gy in 5 fractions at 2 Gy per fraction.        INTERVAL HISTORY:  Ms. Hirt presents to the Survivorship Clinic today for our initial meeting to review her survivorship care plan detailing her treatment course for  breast cancer, as well as monitoring long-term side effects of that treatment, education regarding health maintenance, screening, and overall wellness and health promotion.     Overall, Ms. Ives reports douing well she did develop a lump under her right arm after golfing.  This has been there since this past Sunday.  It is painful to touch and tight.  She thinks she may have pulled a muscle.  She is seeing her GYN in September.    REVIEW OF SYSTEMS:  Review of Systems  Constitutional: Negative for appetite change, chills, fatigue, fever and unexpected weight change.  HENT:   Negative for hearing loss and lump/mass.   Eyes: Negative for eye problems and icterus.  Respiratory: Negative for chest tightness, cough and shortness of breath.   Cardiovascular: Negative for chest pain, leg swelling and palpitations.  Gastrointestinal: Negative for abdominal distention, abdominal pain, constipation, diarrhea, nausea and vomiting.  Endocrine: Negative for hot flashes.  Musculoskeletal: Negative for arthralgias.  Skin: Negative for itching and rash.  Hematological: Negative for adenopathy. Does not bruise/bleed easily.  Psychiatric/Behavioral: Negative for depression. The patient is not nervous/anxious.   Breast: Denies any new nodularity, masses, tenderness, nipple changes, or nipple discharge.      ONCOLOGY TREATMENT TEAM:  1. Surgeon:  Dr. Donne Hazel at [facility] 2. Medical Oncologist: Dr. Lindi Adie  3. Radiation Oncologist: Dr. Tammi Klippel    PAST MEDICAL/SURGICAL HISTORY:  Past Medical History:  Diagnosis Date  . Anxiety    not  currently  . Asthma   . Breast cancer ([place])   . Breast cancer of upper-outer quadrant of right female breast ([place]) 07/15/2015  . Eczema   . History of motion sickness   . Personal history of chemotherapy   . Personal history of radiation therapy   . PONV (postoperative nausea and vomiting)    Past Surgical History:  Procedure Laterality Date  .  BREAST BIOPSY Right 08/07/2015  . BREAST BIOPSY Right 07/11/2015  . BREAST LUMPECTOMY Right 01/08/2016  . DENTAL SURGERY  1982   impacted teeth  . PORT-A-CATH REMOVAL N/A 03/16/2016   Procedure: REMOVAL PORT-A-CATH;  Surgeon: Rolm Bookbinder, MD;  Location: [place];  Service: General;  Laterality: N/A;  . PORTACATH PLACEMENT Right 07/31/2015   Procedure: INSERTION PORT-A-CATH WITH ULTRASOUND ;  Surgeon: Rolm Bookbinder, MD;  Location: [facility];  Service: General;  Laterality: Right;  . RADIOACTIVE SEED GUIDED MASTECTOMY WITH AXILLARY SENTINEL LYMPH NODE BIOPSY Right 01/08/2016   Procedure: RADIOACTIVE SEED LUMPECTOMY  WITH AXILLARY SENTINEL LYMPH NODE BIOPSY AND BLUE DYE INJECTION;  Surgeon: Rolm Bookbinder, MD;  Location: [facility];  Service: General;  Laterality: Right;     ALLERGIES:  Allergies  Allergen Reactions  . Lanolin Itching  . Latex Itching and Swelling     CURRENT MEDICATIONS:  Outpatient Encounter Prescriptions as of 09/24/2016  Medication Sig  . albuterol (PROAIR HFA) 108 (90 BASE) MCG/ACT inhaler Inhale 2 puffs into the lungs every 6 (six) hours as needed for wheezing or shortness of breath. Reported on 08/23/2015  . budesonide-formoterol (SYMBICORT) 160-4.5 MCG/ACT inhaler Inhale 2 puffs into the lungs 2 (two) times daily.  . cetirizine (ZYRTEC) 10 MG tablet Take 10 mg by mouth daily as needed. Reported on 08/23/2015  . fluticasone (FLONASE) 50 MCG/ACT nasal spray PLACE 1 SPRAY INTO BOTH NOSTRILS DAILY.  . montelukast (SINGULAIR) 10 MG tablet TAKE 1 TABLET (10 MG TOTAL) BY MOUTH AT BEDTIME.  . non-metallic deodorant Jethro Poling) MISC Apply 1 application topically daily as needed.   No facility-administered encounter medications on file as of 09/24/2016.      ONCOLOGIC FAMILY HISTORY:  Family History  Problem Relation Age of Onset  . Colon polyps Mother        hx of one large colon polyp s/p resection  . Other Mother        hx of benign teratoma and  molar pregnancy  . Prostate cancer Father 28  . Colon polyps Father        possible colon polyp hx; +diverticulitis  . Diverticulitis Father   . Other Maternal Uncle        hx of unspecified "lump on his back"  . Lupus Paternal Aunt   . Heart attack Paternal Uncle        d. 85s; smoker  . Heart Problems Paternal Uncle   . Colon cancer Maternal Grandmother 63       possible colon cancer  . Lung cancer Maternal Grandfather 85       smoker; +EtOH  . COPD Paternal Grandmother   . Heart Problems Paternal Grandmother   . Heart Problems Paternal Grandfather   . Prostate cancer Paternal Grandfather        possible prostate cancer dx. early 35s  . Stomach cancer Other        maternal great grandmother (MGF's mother); dx. late 39s  . Colon cancer Other 37       paternal great grandmother (PGM's mother)  . Stomach cancer  Other        paternal great uncles (PGF's brother) d. late 67s-early 60s     GENETIC COUNSELING/TESTING: See above  SOCIAL HISTORY:  ABRAR KOONE is separated and lives with her sons in [place], [place].   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  She denies any current or history of tobacco, alcohol, or illicit drug use.     PHYSICAL EXAMINATION:  Vital Signs:   Vitals:   09/24/16 0906  BP: 131/71  Pulse: 87  Resp: 17  Temp: 98.1 F (36.7 C)   Filed Weights   09/24/16 0906  Weight: 149 lb 11.2 oz (67.9 kg)   General: Well-nourished, well-appearing female in no acute distress.  She is unaccompanied today.   HEENT: Head is normocephalic.  Pupils equal and reactive to light. Conjunctivae clear without exudate.  Sclerae anicteric. Oral mucosa is pink, moist.  Oropharynx is pink without lesions or erythema.  Lymph: No cervical, supraclavicular, or infraclavicular lymphadenopathy noted on palpation.  Cardiovascular: Regular rate and rhythm.Marland Kitchen Respiratory: Clear to auscultation bilaterally.  Chest expansion symmetric; breathing non-labored.  Breast: right axillary tail with tightness and small nodule, right breast without any thickness, nodules/masses GI: Abdomen soft and round; non-tender, non-distended. Bowel sounds normoactive.  GU: Deferred.  Neuro: No focal deficits. Steady gait.  Psych: Mood and affect normal and appropriate for situation.  Extremities: No edema. MSK: No focal spinal tenderness to palpation.  Full range of motion in bilateral upper extremities Skin: Warm and dry.  LABORATORY DATA:  None for this visit.  DIAGNOSTIC IMAGING:  None for this visit.     ASSESSMENT AND PLAN:  Ms.. Galeno is a pleasant 51 y.o. female with Stage IA right breast invasive ductal carcinoma, ER-/PR-/HER2-, diagnosed in 06/2015, treated with neo-adjuvant chemotherapy, lumpectomy, and adjuvant radiation therapy.  She presents to the Survivorship Clinic for our initial meeting and routine follow-up post-completion of treatment for breast cancer.    1. Stage IA right breast cancer:  Ms. Duzan is continuing to recover from definitive treatment for breast cancer. She will follow-up with her medical oncologist, Dr. Lindi Adie in April, 2019 with history and physical exam per surveillance protocol.  Today, a comprehensive survivorship care plan and treatment summary was reviewed with the patient today detailing her breast cancer diagnosis, treatment course, potential late/long-term effects of treatment, appropriate follow-up care with recommendations for the future, and patient education resources.  A copy of this summary, along with a letter will be sent to the patient's primary care provider via mail/fax/In Basket message after today's visit.    2. Right axillary pain/nodule: Will get an ultrasound to r/o recurrence.    3.  Muscle pain/tightness: I recommended Yoga and exercise as likely musculoskeletal.  Should this pain continue despite exercise and stretching, would order a bone scan.  4.  Bone health:  Given Ms. Waltermire history of breast cancer she is at slight risk for bone demineralization. She was given education on specific activities to promote bone health.  5. Cancer screening:  Due to Ms. Perras history and her age, she should receive screening for skin cancers, colon cancer, and gynecologic cancers.  The information and recommendations are listed on the patient's comprehensive care plan/treatment summary and were reviewed in detail with the patient.    6. Health maintenance and wellness promotion: Ms. Espinola was encouraged to consume 5-7 servings of fruits and vegetables per day. We reviewed the "Nutrition Rainbow" handout,  as well as the handout "Take Control of Your Health and Reduce Your Cancer Risk" from the [employer].  She was also encouraged to engage in moderate to vigorous exercise for 30 minutes per day most days of the week. We discussed the [employer] fitness program, which is designed for cancer survivors to help them become more physically fit after cancer treatments.  She was instructed to limit her alcohol consumption and continue to abstain from tobacco use.     7. Support services/counseling: It is not uncommon for this period of the patient's cancer care trajectory to be one of many emotions and stressors.  We discussed an opportunity for her to participate in the next session of [facility] ("Finding Your New Normal") support group series designed for patients after they have completed treatment.   Ms. Borgmeyer was encouraged to take advantage of our many other support services programs, support groups, and/or counseling in coping with her new life as a cancer survivor after completing anti-cancer treatment.  She was offered support today through active listening and expressive supportive counseling.  She was given information regarding our available services and encouraged to contact me with any questions or for help enrolling in any of our support  group/programs.    Dispo:   -Return to cancer center for follow up with Dr. Lindi Adie in 06/2017 -Mammogram due in 06/2017 -Follow up with surgery 12/2016 -She is welcome to return back to the Survivorship Clinic at any time; no additional follow-up needed at this time.  -Consider referral back to survivorship as a long-term survivor for continued surveillance  A total of (30) minutes of face-to-face time was spent with this patient with greater than 50% of that time in counseling and care-coordination.   Gardenia Phlegm, NP Survivorship Program [place] [phone]   Note: PRIMARY CARE PROVIDER Vania Rea, [place] [phone]

## 2016-09-25 ENCOUNTER — Other Ambulatory Visit: Payer: Self-pay | Admitting: Adult Health

## 2016-09-25 ENCOUNTER — Ambulatory Visit
Admission: RE | Admit: 2016-09-25 | Discharge: 2016-09-25 | Disposition: A | Discharge status: Home / Self Care | Payer: BLUE CROSS/BLUE SHIELD | Source: Ambulatory Visit | Attending: Adult Health | Admitting: Adult Health

## 2016-09-25 ENCOUNTER — Other Ambulatory Visit: Payer: BLUE CROSS/BLUE SHIELD

## 2016-09-25 DIAGNOSIS — M7989 Other specified soft tissue disorders: Secondary | ICD-10-CM

## 2016-09-25 DIAGNOSIS — Z170 Estrogen receptor positive status [ER+]: Secondary | ICD-10-CM

## 2016-09-25 DIAGNOSIS — C50411 Malignant neoplasm of upper-outer quadrant of right female breast: Secondary | ICD-10-CM

## 2016-11-12 ENCOUNTER — Encounter: Payer: Self-pay | Admitting: Pharmacist

## 2016-11-12 DIAGNOSIS — Z170 Estrogen receptor positive status [ER+]: Secondary | ICD-10-CM

## 2016-11-12 DIAGNOSIS — C50411 Malignant neoplasm of upper-outer quadrant of right female breast: Secondary | ICD-10-CM

## 2016-11-12 NOTE — Progress Notes (Signed)
Telephone documentation  Study code: [id number]  Spoke with patient over the phone today and reviewed with her the pharmacogenetic information listed below for the four genes of interest of the "Pharmacogenetic analysis of toxicities related to administration of taxanes in breast cancer patients" study. The information below was reviewed with the patient. Offered the patient the option of speaking with a [facility] genetic counselor and she was not interested in making an appointment.   **The buccal swab testing was NOT conducted at a CLIA validated lab. The patient was reminded that the information given was for informational proposes only and should NOT be used to make clinical decisions.   Gene Phenotype  CYP3A4 Normal metabolizer   CYP3A5 Poor metabolizer   SLCO1B1 Intermediate function  ABCB1 Normal function    Darl Pikes, PharmD, BCPS Hematology/Oncology Clinical Pharmacist [facility] [phone]

## 2016-11-26 ENCOUNTER — Other Ambulatory Visit: Payer: Self-pay | Admitting: *Deleted

## 2016-11-26 ENCOUNTER — Telehealth: Payer: Self-pay | Admitting: *Deleted

## 2016-11-26 DIAGNOSIS — Z171 Estrogen receptor negative status [ER-]: Secondary | ICD-10-CM

## 2016-11-26 DIAGNOSIS — C50411 Malignant neoplasm of upper-outer quadrant of right female breast: Secondary | ICD-10-CM

## 2016-11-26 NOTE — Telephone Encounter (Signed)
Received email from patient complaining of continued pelvic pain and joint pain.  She has been trying yoga and meditation and hasn't been helping.  Per Mendel Ryder order bone scan. Patient aware.

## 2016-12-21 ENCOUNTER — Encounter (HOSPITAL_COMMUNITY)
Admission: RE | Admit: 2016-12-21 | Discharge: 2016-12-21 | Disposition: A | Discharge status: Home / Self Care | Payer: BLUE CROSS/BLUE SHIELD | Source: Ambulatory Visit | Attending: Adult Health | Admitting: Adult Health

## 2016-12-21 DIAGNOSIS — C50411 Malignant neoplasm of upper-outer quadrant of right female breast: Secondary | ICD-10-CM | POA: Insufficient documentation

## 2016-12-21 DIAGNOSIS — Z171 Estrogen receptor negative status [ER-]: Secondary | ICD-10-CM | POA: Insufficient documentation

## 2016-12-21 MED ORDER — TECHNETIUM TC 99M MEDRONATE IV KIT
25.0000 | PACK | Freq: Once | INTRAVENOUS | Status: AC | PRN
  Administered 2016-12-21: 19.5 via INTRAVENOUS

## 2016-12-24 ENCOUNTER — Other Ambulatory Visit (HOSPITAL_COMMUNITY): Payer: BLUE CROSS/BLUE SHIELD

## 2016-12-24 ENCOUNTER — Ambulatory Visit (HOSPITAL_COMMUNITY): Payer: BLUE CROSS/BLUE SHIELD

## 2017-01-25 ENCOUNTER — Ambulatory Visit (INDEPENDENT_AMBULATORY_CARE_PROVIDER_SITE_OTHER): Payer: BLUE CROSS/BLUE SHIELD | Admitting: Physician Assistant

## 2017-01-25 ENCOUNTER — Encounter: Payer: Self-pay | Admitting: Physician Assistant

## 2017-01-25 VITALS — BP 124/74 | HR 90 | Temp 98.6°F | Resp 18 | Ht 66.3 in | Wt 147.4 lb

## 2017-01-25 DIAGNOSIS — H9191 Unspecified hearing loss, right ear: Secondary | ICD-10-CM

## 2017-01-25 DIAGNOSIS — R42 Dizziness and giddiness: Secondary | ICD-10-CM

## 2017-01-25 MED ORDER — PREDNISONE 10 MG PO TABS: ORAL_TABLET | ORAL | 0 refills | Status: DC

## 2017-01-25 MED ORDER — MECLIZINE HCL 32 MG PO TABS: 32.0000 mg | ORAL_TABLET | Freq: Three times a day (TID) | ORAL | 0 refills | Status: DC | PRN

## 2017-01-25 NOTE — Progress Notes (Deleted)
   Courtney Gomez  MRN: [id number] DOB: 1965/12/27  Subjective:  Courtney Gomez is a 51 y.o. female seen in office today for a chief complaint of ***  Review of Systems  Patient Active Problem List   Diagnosis Date Noted  . Pruritus 06/30/2016  . Hypersensitivity reaction 10/04/2015  . Genetic testing 09/09/2015  . Breast cancer of upper-outer quadrant of right female breast ([place]) 07/15/2015  . Moderate persistent asthma 02/25/2015  . Allergic rhinitis 02/25/2015  . HIP PAIN, BILATERAL 07/26/2008  . ECZEMA, ATOPIC DERMATITIS 05/27/2006    Current Outpatient Prescriptions on File Prior to Visit  Medication Sig Dispense Refill  . albuterol (PROAIR HFA) 108 (90 BASE) MCG/ACT inhaler Inhale 2 puffs into the lungs every 6 (six) hours as needed for wheezing or shortness of breath. Reported on 08/23/2015    . budesonide-formoterol (SYMBICORT) 160-4.5 MCG/ACT inhaler Inhale 2 puffs into the lungs 2 (two) times daily. 1 Inhaler 5  . cetirizine (ZYRTEC) 10 MG tablet Take 10 mg by mouth daily as needed. Reported on 08/23/2015    . fluticasone (FLONASE) 50 MCG/ACT nasal spray PLACE 1 SPRAY INTO BOTH NOSTRILS DAILY. 16 g 2  . montelukast (SINGULAIR) 10 MG tablet TAKE 1 TABLET (10 MG TOTAL) BY MOUTH AT BEDTIME. 90 tablet 1  . non-metallic deodorant (ALRA) MISC Apply 1 application topically daily as needed.     No current facility-administered medications on file prior to visit.     Allergies  Allergen Reactions  . Lanolin Itching  . Latex Itching and Swelling     Objective:  There were no vitals taken for this visit.  Physical Exam  Assessment and Plan :  *** There are no diagnoses linked to this encounter.   Tenna Delaine PA-C  [facility] 01/25/2017 2:08 PM

## 2017-01-25 NOTE — Patient Instructions (Addendum)
I have contacted an ear nose and throat doctor at [facility] and he recommends that you start a prednisone pack today.  Use Antivert as needed.  He would like you to follow-up in their office on Monday.  You can call today and schedule the appointment for Monday.  I have also placed a referral in case they need that on our end.  Please seek care immediately if you develop any worsening symptoms or any new onset vomiting, complete hearing loss, numbness, tingling, or slurred speech.  Thank you for letting me participate in your health and well being.   [facility]  [address] [place], [place] [address]  [phone]     Hearing Loss Hearing loss is a partial or total loss of the ability to hear. This can be temporary or permanent, and it can happen in one or both ears. Hearing loss may be referred to as deafness. Medical care is necessary to treat hearing loss properly and to prevent the condition from getting worse. Your hearing may partially or completely come back, depending on what caused your hearing loss and how severe it is. In some cases, hearing loss is permanent. What are the causes? Common causes of hearing loss include:  Too much wax in the ear canal.  Infection of the ear canal or middle ear.  Fluid in the middle ear.  Injury to the ear or surrounding area.  An object stuck in the ear.  Prolonged exposure to loud sounds, such as music.  Less common causes of hearing loss include:  Tumors in the ear.  Viral or bacterial infections, such as meningitis.  A hole in the eardrum (perforated eardrum).  Problems with the hearing nerve that sends signals between the brain and the ear.  Certain medicines.  What are the signs or symptoms? Symptoms of this condition may include:  Difficulty telling the difference between sounds.  Difficulty following a conversation when there is background noise.  Lack of response  to sounds in your environment. This may be most noticeable when you do not respond to startling sounds.  Needing to turn up the volume on the television, radio, etc.  Ringing in the ears.  Dizziness.  Pain in the ears.  How is this diagnosed? This condition is diagnosed based on a physical exam and a hearing test (audiometry). The audiometry test will be performed by a hearing specialist (audiologist). You may also be referred to an ear, nose, and throat (ENT) specialist (otolaryngologist). How is this treated? Treatment for recent onset of hearing loss may include:  Ear wax removal.  Being prescribed medicines to prevent infection (antibiotics).  Being prescribed medicines to reduce inflammation (corticosteroids).  Follow these instructions at home:  If you were prescribed an antibiotic medicine, take it as told by your health care provider. Do not stop taking the antibiotic even if you start to feel better.  Take over-the-counter and prescription medicines only as told by your health care provider.  Avoid loud noises.  Return to your normal activities as told by your health care provider. Ask your health care provider what activities are safe for you.  Keep all follow-up visits as told by your health care provider. This is important. Contact a health care provider if:  You feel dizzy.  You develop new symptoms.  You vomit or feel nauseous.  You have a fever. Get help right away if:  You develop sudden changes in your vision.  You have severe ear pain.  You have new or increased weakness.  You have a severe headache. This information is not intended to replace advice given to you by your health care provider. Make sure you discuss any questions you have with your health care provider. Document Released: 03/16/2005 Document Revised: 08/22/2015 Document Reviewed: 08/01/2014 Elsevier Interactive Patient Education  2018 [employer].  Vertigo Vertigo means that  you feel like you are moving when you are not. Vertigo can also make you feel like things around you are moving when they are not. This feeling can come and go at any time. Vertigo often goes away on its own. Follow these instructions at home:  Avoid making fast movements.  Avoid driving.  Avoid using heavy machinery.  Avoid doing any task or activity that might cause danger to you or other people if you would have a vertigo attack while you are doing it.  Sit down right away if you feel dizzy or have trouble with your balance.  Take over-the-counter and prescription medicines only as told by your doctor.  Follow instructions from your doctor about which positions or movements you should avoid.  Drink enough fluid to keep your pee (urine) clear or pale yellow.  Keep all follow-up visits as told by your doctor. This is important. Contact a doctor if:  Medicine does not help your vertigo.  You have a fever.  Your problems get worse or you have new symptoms.  Your family or friends see changes in your behavior.  You feel sick to your stomach (nauseous) or you throw up (vomit).  You have a "pins and needles" feeling or you are numb in part of your body. Get help right away if:  You have trouble moving or talking.  You are always dizzy.  You pass out (faint).  You get very bad headaches.  You feel weak or have trouble using your hands, arms, or legs.  You have changes in your hearing.  You have changes in your seeing (vision).  You get a stiff neck.  Bright light starts to bother you. This information is not intended to replace advice given to you by your health care provider. Make sure you discuss any questions you have with your health care provider. Document Released: 12/24/2007 Document Revised: 08/22/2015 Document Reviewed: 07/09/2014 Elsevier Interactive Patient Education  2018 [employer].    IF you received an x-ray today, you will receive an invoice  from [facility]. Please contact [facility] at [phone] with questions or concerns regarding your invoice.   IF you received labwork today, you will receive an invoice from [facility]. Please contact [facility] at [phone] with questions or concerns regarding your invoice.   Our billing staff will not be able to assist you with questions regarding bills from these companies.  You will be contacted with the lab results as soon as they are available. The fastest way to get your results is to activate your My Chart account. Instructions are located on the last page of this paperwork. If you have not heard from [place] regarding the results in 2 weeks, please contact this office.

## 2017-01-25 NOTE — Progress Notes (Deleted)
    MRN: [id number] DOB: November 05, 1965  Subjective:   Courtney Gomez is a 51 y.o. female presenting for chief complaint of Ear Fullness (X 3 days) .  Reports *** history of {URI Symptoms :210800001}, {Systemic Symptoms:[phone]}. Has tried *** relief. Denies ***fever, {URI Symptoms :210800001}, {Systemic Symptoms:[phone]}. Has *** had *** sick contact with ***. *** history of seasonal allergies, history of asthma. Patient *** flu shot this season. *** smoking, *** alcohol. Denies any other aggravating or relieving factors, no other questions or concerns.  Courtney Gomez has a current medication list which includes the following prescription(s): albuterol, budesonide-formoterol, cetirizine, fluticasone, montelukast, and non-metallic deodorant. Also is allergic to lanolin and latex.  Courtney Gomez  has a past medical history of Anxiety; Asthma; Breast cancer ([place]); Breast cancer of upper-outer quadrant of right female breast ([place]) (07/15/2015); Eczema; History of motion sickness; Personal history of chemotherapy; Personal history of radiation therapy; and PONV (postoperative nausea and vomiting). Also  has a past surgical history that includes Dental surgery (1982); Portacath placement (Right, 07/31/2015); Radioactive seed guided mastectomy with axillary sentinel lymph node biopsy (Right, 01/08/2016); Port-a-cath removal (N/A, 03/16/2016); Breast lumpectomy (Right, 01/08/2016); Breast biopsy (Right, 08/07/2015); and Breast biopsy (Right, 07/11/2015).   Objective:   Vitals: BP 124/74 (BP Location: Left Arm, Patient Position: Sitting, Cuff Size: Normal)   Pulse 90   Temp 98.6 F (37 C) (Oral)   Resp 18   Ht 5' 6.3" (1.684 m)   Wt 147 lb 6.4 oz (66.9 kg)   SpO2 99%   BMI 23.58 kg/m   Physical Exam  No results found for this or any previous visit (from the past 24 hour(s)).  Assessment and Plan :  There are no diagnoses linked to this encounter.  Tenna Delaine, PA-C  [facility] 01/25/2017 2:15 PM

## 2017-01-25 NOTE — Progress Notes (Signed)
MRN: [id number] DOB: 12/26/1965  Subjective:   Courtney Gomez is a 51 y.o. female presenting for chief complaint of Ear Fullness (X 3 days) .  Reports 5 day history of sudden onset muffled hearing of right ear. Feels like she is hearing out of a "sea shell" in her right ear. Notes it is not pulsatile in nature. It is intermittent. Occurred once 5 days ago and then went away and now it returned and is constant. Had a few episodes of slight dizziness over the past few days.Notes she has a hx of motion sickness and never likes her head to move too much. Has not tried anything for relief. Denies ear trauma,fever, sinus pain, rhinorrhea, itchy watery eyes, ear pain, ear drainage, sore throat, myalgia and cough, night sweats, chills, fatigue, nausea, vomiting, abdominal pain and diarrhea.  Has history of seasonal allergies.Takes zyrtec, Flonase, and Singulair daily. Patient has had flu shot this season. Denies smoking. Denies any other aggravating or relieving factors, no other questions or concerns.  Courtney Gomez has a current medication list which includes the following prescription(s): albuterol, budesonide-formoterol, cetirizine, fluticasone, montelukast, and non-metallic deodorant. Also is allergic to lanolin and latex.  Courtney Gomez  has a past medical history of Anxiety; Asthma; Breast cancer ([place]); Breast cancer of upper-outer quadrant of right female breast ([place]) (07/15/2015); Eczema; History of motion sickness; Personal history of chemotherapy; Personal history of radiation therapy; and PONV (postoperative nausea and vomiting). Also  has a past surgical history that includes Dental surgery (1982); Portacath placement (Right, 07/31/2015); Radioactive seed guided mastectomy with axillary sentinel lymph node biopsy (Right, 01/08/2016); Port-a-cath removal (N/A, 03/16/2016); Breast lumpectomy (Right, 01/08/2016); Breast biopsy (Right, 08/07/2015); and Breast biopsy (Right, 07/11/2015).   Objective:    Vitals: BP 124/74 (BP Location: Left Arm, Patient Position: Sitting, Cuff Size: Normal)   Pulse 90   Temp 98.6 F (37 C) (Oral)   Resp 18   Ht 5' 6.3" (1.684 m)   Wt 147 lb 6.4 oz (66.9 kg)   SpO2 99%   BMI 23.58 kg/m   Physical Exam  Constitutional: She is oriented to person, place, and time. She appears well-developed and well-nourished.  HENT:  Head: Normocephalic and atraumatic.  Right Ear: Tympanic membrane, external ear and ear canal normal. Tympanic membrane is not erythematous and not bulging. No middle ear effusion.  Left Ear: Tympanic membrane, external ear and ear canal normal. Tympanic membrane is not erythematous and not bulging.  No middle ear effusion.  Nose: Mucosal edema (mild noted bilaterally) present. No rhinorrhea. Right sinus exhibits no maxillary sinus tenderness and no frontal sinus tenderness. Left sinus exhibits no maxillary sinus tenderness and no frontal sinus tenderness.  Mouth/Throat: Uvula is midline, oropharynx is clear and moist and mucous membranes are normal.  Weber lateralizes to unaffected ear.  Rinne right ear: AC>BC, bone conduction not heard in right ear but heard in left ear Rinne left ear: AC>BC  Passed whisper test in both ears  Eyes: Pupils are equal, round, and reactive to light. Conjunctivae and EOM are normal.  Neck: Normal range of motion.  Cardiovascular: Normal rate, regular rhythm, normal heart sounds and intact distal pulses.   Pulmonary/Chest: Effort normal and breath sounds normal. She has no wheezes. She has no rhonchi. She has no rales.  Lymphadenopathy:       Head (right side): No submental, no submandibular, no tonsillar, no preauricular, no posterior auricular and no occipital adenopathy present.       Head (left side):  No submental, no submandibular, no tonsillar, no preauricular, no posterior auricular and no occipital adenopathy present.    She has no cervical adenopathy.       Right: No supraclavicular adenopathy  present.       Left: No supraclavicular adenopathy present.  Neurological: She is alert and oriented to person, place, and time. No cranial nerve deficit or sensory deficit. She displays a negative Romberg sign.  Reflex Scores:      Tricep reflexes are 2+ on the right side and 2+ on the left side.      Bicep reflexes are 2+ on the right side and 2+ on the left side.      Brachioradialis reflexes are 2+ on the right side and 2+ on the left side.      Patellar reflexes are 2+ on the right side and 2+ on the left side.      Achilles reflexes are 2+ on the right side and 2+ on the left side. Normal FNF and tandem walk. Dix Hallpike elicited dizziness to the right, no nystagmus noted.    Skin: Skin is warm and dry.  Psychiatric: She has a normal mood and affect.  Vitals reviewed.  No results found for this or any previous visit (from the past 24 hour(s)).  Epley Maneuver performed. Pt admits some improvement in dizziness post Epley Maneuver.  Assessment and Plan :  This case is precepted with Dr. Tamala Julian, who recommended I contact ENT for further recommendations.  [facility] then[facility] and discussed patient's case with Dr. Wilburn Cornelia.  He recommended that I start a prednisone taper, along with Antivert as needed.  He recommended that patient follow-up in their office no later than  Monday of next week.  Patient given [facility] contact information to call and schedule the appointment. I have also placed urgent referral in case they need the referral. Pt voices understanding and agrees to plan. Given strict ED precautions. 1. Hearing loss of right ear, unspecified hearing loss type - predniSONE (DELTASONE) 10 MG tablet; 6-5-4-3-2-1 taper. Take all the tablets for that day in the am with food.  Dispense: 21 tablet; Refill: 0 - Ambulatory referral to ENT 2.Dizziness - meclizine (ANTIVERT) 32 MG tablet; Take 1 tablet (32 mg total) by mouth 3 (three) times daily as needed.  Dispense: 30 tablet;  Refill: 0  A total of 25 was spent in the room with the patient, greater than 50% of which was in counseling/coordination of care regarding hearing loss and dizziness.  Tenna Delaine, PA-C  [facility] 01/25/2017 7:45 PM

## 2017-02-16 ENCOUNTER — Other Ambulatory Visit: Payer: Self-pay | Admitting: Allergy and Immunology

## 2017-02-19 ENCOUNTER — Encounter: Payer: Self-pay | Admitting: Hematology and Oncology

## 2017-02-23 ENCOUNTER — Other Ambulatory Visit: Payer: Self-pay | Admitting: Otolaryngology

## 2017-02-23 DIAGNOSIS — H9121 Sudden idiopathic hearing loss, right ear: Secondary | ICD-10-CM

## 2017-03-01 ENCOUNTER — Encounter: Payer: Self-pay | Admitting: Hematology and Oncology

## 2017-03-01 ENCOUNTER — Other Ambulatory Visit: Payer: Self-pay | Admitting: Hematology and Oncology

## 2017-03-01 DIAGNOSIS — C50411 Malignant neoplasm of upper-outer quadrant of right female breast: Secondary | ICD-10-CM

## 2017-03-01 DIAGNOSIS — Z170 Estrogen receptor positive status [ER+]: Secondary | ICD-10-CM

## 2017-03-02 ENCOUNTER — Ambulatory Visit (INDEPENDENT_AMBULATORY_CARE_PROVIDER_SITE_OTHER): Payer: BLUE CROSS/BLUE SHIELD | Admitting: Allergy and Immunology

## 2017-03-02 ENCOUNTER — Encounter: Payer: Self-pay | Admitting: Allergy and Immunology

## 2017-03-02 VITALS — BP 124/86 | HR 92 | Resp 18

## 2017-03-02 DIAGNOSIS — J3089 Other allergic rhinitis: Secondary | ICD-10-CM

## 2017-03-02 DIAGNOSIS — J4540 Moderate persistent asthma, uncomplicated: Secondary | ICD-10-CM | POA: Diagnosis not present

## 2017-03-02 MED ORDER — FLUTICASONE PROPIONATE HFA 110 MCG/ACT IN AERO: 2.0000 | INHALATION_SPRAY | Freq: Two times a day (BID) | RESPIRATORY_TRACT | 5 refills | Status: DC

## 2017-03-02 MED ORDER — MONTELUKAST SODIUM 10 MG PO TABS: ORAL_TABLET | ORAL | 5 refills | Status: DC

## 2017-03-02 NOTE — Patient Instructions (Signed)
Moderate persistent asthma  Switch from Advair 250/50, 1 inhalation daily, to Flovent 110 g, 2 inhalations via spacer device twice daily.  A sample and prescription have been provided for Flovent 110 g.  For now, continue montelukast 10 mg daily bedtime and albuterol HFA, 1-2 inhalations every 4-6 hours if needed.  Subjective and objective measures of pulmonary function will be followed and the treatment plan will be adjusted accordingly.  Allergic rhinitis Stable.  Continue appropriate allergen avoidance measures, cetirizine as needed, fluticasone nasal spray as needed, and nasal saline irrigation as needed.   Return in about 5 months (around 07/31/2017), or if symptoms worsen or fail to improve.

## 2017-03-02 NOTE — Progress Notes (Signed)
Follow-up Note  RE: Courtney Gomez MRN: [id number] DOB: 1965/12/06 Date of Office Visit: 03/02/2017  Primary care provider: Vania Rea, MD Referring provider: Vania Rea, MD  History of present illness: Courtney Gomez is a 51 y.o. female with moderate persistent asthma, allergic rhinitis, and history of pruritus presenting today for follow-up.  She was last seen in this clinic on June 30, 2016.  She reports that in the interval since her previous visit her asthma has been well controlled with Advair 250/50, 1 inhalation daily.  Over the past several months she has not required asthma rescue medication, experienced nocturnal awakenings due to lower respiratory symptoms, nor have activities of daily living been limited.  She has no nasal symptom complaints today.   Assessment and plan: Moderate persistent asthma  Switch from Advair 250/50, 1 inhalation daily, to Flovent 110 g, 2 inhalations via spacer device twice daily.  A sample and prescription have been provided for Flovent 110 g.  For now, continue montelukast 10 mg daily bedtime and albuterol HFA, 1-2 inhalations every 4-6 hours if needed.  Subjective and objective measures of pulmonary function will be followed and the treatment plan will be adjusted accordingly.  Allergic rhinitis Stable.  Continue appropriate allergen avoidance measures, cetirizine as needed, fluticasone nasal spray as needed, and nasal saline irrigation as needed.   Meds ordered this encounter  Medications  . montelukast (SINGULAIR) 10 MG tablet    Sig: TAKE 1 TABLET BY MOUTH EVERYDAY AT BEDTIME    Dispense:  30 tablet    Refill:  5  . fluticasone (FLOVENT HFA) 110 MCG/ACT inhaler    Sig: Inhale 2 puffs into the lungs 2 (two) times daily.    Dispense:  1 Inhaler    Refill:  5    Diagnostics: Amatory reveals an FVC of 3.52 L and an FEV1 of 2.25 L, FEV1 ratio of 76%.  FEV1 is relatively unchanged compared to previous study.  Please see  scanned spirometry results for details.    Physical examination: Blood pressure 124/86, pulse 92, resp. rate 18, SpO2 95 %.  General: Alert, interactive, in no acute distress. HEENT: TMs pearly gray, turbinates minimally edematous without discharge, post-pharynx unremarkable. Neck: Supple without lymphadenopathy. Lungs: Mildly decreased breath sounds bilaterally without wheezing, rhonchi or rales. CV: Normal S1, S2 without murmurs. Skin: Warm and dry, without lesions or rashes.  The following portions of the patient's history were reviewed and updated as appropriate: allergies, current medications, past family history, past medical history, past social history, past surgical history and problem list.  Allergies as of 03/02/2017      Reactions   Lanolin Itching   Latex Itching, Swelling      Medication List        Accurate as of 03/02/17  6:04 PM. Always use your most recent med list.          budesonide-formoterol 160-4.5 MCG/ACT inhaler Commonly known as:  SYMBICORT Inhale 2 puffs into the lungs 2 (two) times daily.   cetirizine 10 MG tablet Commonly known as:  ZYRTEC Take 10 mg by mouth daily as needed. Reported on 08/23/2015   fluticasone 110 MCG/ACT inhaler Commonly known as:  FLOVENT HFA Inhale 2 puffs into the lungs 2 (two) times daily.   fluticasone 50 MCG/ACT nasal spray Commonly known as:  FLONASE PLACE 1 SPRAY INTO BOTH NOSTRILS DAILY.   montelukast 10 MG tablet Commonly known as:  SINGULAIR TAKE 1 TABLET BY MOUTH EVERYDAY AT BEDTIME   non-metallic  deodorant Misc Commonly known as:  ALRA Apply 1 application topically daily as needed.   predniSONE 10 MG tablet Commonly known as:  DELTASONE Take 10 mg by mouth daily with breakfast. 15 Dose pack   PROAIR HFA 108 (90 Base) MCG/ACT inhaler Generic drug:  albuterol Inhale 2 puffs into the lungs every 6 (six) hours as needed for wheezing or shortness of breath. Reported on 08/23/2015       Allergies    Allergen Reactions  . Lanolin Itching  . Latex Itching and Swelling    I appreciate the opportunity to take part in [facility] care. Please do not hesitate to contact me with questions.  Sincerely,   R. Edgar Frisk, MD

## 2017-03-02 NOTE — Assessment & Plan Note (Addendum)
   Switch from Advair 250/50, 1 inhalation daily, to Flovent 110 g, 2 inhalations via spacer device twice daily.  A sample and prescription have been provided for Flovent 110 g.  For now, continue montelukast 10 mg daily bedtime and albuterol HFA, 1-2 inhalations every 4-6 hours if needed.  Subjective and objective measures of pulmonary function will be followed and the treatment plan will be adjusted accordingly.

## 2017-03-02 NOTE — Assessment & Plan Note (Signed)
Stable.  Continue appropriate allergen avoidance measures, cetirizine as needed, fluticasone nasal spray as needed, and nasal saline irrigation as needed.

## 2017-03-03 ENCOUNTER — Ambulatory Visit
Admission: RE | Admit: 2017-03-03 | Discharge: 2017-03-03 | Disposition: A | Discharge status: Home / Self Care | Payer: BLUE CROSS/BLUE SHIELD | Source: Ambulatory Visit | Attending: Otolaryngology | Admitting: Otolaryngology

## 2017-03-03 ENCOUNTER — Encounter: Payer: Self-pay | Admitting: Hematology and Oncology

## 2017-03-03 DIAGNOSIS — H9121 Sudden idiopathic hearing loss, right ear: Secondary | ICD-10-CM

## 2017-03-03 MED ORDER — GADOBENATE DIMEGLUMINE 529 MG/ML IV SOLN
13.0000 mL | Freq: Once | INTRAVENOUS | Status: AC | PRN
  Administered 2017-03-03: 13 mL via INTRAVENOUS

## 2017-03-10 ENCOUNTER — Other Ambulatory Visit (HOSPITAL_COMMUNITY): Payer: Self-pay | Admitting: Otolaryngology

## 2017-03-10 DIAGNOSIS — I6523 Occlusion and stenosis of bilateral carotid arteries: Secondary | ICD-10-CM

## 2017-03-12 ENCOUNTER — Ambulatory Visit (HOSPITAL_COMMUNITY)
Admission: RE | Admit: 2017-03-12 | Discharge: 2017-03-12 | Disposition: A | Discharge status: Home / Self Care | Payer: BLUE CROSS/BLUE SHIELD | Source: Ambulatory Visit | Attending: Obstetrics & Gynecology | Admitting: Obstetrics & Gynecology

## 2017-03-12 DIAGNOSIS — I6523 Occlusion and stenosis of bilateral carotid arteries: Secondary | ICD-10-CM | POA: Diagnosis present

## 2017-03-12 NOTE — Progress Notes (Signed)
*  PRELIMINARY RESULTS* Vascular Ultrasound Carotid Duplex (Doppler) has been completed.  Preliminary findings: Bilateral 1-39% ICA stenosis, antegrade vertebral flow.  Left common, external and internal carotid arteries appear smaller in caliber than the right carotid system but with no hemodynamically significant stenosis.   Everrett Coombe 03/12/2017, 11:49 AM

## 2017-04-05 ENCOUNTER — Ambulatory Visit: Payer: BLUE CROSS/BLUE SHIELD | Admitting: Physical Therapy

## 2017-05-24 ENCOUNTER — Other Ambulatory Visit: Payer: Self-pay | Admitting: Hematology and Oncology

## 2017-05-24 DIAGNOSIS — Z853 Personal history of malignant neoplasm of breast: Secondary | ICD-10-CM

## 2017-06-07 ENCOUNTER — Other Ambulatory Visit: Payer: Self-pay

## 2017-06-07 ENCOUNTER — Encounter: Payer: Self-pay | Admitting: Physician Assistant

## 2017-06-07 ENCOUNTER — Ambulatory Visit (INDEPENDENT_AMBULATORY_CARE_PROVIDER_SITE_OTHER): Payer: BLUE CROSS/BLUE SHIELD | Admitting: Physician Assistant

## 2017-06-07 VITALS — BP 124/82 | HR 95 | Temp 98.1°F | Resp 18 | Ht 66.26 in | Wt 142.8 lb

## 2017-06-07 DIAGNOSIS — R062 Wheezing: Secondary | ICD-10-CM

## 2017-06-07 DIAGNOSIS — J45901 Unspecified asthma with (acute) exacerbation: Secondary | ICD-10-CM

## 2017-06-07 DIAGNOSIS — R059 Cough, unspecified: Secondary | ICD-10-CM

## 2017-06-07 DIAGNOSIS — R05 Cough: Secondary | ICD-10-CM | POA: Diagnosis not present

## 2017-06-07 LAB — POC INFLUENZA A&B (BINAX/QUICKVUE)
INFLUENZA A, POC: NEGATIVE
INFLUENZA B, POC: NEGATIVE

## 2017-06-07 LAB — POCT CBC
GRANULOCYTE PERCENT: 66.3 % (ref 37–80)
HEMATOCRIT: 39.7 % (ref 37.7–47.9)
Hemoglobin: 13.3 g/dL (ref 12.2–16.2)
Lymph, poc: 2.4 (ref 0.6–3.4)
MCH: 32.1 pg — AB (ref 27–31.2)
MCHC: 33.5 g/dL (ref 31.8–35.4)
MCV: 95.8 fL (ref 80–97)
MID (CBC): 0.6 (ref 0–0.9)
MPV: 8.2 fL (ref 0–99.8)
POC GRANULOCYTE: 5.9 (ref 2–6.9)
POC LYMPH PERCENT: 26.8 %L (ref 10–50)
POC MID %: 6.9 %M (ref 0–12)
Platelet Count, POC: 292 10*3/uL (ref 142–424)
RBC: 4.15 M/uL (ref 4.04–5.48)
RDW, POC: 12.5 %
WBC: 8.9 10*3/uL (ref 4.6–10.2)

## 2017-06-07 MED ORDER — PREDNISONE 20 MG PO TABS: 40.0000 mg | ORAL_TABLET | Freq: Every day | ORAL | 0 refills | Status: AC

## 2017-06-07 MED ORDER — IPRATROPIUM BROMIDE 0.02 % IN SOLN
0.5000 mg | Freq: Once | RESPIRATORY_TRACT | Status: AC
  Administered 2017-06-07: 0.5 mg via RESPIRATORY_TRACT

## 2017-06-07 MED ORDER — ALBUTEROL SULFATE (2.5 MG/3ML) 0.083% IN NEBU
2.5000 mg | INHALATION_SOLUTION | Freq: Once | RESPIRATORY_TRACT | Status: AC
  Administered 2017-06-07: 2.5 mg via RESPIRATORY_TRACT

## 2017-06-07 NOTE — Patient Instructions (Addendum)
Start using albuterol inhaler every 4-6 hours as needed for wheezing.We are going to treat your underlying inflammation with oral prednisone. Prednisone is a steroid and can cause side effects such as headache, irritability, nausea, vomiting, increased heart rate, increased blood pressure, increased blood sugar, appetite changes, and insomnia. Please take tablets in the morning with a full meal to help decrease the chances of these side effects. Return to clinic if symptoms worsen, do not improve, or as needed.    IF you received an x-ray today, you will receive an invoice from [facility]. Please contact [facility] at [phone] with questions or concerns regarding your invoice.   IF you received labwork today, you will receive an invoice from [facility]. Please contact [facility] at [phone] with questions or concerns regarding your invoice.   Our billing staff will not be able to assist you with questions regarding bills from these companies.  You will be contacted with the lab results as soon as they are available. The fastest way to get your results is to activate your My Chart account. Instructions are located on the last page of this paperwork. If you have not heard from [place] regarding the results in 2 weeks, please contact this office.     Asthma, Acute Bronchospasm Acute bronchospasm caused by asthma is also referred to as an asthma attack. Bronchospasm means your air passages become narrowed. The narrowing is caused by inflammation and tightening of the muscles in the air tubes (bronchi) in your lungs. This can make it hard to breathe or cause you to wheeze and cough. What are the causes? Possible triggers are:  Animal dander from the skin, hair, or feathers of animals.  Dust mites contained in house dust.  Cockroaches.  Pollen from trees or grass.  Mold.  Cigarette or tobacco smoke.  Air pollutants such as dust, household cleaners, hair sprays, aerosol  sprays, paint fumes, strong chemicals, or strong odors.  Cold air or weather changes. Cold air may trigger inflammation. Winds increase molds and pollens in the air.  Strong emotions such as crying or laughing hard.  Stress.  Certain medicines such as aspirin or beta-blockers.  Sulfites in foods and drinks, such as dried fruits and wine.  Infections or inflammatory conditions, such as a flu, cold, or inflammation of the nasal membranes (rhinitis).  Gastroesophageal reflux disease (GERD). GERD is a condition where stomach acid backs up into your esophagus.  Exercise or strenuous activity.  What are the signs or symptoms?  Wheezing.  Excessive coughing, particularly at night.  Chest tightness.  Shortness of breath. How is this diagnosed? Your health care provider will ask you about your medical history and perform a physical exam. A chest X-ray or blood testing may be performed to look for other causes of your symptoms or other conditions that may have triggered your asthma attack. How is this treated? Treatment is aimed at reducing inflammation and opening up the airways in your lungs. Most asthma attacks are treated with inhaled medicines. These include quick relief or rescue medicines (such as bronchodilators) and controller medicines (such as inhaled corticosteroids). These medicines are sometimes given through an inhaler or a nebulizer. Systemic steroid medicine taken by mouth or given through an IV tube also can be used to reduce the inflammation when an attack is moderate or severe. Antibiotic medicines are only used if a bacterial infection is present. Follow these instructions at home:  Rest.  Drink plenty of liquids. This helps the mucus to remain thin  and be easily coughed up. Only use caffeine in moderation and do not use alcohol until you have recovered from your illness.  Do not smoke. Avoid being exposed to secondhand smoke.  You play a critical role in keeping  yourself in good health. Avoid exposure to things that cause you to wheeze or to have breathing problems.  Keep your medicines up-to-date and available. Carefully follow your health care provider's treatment plan.  Take your medicine exactly as prescribed.  When pollen or pollution is bad, keep windows closed and use an air conditioner or go to places with air conditioning.  Asthma requires careful medical care. See your health care provider for a follow-up as advised. If you are more than [redacted] weeks pregnant and you were prescribed any new medicines, let your obstetrician know about the visit and how you are doing. Follow up with your health care provider as directed.  After you have recovered from your asthma attack, make an appointment with your outpatient doctor to talk about ways to reduce the likelihood of future attacks. If you do not have a doctor who manages your asthma, make an appointment with a primary care doctor to discuss your asthma. Get help right away if:  You are getting worse.  You have trouble breathing. If severe, call your local emergency services (911 in the [place]  You develop chest pain or discomfort.  You are vomiting.  You are not able to keep fluids down.  You are coughing up yellow, green, brown, or bloody sputum.  You have a fever and your symptoms suddenly get worse.  You have trouble swallowing. This information is not intended to replace advice given to you by your health care provider. Make sure you discuss any questions you have with your health care provider. Document Released: 07/01/2006 Document Revised: 08/28/2015 Document Reviewed: 09/21/2012 Elsevier Interactive Patient Education  2017 [employer].

## 2017-06-07 NOTE — Progress Notes (Signed)
MRN: [id number] DOB: June 08, 1965  Subjective:   Courtney Gomez is a 52 y.o. female presenting for chief complaint of Cough (X 1 week) and Fatigue (X 1 week) .  Reports one week history of illness. Started out with severe body aches, subjective fever, fatigue, and cough. Body aches resolved. Fatigue and productive cough have continued. Has some associated wheezing and runny nose.Symptoms are improving. Has tried dayquil, nyquil, cough drops, ginger ale with no full relief. Denies sinus pain, ear pain, shortness of breath and chest pain, nausea, vomiting, abdominal pain and diarrhea. Has not had sick contact with anyone. Has history of seasonal allergies and asthma. Controlled on zyrtec, singulair, flonase, flovent daily, and albuterol inhaler prn.  Has only had to use albuterol inhaler twice since last week. Patient has had flu shot this season. Denies smoking. Denies any other aggravating or relieving factors, no other questions or concerns.  Courtney Gomez has a current medication list which includes the following prescription(s): albuterol, cetirizine, fluticasone, fluticasone, montelukast, non-metallic deodorant, and prednisone. Also is allergic to lanolin and latex.  Courtney Gomez  has a past medical history of Anxiety, Asthma, Breast cancer ([place]), Breast cancer of upper-outer quadrant of right female breast ([place]) (07/15/2015), Eczema, History of motion sickness, Personal history of chemotherapy, Personal history of radiation therapy, and PONV (postoperative nausea and vomiting). Also  has a past surgical history that includes Dental surgery (1982); Portacath placement (Right, 07/31/2015); Radioactive seed guided mastectomy with axillary sentinel lymph node biopsy (Right, 01/08/2016); Port-a-cath removal (N/A, 03/16/2016); Breast lumpectomy (Right, 01/08/2016); Breast biopsy (Right, 08/07/2015); and Breast biopsy (Right, 07/11/2015).   Objective:   Vitals: BP 124/82 (BP Location: Left Arm, Patient  Position: Sitting, Cuff Size: Normal)   Pulse 95   Temp 98.1 F (36.7 C) (Oral)   Resp 18   Ht 5' 6.26" (1.683 m)   Wt 142 lb 12.8 oz (64.8 kg)   SpO2 97%   PF 370 L/min Comment: after- 1- 360  2- 360  3- 370  BMI 22.87 kg/m   Physical Exam  Constitutional: She is oriented to person, place, and time. She appears well-developed and well-nourished. No distress.  HENT:  Head: Normocephalic and atraumatic.  Right Ear: Tympanic membrane, external ear and ear canal normal.  Left Ear: Tympanic membrane, external ear and ear canal normal.  Nose: Mucosal edema and rhinorrhea present. Right sinus exhibits no maxillary sinus tenderness and no frontal sinus tenderness. Left sinus exhibits no maxillary sinus tenderness and no frontal sinus tenderness.  Mouth/Throat: Uvula is midline, oropharynx is clear and moist and mucous membranes are normal. No tonsillar exudate.  Eyes: Conjunctivae are normal.  Neck: Normal range of motion.  Cardiovascular: Normal rate, regular rhythm, normal heart sounds and intact distal pulses.  Pulmonary/Chest: Effort normal. She has no decreased breath sounds. She has wheezes (intermittent faint wheezes noted in posterior lung field, most notable on right posterior lung field ). She has no rhonchi. She has no rales.  Lymphadenopathy:       Head (right side): No submental, no submandibular, no tonsillar, no preauricular, no posterior auricular and no occipital adenopathy present.       Head (left side): No submental, no submandibular, no tonsillar, no preauricular, no posterior auricular and no occipital adenopathy present.    She has no cervical adenopathy.       Right: No supraclavicular adenopathy present.       Left: No supraclavicular adenopathy present.  Neurological: She is alert and oriented to person,  place, and time.  Skin: Skin is warm and dry.  Psychiatric: She has a normal mood and affect.  Vitals reviewed.   Results for orders placed or performed in  visit on 06/07/17 (from the past 24 hour(s))  POCT CBC     Status: Abnormal   Collection Time: 06/07/17  4:10 PM  Result Value Ref Range   WBC 8.9 4.6 - 10.2 K/uL   Lymph, poc 2.4 0.6 - 3.4   POC LYMPH PERCENT 26.8 10 - 50 %L   MID (cbc) 0.6 0 - 0.9   POC MID % 6.9 0 - 12 %M   POC Granulocyte 5.9 2 - 6.9   Granulocyte percent 66.3 37 - 80 %G   RBC 4.15 4.04 - 5.48 M/uL   Hemoglobin 13.3 12.2 - 16.2 g/dL   HCT, POC 39.7 37.7 - 47.9 %   MCV 95.8 80 - 97 fL   MCH, POC 32.1 (A) 27 - 31.2 pg   MCHC 33.5 31.8 - 35.4 g/dL   RDW, POC 12.5 %   Platelet Count, POC 292 142 - 424 K/uL   MPV 8.2 0 - 99.8 fL  POC Influenza A&B(BINAX/QUICKVUE)     Status: None   Collection Time: 06/07/17  4:41 PM  Result Value Ref Range   Influenza A, POC Negative Negative   Influenza B, POC Negative Negative   Wheezing improved with duoneb. Few faint wheezes auscultated with forced expiratory breathing in posterior lung fields bilaterally. Pt states she feels better.  Peak flow reading is 370, about 88 % of predicted.  Assessment and Plan :  1. Cough - POCT CBC - POC Influenza A&B(BINAX/QUICKVUE)  2. Wheezing Improved with duoneb.  - ipratropium (ATROVENT) nebulizer solution 0.5 mg - albuterol (PROVENTIL) (2.5 MG/3ML) 0.083% nebulizer solution 2.5 mg  3. Asthma with acute exacerbation, unspecified asthma severity, unspecified whether persistent Secondary to URI. Wheezing improved with DuoNeb in office.  Few faint wheezes noted with forced expiratory breathing.  Point-of-care flu test negative.  WBC WNL.Pt is overall well appearing, no distress.  Peak flow and vitals reassuring. Recommend short course of oral steroids and albuterol inhaler q 4-6 hrs prn. Advised to return to clinic if symptoms worsen, do not improve, or as needed. - predniSONE (DELTASONE) 20 MG tablet; Take 2 tablets (40 mg total) by mouth daily with breakfast for 5 days.  Dispense: 10 tablet; Refill: 0  Tenna Delaine, PA-C  Primary  Care at [facility] 06/07/2017 4:48 PM

## 2017-06-12 ENCOUNTER — Encounter: Payer: Self-pay | Admitting: Physician Assistant

## 2017-07-08 ENCOUNTER — Ambulatory Visit
Admission: RE | Admit: 2017-07-08 | Discharge: 2017-07-08 | Disposition: A | Discharge status: Home / Self Care | Payer: BLUE CROSS/BLUE SHIELD | Source: Ambulatory Visit | Attending: Hematology and Oncology | Admitting: Hematology and Oncology

## 2017-07-08 ENCOUNTER — Other Ambulatory Visit: Payer: Self-pay | Admitting: Hematology and Oncology

## 2017-07-08 DIAGNOSIS — R928 Other abnormal and inconclusive findings on diagnostic imaging of breast: Secondary | ICD-10-CM

## 2017-07-08 DIAGNOSIS — Z853 Personal history of malignant neoplasm of breast: Secondary | ICD-10-CM

## 2017-07-11 NOTE — Assessment & Plan Note (Signed)
Right mammogram and ultrasound 07/28/2015 Right breast mass in the axillary tail 1.7 x 1.5 x 1.3 cm axillary ultrasound negative, T1c N0 stage IA clinical stage Right breast biopsy 07/11/2015: 10:00 position: Invasive ductal carcinoma grade 3, ER 0%, PR 0%, Ki-67 95%, HER-2 negative ratio 1.21  Right lumpectomy 01/08/2016: Right lumpectomy: Focal ALH, fibrocystic change, no residual cancer, 0/7 lymph nodes negative, triple negative disease, complete pathologic response  Treatment Summary: 1. Neoadj chemotherapy with Adriamycin and Cytoxan dose dense 4 foll by Taxol x 1 then Abraxaneweekly total 12 weeks from 08/02/2015 to 12/20/2015 2. Followed by breast conserving surgery with sentinel lymph node study 01/08/2016 3. Followed by adjuvant radiation therapy 01/30/2016- 03/17/2016 ------------------------------------------------------------------------------------------------------------------------------ Treatment plan: Surveillance Breast exam 07/11/2017: No palpable lumps or nodules Mammogram and U/S 07/08/2017: No mammographic evidence of malignancy in either breast Return to clinic in 1 year for surveillance and follow-up

## 2017-07-12 ENCOUNTER — Telehealth: Payer: Self-pay | Admitting: Hematology and Oncology

## 2017-07-12 ENCOUNTER — Inpatient Hospital Stay: Payer: BLUE CROSS/BLUE SHIELD | Attending: Hematology and Oncology | Admitting: Hematology and Oncology

## 2017-07-12 DIAGNOSIS — Z170 Estrogen receptor positive status [ER+]: Secondary | ICD-10-CM

## 2017-07-12 DIAGNOSIS — C50411 Malignant neoplasm of upper-outer quadrant of right female breast: Secondary | ICD-10-CM

## 2017-07-12 DIAGNOSIS — Z9221 Personal history of antineoplastic chemotherapy: Secondary | ICD-10-CM | POA: Insufficient documentation

## 2017-07-12 DIAGNOSIS — Z923 Personal history of irradiation: Secondary | ICD-10-CM | POA: Diagnosis not present

## 2017-07-12 NOTE — Progress Notes (Signed)
Patient Care Team: Vania Rea, MD as PCP - General (Obstetrics and Gynecology) Nicholas Lose, MD as Consulting Physician (Hematology and Oncology) Rolm Bookbinder, MD as Consulting Physician (General Surgery) Delice Bison, Charlestine Massed, NP as Nurse Practitioner (Hematology and Oncology) Tyler Pita, MD as Consulting Physician (Radiation Oncology)  DIAGNOSIS:  Encounter Diagnosis  Name Primary?  . Malignant neoplasm of upper-outer quadrant of right breast in female, estrogen receptor positive ([place])     SUMMARY OF ONCOLOGIC HISTORY:   Breast cancer of upper-outer quadrant of right female breast ([place])   07/11/2015 Mammogram    Right breast mass in the axillary tail 1.7 x 1.5 x 1.3 cm axillary ultrasound negative, T1c N0 stage IA clinical stage      07/11/2015 Initial Diagnosis    Right breast biopsy 10:00 position: Invasive ductal carcinoma grade 3, ER 0%, P of 0%, Ki-67 95%, HER-2 negative ratio 1.21; right breast biopsy 12:00: Fibrocystic changes      08/02/2015 - 12/20/2015 Neo-Adjuvant Chemotherapy    Dose dense Adriamycin and Cytoxan 4 followed by Taxol, switched to Abraxane with cycle 2, completed 12 weekly cycles      08/15/2015 Genetic Testing    Genetic testing was normal, and did not reveal a deleterious mutation.  Additionally, no variants of uncertain significance (VUSes) were found.  Genes tested include: ATM, BARD1, BRCA1, BRCA2, BRIP1, CDH1, CHEK2, FANCC, MLH1, MSH2, MSH6, NBN, PALB2, PMS2, PTEN, RAD51C, RAD51D, TP53, and XRCC2.  This panel also includes deletion/duplication analysis (without sequencing) for one gene, EPCAM.       12/20/2015 Breast MRI    Radiologic complete response. 2 foci of enhancement 12:00 and 11:30 position previously biopsy-proven benign      01/08/2016 Surgery    Right lumpectomy: Focal ALH, fibrocystic change, no residual cancer, 0/7 lymph nodes negative, triple negative disease, complete pathologic response      01/30/2016 -  03/17/2016 Radiation Therapy    Adjuvant radiation therapy ([place]): 1. The right breast was treated to 50.4 Gy in 28 fractions at 1.8 Gy per fraction. 2. The right breast was boosted to 10 Gy in 5 fractions at 2 Gy per fraction.        CHIEF COMPLIANT: Surveillance of breast cancer  INTERVAL HISTORY: Courtney Gomez is a 52 year old with above-mentioned history of right breast cancer treated with neoadjuvant chemotherapy followed by lumpectomy and radiation.  She is currently on surveillance.  She reports no new problems or concerns.  Denies any lumps or nodules in the breast.  Denies neuropathy.  She had a complete pathologic response to initial chemotherapy.  REVIEW OF SYSTEMS:   Constitutional: Denies fevers, chills or abnormal weight loss Eyes: Denies blurriness of vision Ears, nose, mouth, throat, and face: Denies mucositis or sore throat Respiratory: Denies cough, dyspnea or wheezes Cardiovascular: Denies palpitation, chest discomfort Gastrointestinal:  Denies nausea, heartburn or change in bowel habits Skin: Denies abnormal skin rashes Lymphatics: Denies new lymphadenopathy or easy bruising Neurological:Denies numbness, tingling or new weaknesses Behavioral/Psych: Mood is stable, no new changes  Extremities: No lower extremity edema Breast:  denies any pain or lumps or nodules in either breasts All other systems were reviewed with the patient and are negative.  I have reviewed the past medical history, past surgical history, social history and family history with the patient and they are unchanged from previous note.  ALLERGIES:  is allergic to lanolin and latex.  MEDICATIONS:  Current Outpatient Medications  Medication Sig Dispense Refill  . albuterol (PROAIR HFA) 108 (90  BASE) MCG/ACT inhaler Inhale 2 puffs into the lungs every 6 (six) hours as needed for wheezing or shortness of breath. Reported on 08/23/2015    . cetirizine (ZYRTEC) 10 MG tablet Take 10 mg by mouth daily  as needed. Reported on 08/23/2015    . fluticasone (FLONASE) 50 MCG/ACT nasal spray PLACE 1 SPRAY INTO BOTH NOSTRILS DAILY. 16 g 2  . fluticasone (FLOVENT HFA) 110 MCG/ACT inhaler Inhale 2 puffs into the lungs 2 (two) times daily. 1 Inhaler 5  . montelukast (SINGULAIR) 10 MG tablet TAKE 1 TABLET BY MOUTH EVERYDAY AT BEDTIME 30 tablet 5  . non-metallic deodorant (ALRA) MISC Apply 1 application topically daily as needed.     No current facility-administered medications for this visit.     PHYSICAL EXAMINATION: ECOG PERFORMANCE STATUS: 0 - Asymptomatic  There were no vitals filed for this visit. There were no vitals filed for this visit.  GENERAL:alert, no distress and comfortable SKIN: skin color, texture, turgor are normal, no rashes or significant lesions EYES: normal, Conjunctiva are pink and non-injected, sclera clear OROPHARYNX:no exudate, no erythema and lips, buccal mucosa, and tongue normal  NECK: supple, thyroid normal size, non-tender, without nodularity LYMPH:  no palpable lymphadenopathy in the cervical, axillary or inguinal LUNGS: clear to auscultation and percussion with normal breathing effort HEART: regular rate & rhythm and no murmurs and no lower extremity edema ABDOMEN:abdomen soft, non-tender and normal bowel sounds MUSCULOSKELETAL:no cyanosis of digits and no clubbing  NEURO: alert & oriented x 3 with fluent speech, no focal motor/sensory deficits EXTREMITIES: No lower extremity edema BREAST: No palpable masses or nodules in either right or left breasts. No palpable axillary supraclavicular or infraclavicular adenopathy no breast tenderness or nipple discharge. (exam performed in the presence of a chaperone)  LABORATORY DATA:  I have reviewed the data as listed CMP Latest Ref Rng & Units 03/16/2016 12/20/2015 12/13/2015  Glucose 65 - 99 mg/dL 101(H) 104 68(L)  BUN 6 - 20 mg/dL 6 10.7 10.8  Creatinine 0.44 - 1.00 mg/dL 0.67 0.8 0.8  Sodium 135 - 145 mmol/L 142 144  144  Potassium 3.5 - 5.1 mmol/L 3.8 4.2 3.9  Chloride 101 - 111 mmol/L 107 - -  CO2 22 - 32 mmol/L _0 Calcium 8.9 - 10.3 mg/dL 9.5 9.6 9.5  Total Protein 6.4 - 8.3 g/dL - 6.6 6.7  Total Bilirubin 0.20 - 1.20 mg/dL - 0.42 0.48  Alkaline Phos 40 - 150 U/L - 80 86  AST 5 - 34 U/L - 19 21  ALT 0 - 55 U/L - 29 31    Lab Results  Component Value Date   WBC 8.9 06/07/2017   HGB 13.3 06/07/2017   HCT 39.7 06/07/2017   MCV 95.8 06/07/2017   PLT 160 03/16/2016   NEUTROABS 2.1 12/20/2015    ASSESSMENT & PLAN:  Breast cancer of upper-outer quadrant of right female breast ([place]) Right mammogram and ultrasound 07/28/2015 Right breast mass in the axillary tail 1.7 x 1.5 x 1.3 cm axillary ultrasound negative, T1c N0 stage IA clinical stage Right breast biopsy 07/11/2015: 10:00 position: Invasive ductal carcinoma grade 3, ER 0%, PR 0%, Ki-67 95%, HER-2 negative ratio 1.21  Right lumpectomy 01/08/2016: Right lumpectomy: Focal ALH, fibrocystic change, no residual cancer, 0/7 lymph nodes negative, triple negative disease, complete pathologic response  Treatment Summary: 1. Neoadj chemotherapy with Adriamycin and Cytoxan dose dense 4 foll by Taxol x 1 then Abraxaneweekly total 12 weeks from 08/02/2015 to 12/20/2015 2.  Followed by breast conserving surgery with sentinel lymph node study 01/08/2016 3. Followed by adjuvant radiation therapy 01/30/2016- 03/17/2016 ------------------------------------------------------------------------------------------------------------------------------ Treatment plan: Surveillance Breast exam 07/11/2017: No palpable lumps or nodules Mammogram and U/S 07/08/2017: No mammographic evidence of malignancy in either breast Return to clinic in 1 year for surveillance and follow-up   No orders of the defined types were placed in this encounter.  The patient has a good understanding of the overall plan. she agrees with it. she will call with any problems that  may develop before the next visit here.   Harriette Ohara, MD 07/12/17

## 2017-07-12 NOTE — Telephone Encounter (Signed)
Gave avs and calendar ° °

## 2017-07-15 ENCOUNTER — Ambulatory Visit: Payer: BLUE CROSS/BLUE SHIELD | Admitting: Hematology and Oncology

## 2017-07-29 ENCOUNTER — Ambulatory Visit (INDEPENDENT_AMBULATORY_CARE_PROVIDER_SITE_OTHER): Payer: BLUE CROSS/BLUE SHIELD | Admitting: Physician Assistant

## 2017-07-29 DIAGNOSIS — Z23 Encounter for immunization: Secondary | ICD-10-CM | POA: Diagnosis not present

## 2017-07-29 NOTE — Patient Instructions (Signed)
You were given the MMR vaccination today.

## 2017-07-29 NOTE — Progress Notes (Signed)
Pt is here today to receive MMR immunization.

## 2017-08-23 ENCOUNTER — Encounter: Payer: Self-pay | Admitting: Family Medicine

## 2017-08-27 ENCOUNTER — Other Ambulatory Visit: Payer: Self-pay

## 2017-08-27 ENCOUNTER — Encounter: Payer: Self-pay | Admitting: Family Medicine

## 2017-08-27 ENCOUNTER — Ambulatory Visit (INDEPENDENT_AMBULATORY_CARE_PROVIDER_SITE_OTHER): Payer: BLUE CROSS/BLUE SHIELD | Admitting: Family Medicine

## 2017-08-27 VITALS — BP 126/80 | HR 96 | Temp 98.0°F | Resp 16 | Ht 66.14 in | Wt 141.0 lb

## 2017-08-27 DIAGNOSIS — L659 Nonscarring hair loss, unspecified: Secondary | ICD-10-CM

## 2017-08-27 DIAGNOSIS — L2082 Flexural eczema: Secondary | ICD-10-CM

## 2017-08-27 DIAGNOSIS — C50411 Malignant neoplasm of upper-outer quadrant of right female breast: Secondary | ICD-10-CM

## 2017-08-27 DIAGNOSIS — J3089 Other allergic rhinitis: Secondary | ICD-10-CM | POA: Diagnosis not present

## 2017-08-27 DIAGNOSIS — J4540 Moderate persistent asthma, uncomplicated: Secondary | ICD-10-CM

## 2017-08-27 NOTE — Progress Notes (Signed)
Subjective:    Patient ID: Courtney Gomez, female    DOB: Nov 11, 1965, 52 y.o.   MRN: [id number]  08/27/2017  Alopecia (pt states she noticed about 3 weeks ago some hair loss in the Middle of her head and now she has a bald spot and just wanted to make sure everything is ok. )    HPI This 52 y.o. female presents for alopecia. Two years out cancer survival.  Thick hair.   Thinning of hair scalp.  Bone scan last June 2018. Full body. Vertigo and hearing loss on R.  MRI brain in 01/2017. Family support with two houses.   Two sons (40, 33); [place] in fall. [place]. No change in medications for 2015.  Eczema.  Much better.  No hair coloring.   [place].  Good support group.   Previous Social worker.   Dermatology [facility]. Trying to exercise. Steroids in November and in March 2019.   BP Readings from Last 3 Encounters:  08/27/17 126/80  07/12/17 135/85  06/07/17 124/82   Wt Readings from Last 3 Encounters:  08/27/17 141 lb (64 kg)  07/12/17 141 lb (64 kg)  06/07/17 142 lb 12.8 oz (64.8 kg)   Immunization History  Administered Date(s) Administered  . DTaP 10/19/2017  . Influenza,inj,Quad PF,6+ Mos 01/13/2017  . Influenza,inj,quad, With Preservative 01/08/2014  . Influenza-Unspecified 12/11/2015  . MMR 07/29/2017  . Tdap 08/23/2016    Review of Systems  Constitutional: Negative for activity change, appetite change, chills, diaphoresis, fatigue, fever and unexpected weight change.  HENT: Negative for congestion, dental problem, drooling, ear discharge, ear pain, facial swelling, hearing loss, mouth sores, nosebleeds, postnasal drip, rhinorrhea, sinus pressure, sneezing, sore throat, tinnitus, trouble swallowing and voice change.   Eyes: Negative for photophobia, pain, discharge, redness, itching and visual disturbance.  Respiratory: Negative for apnea, cough, choking, chest tightness, shortness of breath, wheezing and stridor.   Cardiovascular: Negative for chest  pain, palpitations and leg swelling.  Gastrointestinal: Negative for abdominal distention, abdominal pain, anal bleeding, blood in stool, constipation, diarrhea, nausea, rectal pain and vomiting.  Endocrine: Negative for cold intolerance, heat intolerance, polydipsia, polyphagia and polyuria.  Genitourinary: Negative for decreased urine volume, difficulty urinating, dyspareunia, dysuria, enuresis, flank pain, frequency, genital sores, hematuria, menstrual problem, pelvic pain, urgency, vaginal bleeding, vaginal discharge and vaginal pain.  Musculoskeletal: Negative for arthralgias, back pain, gait problem, joint swelling, myalgias, neck pain and neck stiffness.  Skin: Positive for rash. Negative for color change, pallor and wound.  Allergic/Immunologic: Negative for environmental allergies, food allergies and immunocompromised state.  Neurological: Positive for dizziness. Negative for tremors, seizures, syncope, facial asymmetry, speech difficulty, weakness, light-headedness, numbness and headaches.  Hematological: Negative for adenopathy. Does not bruise/bleed easily.  Psychiatric/Behavioral: Positive for dysphoric mood. Negative for agitation, behavioral problems, confusion, decreased concentration, hallucinations, self-injury, sleep disturbance and suicidal ideas. The patient is nervous/anxious. The patient is not hyperactive.     Past Medical History:  Diagnosis Date  . Anxiety    not currently  . Asthma   . Breast cancer ([place])   . Breast cancer of upper-outer quadrant of right female breast ([place]) 07/15/2015  . Eczema   . History of motion sickness   . Personal history of chemotherapy   . Personal history of radiation therapy   . PONV (postoperative nausea and vomiting)    Past Surgical History:  Procedure Laterality Date  . BREAST BIOPSY Right 08/07/2015  . BREAST BIOPSY Right 07/11/2015  . BREAST LUMPECTOMY Right 01/08/2016  .  DENTAL SURGERY  1982   impacted teeth  . PORT-A-CATH  REMOVAL N/A 03/16/2016   Procedure: REMOVAL PORT-A-CATH;  Surgeon: Rolm Bookbinder, MD;  Location: [facility];  Service: General;  Laterality: N/A;  . PORTACATH PLACEMENT Right 07/31/2015   Procedure: INSERTION PORT-A-CATH WITH ULTRASOUND ;  Surgeon: Rolm Bookbinder, MD;  Location: [facility];  Service: General;  Laterality: Right;  . RADIOACTIVE SEED GUIDED PARTIAL MASTECTOMY WITH AXILLARY SENTINEL LYMPH NODE BIOPSY Right 01/08/2016   Procedure: RADIOACTIVE SEED LUMPECTOMY  WITH AXILLARY SENTINEL LYMPH NODE BIOPSY AND BLUE DYE INJECTION;  Surgeon: Rolm Bookbinder, MD;  Location: [facility];  Service: General;  Laterality: Right;   Allergies  Allergen Reactions  . Lanolin Itching  . Latex Itching and Swelling   Current Outpatient Medications on File Prior to Visit  Medication Sig Dispense Refill  . albuterol (PROAIR HFA) 108 (90 BASE) MCG/ACT inhaler Inhale 2 puffs into the lungs every 6 (six) hours as needed for wheezing or shortness of breath. Reported on 08/23/2015    . cetirizine (ZYRTEC) 10 MG tablet Take 10 mg by mouth daily as needed. Reported on 08/23/2015    . fluticasone (FLONASE) 50 MCG/ACT nasal spray PLACE 1 SPRAY INTO BOTH NOSTRILS DAILY. 16 g 2  . fluticasone (FLOVENT HFA) 110 MCG/ACT inhaler Inhale 2 puffs into the lungs 2 (two) times daily. 1 Inhaler 5  . montelukast (SINGULAIR) 10 MG tablet TAKE 1 TABLET BY MOUTH EVERYDAY AT BEDTIME 30 tablet 5  . non-metallic deodorant (ALRA) MISC Apply 1 application topically daily as needed.     No current facility-administered medications on file prior to visit.    Social History   Socioeconomic History  . Marital status: Legally Separated    Spouse name: Not on file  . Number of children: 2  . Years of education: Not on file  . Highest education level: Not on file  Occupational History  . Not on file  Social Needs  . Financial resource strain: Not on file  . Food insecurity:    Worry: Not on file    Inability: Not on  file  . Transportation needs:    Medical: Not on file    Non-medical: Not on file  Tobacco Use  . Smoking status: Never Smoker  . Smokeless tobacco: Never Used  Substance and Sexual Activity  . Alcohol use: Yes    Alcohol/week: 0.0 oz    Comment: ocassional  . Drug use: No  . Sexual activity: Not Currently    Comment: Was on BCP stopped 07/13/15  Lifestyle  . Physical activity:    Days per week: Not on file    Minutes per session: Not on file  . Stress: Not on file  Relationships  . Social connections:    Talks on phone: Not on file    Gets together: Not on file    Attends religious service: Not on file    Active member of club or organization: Not on file    Attends meetings of clubs or organizations: Not on file    Relationship status: Not on file  . Intimate partner violence:    Fear of current or ex partner: Not on file    Emotionally abused: Not on file    Physically abused: Not on file    Forced sexual activity: Not on file  Other Topics Concern  . Not on file  Social History Narrative  . Not on file   Family History  Problem Relation Age of  Onset  . Colon polyps Mother        hx of one large colon polyp s/p resection  . Other Mother        hx of benign teratoma and molar pregnancy  . Prostate cancer Father 76  . Colon polyps Father        possible colon polyp hx; +diverticulitis  . Diverticulitis Father   . Other Maternal Uncle        hx of unspecified "lump on his back"  . Lupus Paternal Aunt   . Heart attack Paternal Uncle        d. 49s; smoker  . Heart Problems Paternal Uncle   . Colon cancer Maternal Grandmother 63       possible colon cancer  . Lung cancer Maternal Grandfather 14       smoker; +EtOH  . COPD Paternal Grandmother   . Heart Problems Paternal Grandmother   . Heart Problems Paternal Grandfather   . Prostate cancer Paternal Grandfather        possible prostate cancer dx. early 42s  . Stomach cancer Other        maternal great  grandmother (MGF's mother); dx. late 62s  . Colon cancer Other 12       paternal great grandmother (PGM's mother)  . Stomach cancer Other        paternal great uncles (PGF's brother) d. late 80s-early 33s       Objective:    BP 126/80   Pulse 96   Temp 98 F (36.7 C) (Oral)   Resp 16   Ht 5' 6.14" (1.68 m)   Wt 141 lb (64 kg)   SpO2 98%   BMI 22.66 kg/m  Physical Exam  Constitutional: She is oriented to person, place, and time. She appears well-developed and well-nourished. No distress.  HENT:  Head: Normocephalic and atraumatic.  Right Ear: External ear normal.  Left Ear: External ear normal.  Nose: Nose normal.  Mouth/Throat: Oropharynx is clear and moist.  Eyes: Pupils are equal, round, and reactive to light. Conjunctivae and EOM are normal.  Neck: Normal range of motion and full passive range of motion without pain. Neck supple. No JVD present. Carotid bruit is not present. No thyromegaly present.  Cardiovascular: Normal rate, regular rhythm and normal heart sounds. Exam reveals no gallop and no friction rub.  No murmur heard. Pulmonary/Chest: Effort normal and breath sounds normal. She has no wheezes. She has no rales.  Abdominal: Soft. Bowel sounds are normal. She exhibits no distension and no mass. There is no tenderness. There is no rebound and no guarding.  Musculoskeletal:       Right shoulder: Normal.       Left shoulder: Normal.       Cervical back: Normal.  Lymphadenopathy:    She has no cervical adenopathy.  Neurological: She is alert and oriented to person, place, and time. She has normal reflexes. No cranial nerve deficit. She exhibits normal muscle tone. Coordination normal.  Skin: Skin is warm and dry. No rash noted. She is not diaphoretic. No erythema. No pallor.  Localized area along scalp with hair loss; large area 4cm x 4 cm.  No scalp inflammation/dermatitis.  Psychiatric: She has a normal mood and affect. Her behavior is normal. Judgment and thought  content normal.  Nursing note and vitals reviewed.  No results found. Depression screen [facility] 2/9 08/27/2017 06/07/2017 01/25/2017 05/16/2016 01/27/2016  Decreased Interest 0 0 0 0 0  Down, Depressed,  Hopeless 0 0 0 0 0  PHQ - 2 Score 0 0 0 0 0   Fall Risk  08/27/2017 06/07/2017 01/25/2017 05/16/2016 01/27/2016  Falls in the past year? No No Yes Yes No  Number falls in past yr: - - 1 1 -  Comment - - 02/18 and 05/18 ankles - -  Injury with Fall? - - - Yes -        Assessment & Plan:   1. Alopecia   2. Malignant neoplasm of upper-outer quadrant of right female breast, unspecified estrogen receptor status ([place])   3. Flexural eczema   4. Allergic rhinitis   5. Moderate persistent asthma without complication     New onset alopecia: obtain labs to rule out secondary causes; refer to dermatology due to large area of involvement.  History of eczema with asthma and allergic rhinitis, yet no apparent hypersensitivity reaction or eczema rash to suggest etiology.  Orders Placed This Encounter  Procedures  . CBC with Differential/Platelet  . Comprehensive metabolic panel  . T4, free  . TSH  . Vitamin B12  . VITAMIN D 25 Hydroxy (Vit-D Deficiency, Fractures)  . Iron  . Ambulatory referral to Dermatology    Referral Priority:   Routine    Referral Type:   Consultation    Referral Reason:   Specialty Services Required    Requested Specialty:   Dermatology    Number of Visits Requested:   1   No orders of the defined types were placed in this encounter.   No follow-ups on file.   Judea Fennimore Elayne Guerin, M.D. Primary Care at [facility] [address] [place], [place]  [phone] phone [phone] fax

## 2017-08-27 NOTE — Patient Instructions (Addendum)
IRMA Pamella Pert, MD Delia Chimes, MD  IF you received an x-ray today, you will receive an invoice from [facility]. Please contact [facility] at [phone] with questions or concerns regarding your invoice.   IF you received labwork today, you will receive an invoice from [facility]. Please contact [facility] at [phone] with questions or concerns regarding your invoice.   Our billing staff will not be able to assist you with questions regarding bills from these companies.  You will be contacted with the lab results as soon as they are available. The fastest way to get your results is to activate your My Chart account. Instructions are located on the last page of this paperwork. If you have not heard from [place] regarding the results in 2 weeks, please contact this office.      Alopecia Areata, Adult Alopecia areata is a condition that causes you to lose hair. You may lose hair on your scalp in patches. In some cases, you may lose all the hair on your scalp (alopecia totalis) or all the hair from your face and body (alopecia universalis). Alopecia areata is an autoimmune disease. This means that your body's defense system (immune system) mistakes normal parts of the body for germs or other things that can make you sick. When you have alopecia areata, the immune system attacks the hair follicles. Alopecia areata usually develops in childhood, but it can develop at any age. For some people, their hair grows back on its own and hair loss does not happen again. For others, their hair may fall out and grow back in cycles. The hair loss may last many years. Having this condition can be emotionally difficult, but it is not dangerous. What are the causes? The cause of this condition is not known. What increases the risk? This condition is more likely to develop in people who have:  A family history of alopecia.  A family history of another autoimmune disease, including type 1  diabetes and rheumatoid arthritis.  Asthma and allergies.  Down syndrome.  What are the signs or symptoms? Round spots of patchy hair loss on the scalp is the main symptom of this condition. The spots may be mildly itchy. Other symptoms include:  Short dark hairs in the bald patches that are wider at the top (exclamation point hairs).  Dents, white spots, or lines in the fingernails or toenails.  Balding and body hair loss. This is rare.  How is this diagnosed? This condition is diagnosed based on your symptoms and family history. Your health care provider will also check your scalp skin, teeth, and nails. Your health care provider may refer you to a specialist in hair and skin disorders (dermatologist). You may also have tests, including:  A hair pull test.  Blood tests or other screening tests to check for autoimmune diseases, such as thyroid disease or diabetes.  Skin biopsy to confirm the diagnosis.  A procedure to examine the skin with a lighted magnifying instrument (dermoscopy).  How is this treated? There is no cure for alopecia areata. Treatment is aimed at promoting the regrowth of hair and preventing the immune system from overreacting. No single treatment is right for all people with alopecia areata. It depends on the type of hair loss you have and how severe it is. Work with your health care provider to find the best treatment for you. Treatment may include:  Having regular checkups to make sure the condition is not getting worse (watchful waiting).  Steroid creams  or pills for 6-8 weeks to stop the immune reaction and help hair to regrow more quickly.  Other topical medicines to alter the immune system response and support the hair growth cycle.  Steroid injections.  Therapy and counseling with a support group or therapist if you are having trouble coping with hair loss.  Follow these instructions at home:  Learn as much as you can about your condition.  Apply  topical creams only as told by your health care provider.  Take over-the-counter and prescription medicines only as told by your health care provider.  Consider getting a wig or products to make hair look fuller or to cover bald spots, if you feel uncomfortable with your appearance.  Get therapy or counseling if you are having a hard time coping with hair loss. Ask your health care provider to recommend a counselor or support group.  Keep all follow-up visits as told by your health care provider. This is important. Contact a health care provider if:  Your hair loss gets worse, even with treatment.  You have new symptoms.  You are struggling emotionally. Summary  Alopecia areata is an autoimmune condition that makes your body's defense system (immune system) attack the hair follicles. This causes you to lose hair.  Treatments may include regular checkups to make sure that the condition is not getting worse (watchful waiting), medicines, and steroid injections. This information is not intended to replace advice given to you by your health care provider. Make sure you discuss any questions you have with your health care provider. Document Released: 10/19/2003 Document Revised: 04/03/2016 Document Reviewed: 04/03/2016 Elsevier Interactive Patient Education  2018 [employer].

## 2017-08-28 LAB — COMPREHENSIVE METABOLIC PANEL
A/G RATIO: 1.8 (ref 1.2–2.2)
ALT: 16 IU/L (ref 0–32)
AST: 16 IU/L (ref 0–40)
Albumin: 4.6 g/dL (ref 3.5–5.5)
Alkaline Phosphatase: 93 IU/L (ref 39–117)
BILIRUBIN TOTAL: 0.3 mg/dL (ref 0.0–1.2)
BUN / CREAT RATIO: 12 (ref 9–23)
BUN: 9 mg/dL (ref 6–24)
CHLORIDE: 104 mmol/L (ref 96–106)
CO2: 27 mmol/L (ref 20–29)
Calcium: 9.8 mg/dL (ref 8.7–10.2)
Creatinine, Ser: 0.76 mg/dL (ref 0.57–1.00)
GFR calc non Af Amer: 90 mL/min/{1.73_m2} (ref >59)
GFR, EST AFRICAN AMERICAN: 104 mL/min/{1.73_m2} (ref >59)
Globulin, Total: 2.6 g/dL (ref 1.5–4.5)
Glucose: 98 mg/dL (ref 65–99)
POTASSIUM: 4.5 mmol/L (ref 3.5–5.2)
Sodium: 143 mmol/L (ref 134–144)
TOTAL PROTEIN: 7.2 g/dL (ref 6.0–8.5)

## 2017-08-28 LAB — CBC WITH DIFFERENTIAL/PLATELET
BASOS: 0 %
Basophils Absolute: 0 10*3/uL (ref 0.0–0.2)
EOS (ABSOLUTE): 0.2 10*3/uL (ref 0.0–0.4)
EOS: 4 %
HEMATOCRIT: 38.9 % (ref 34.0–46.6)
Hemoglobin: 13.4 g/dL (ref 11.1–15.9)
Immature Grans (Abs): 0 10*3/uL (ref 0.0–0.1)
Immature Granulocytes: 0 %
LYMPHS ABS: 2.1 10*3/uL (ref 0.7–3.1)
Lymphs: 41 %
MCH: 32.2 pg (ref 26.6–33.0)
MCHC: 34.4 g/dL (ref 31.5–35.7)
MCV: 94 fL (ref 79–97)
MONOS ABS: 0.4 10*3/uL (ref 0.1–0.9)
Monocytes: 8 %
Neutrophils Absolute: 2.5 10*3/uL (ref 1.4–7.0)
Neutrophils: 47 %
Platelets: 226 10*3/uL (ref 150–450)
RBC: 4.16 x10E6/uL (ref 3.77–5.28)
RDW: 13.3 % (ref 12.3–15.4)
WBC: 5.3 10*3/uL (ref 3.4–10.8)

## 2017-08-28 LAB — VITAMIN D 25 HYDROXY (VIT D DEFICIENCY, FRACTURES): Vit D, 25-Hydroxy: 27.7 ng/mL — ABNORMAL LOW (ref 30.0–100.0)

## 2017-08-28 LAB — TSH: TSH: 1.58 u[IU]/mL (ref 0.450–4.500)

## 2017-08-28 LAB — T4, FREE: Free T4: 1.25 ng/dL (ref 0.82–1.77)

## 2017-08-28 LAB — IRON: Iron: 77 ug/dL (ref 27–159)

## 2017-08-28 LAB — VITAMIN B12: VITAMIN B 12: 392 pg/mL (ref 232–1245)

## 2017-09-06 IMAGING — US US  BREAST BX W/ LOC DEV 1ST LESION IMG BX SPEC US GUIDE*R*
1 series · 9 of 9 positions shown · non-contrast
Comparison: Previous exam(s).

ADDENDUM:
Pathology revealed GRADE III INVASIVE DUCTAL CARCINOMA of the Right
breast at the 10:00 o'clock location. This was found to be
concordant by Dr. Shawn Eberhardt. Pathology results were discussed
with the patient by telephone. The patient reported doing well after
the biopsy. Post biopsy instructions and care were reviewed and
questions were answered. The patient was encouraged to call The
patient was referred to [REDACTED] [REDACTED] at [REDACTED] on July 24, 2015.

Pathology results reported by Anemone Tidemand, RN on 07/12/2015.
CLINICAL DATA: 49-year-old female presenting for ultrasound-guided
biopsy of a palpable right breast mass.
EXAM:
ULTRASOUND GUIDED RIGHT BREAST CORE NEEDLE BIOPSY

[Series 1: advbreast · 9 of 9 slices shown]
[im 1/9]
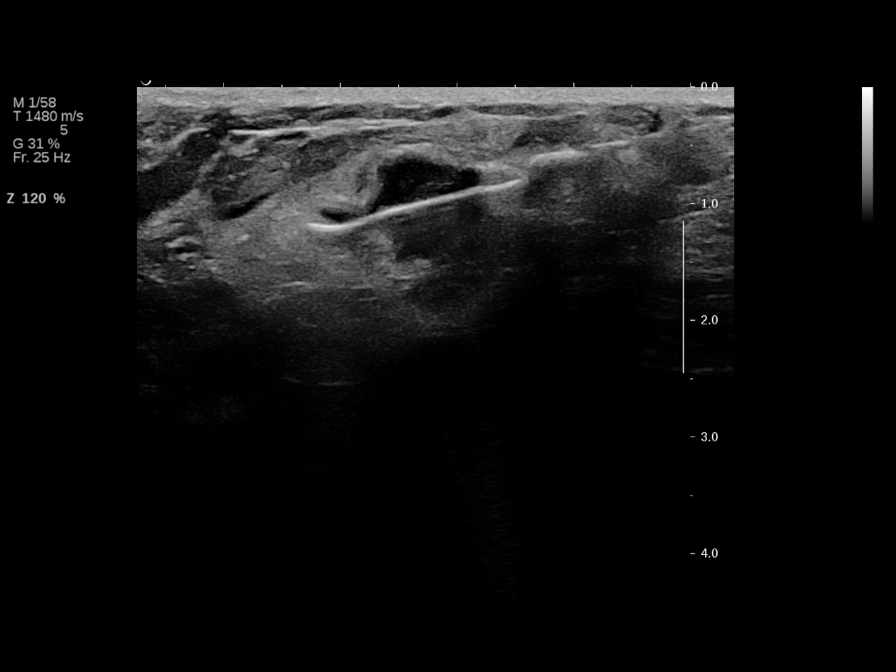
[im 2/9]
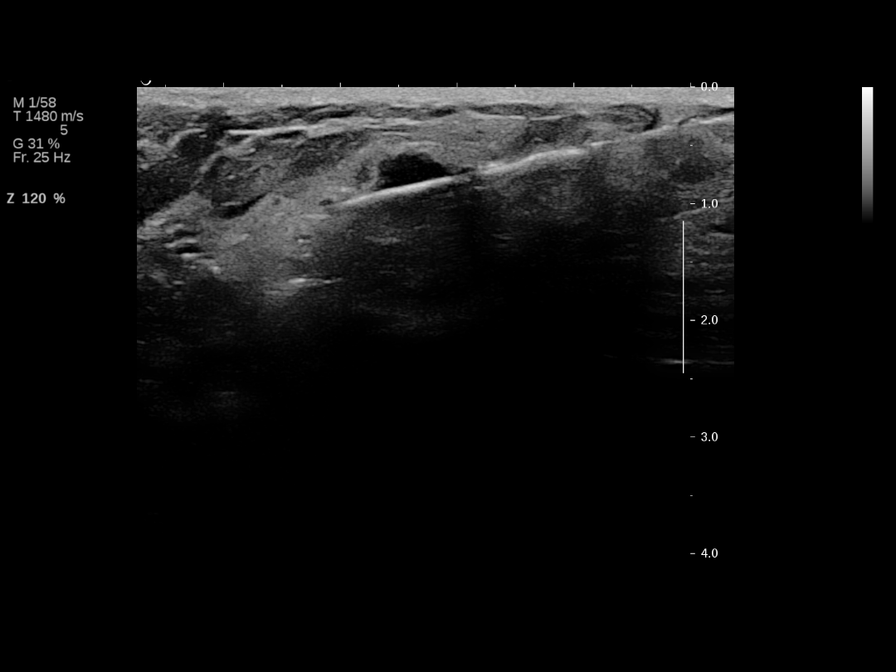
[im 3/9]
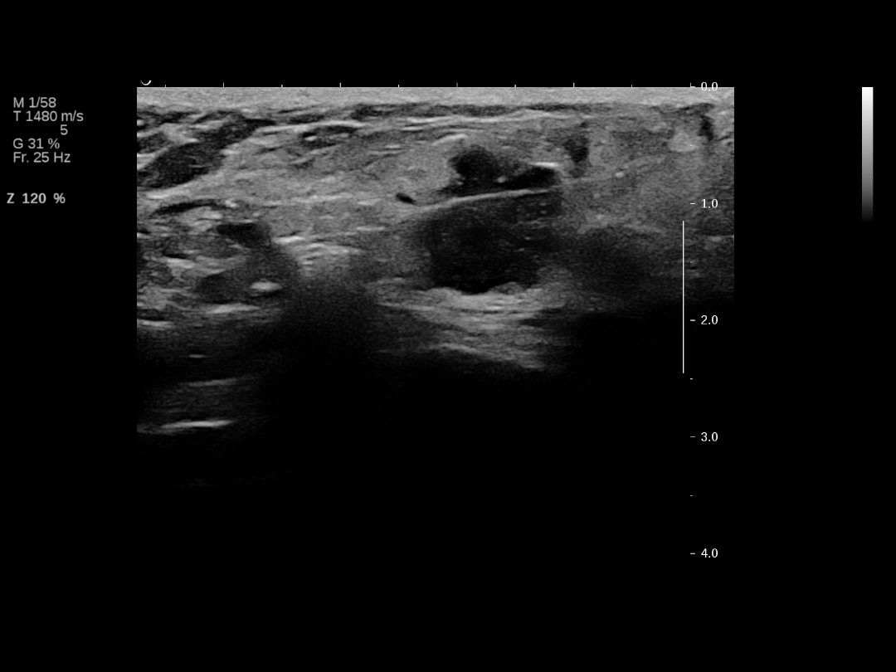
[im 4/9]
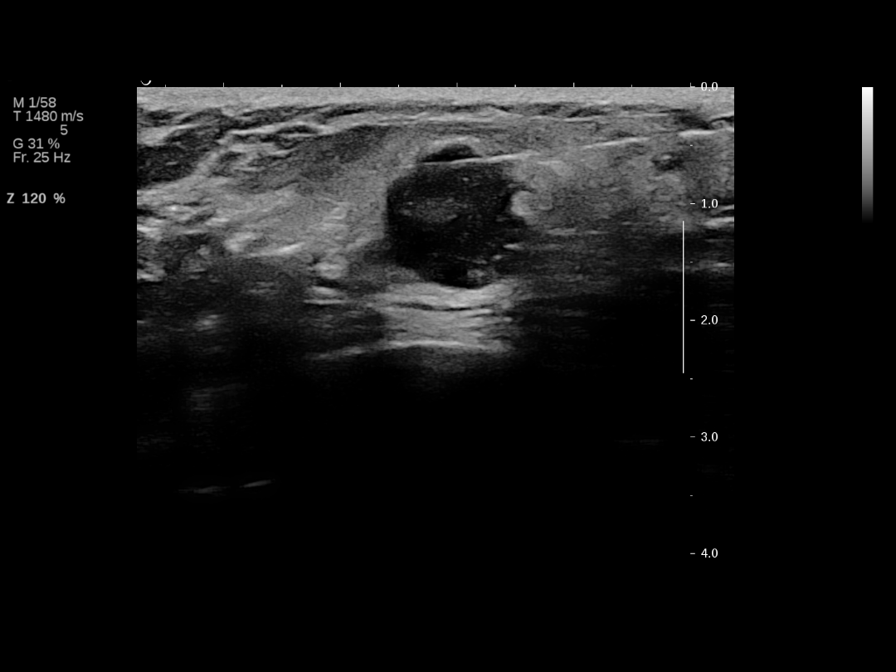
[im 5/9]
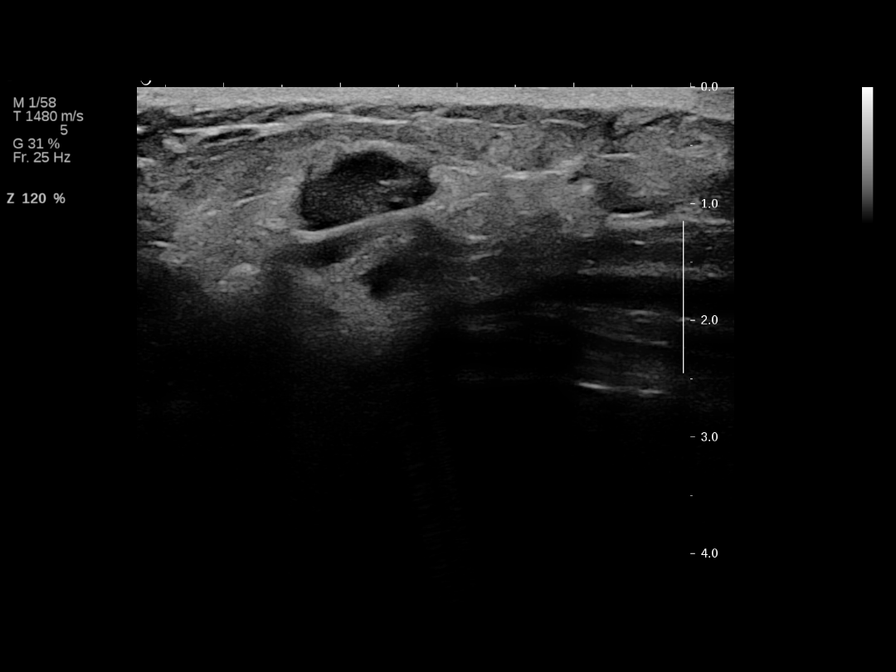
[im 6/9]
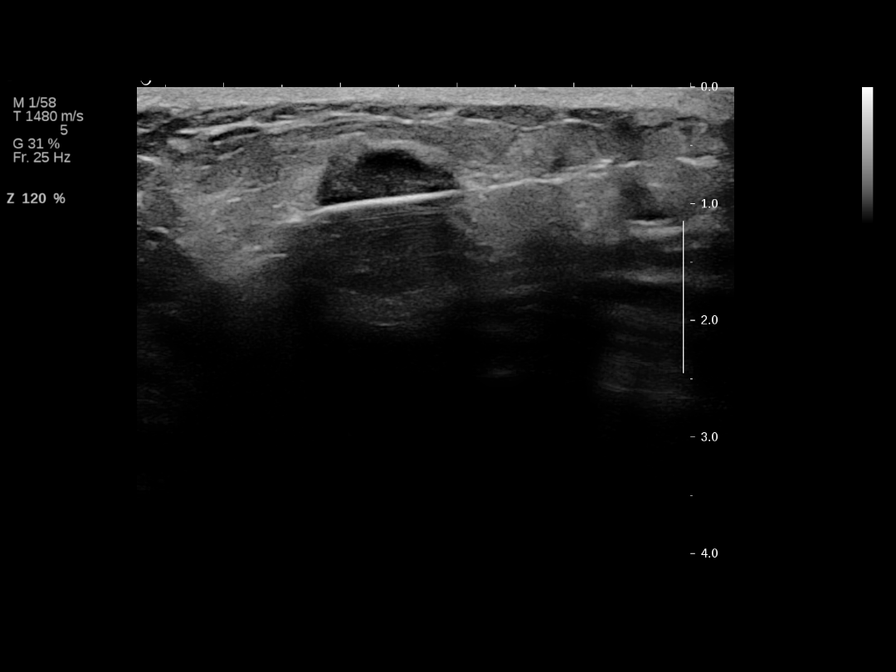
[im 7/9]
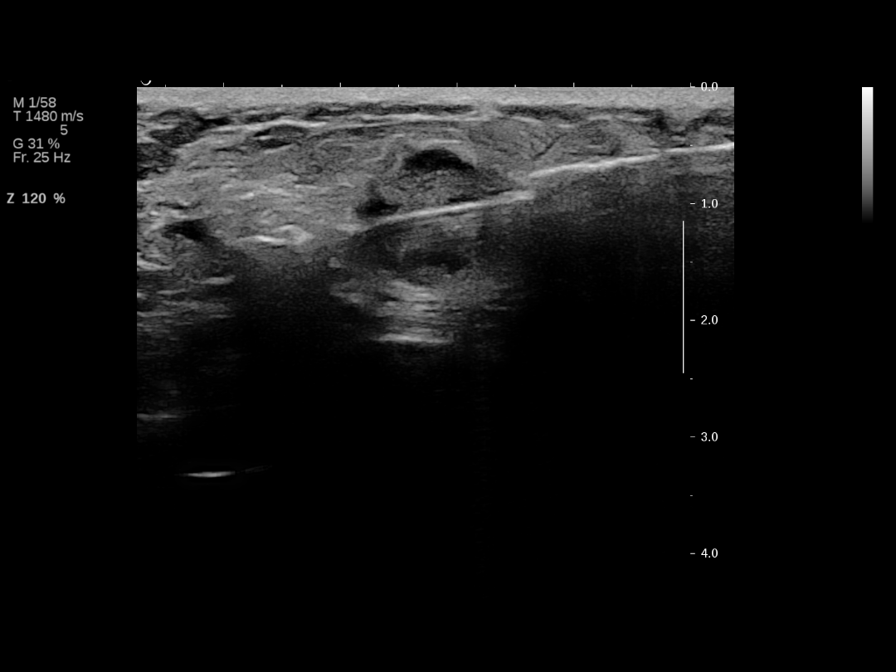
[im 8/9]
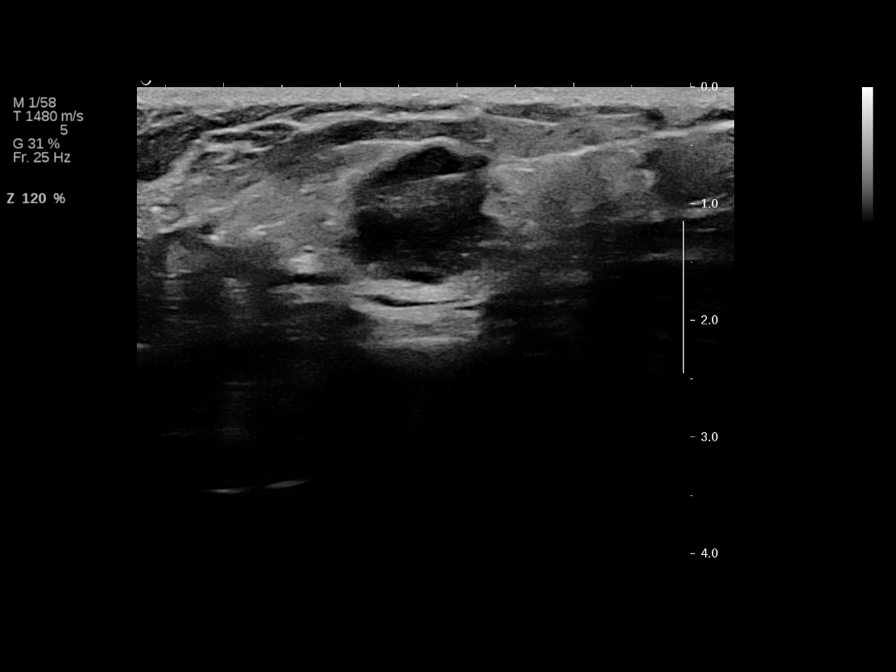
[im 9/9]
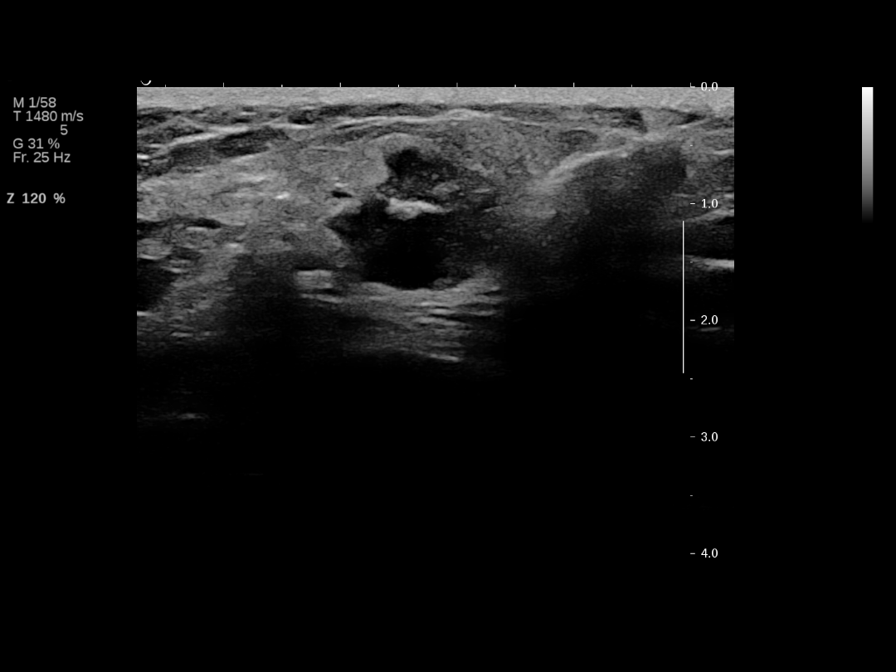

[9 of 9 positions shown; findings below may reference images not displayed]



Using sterile technique and 1% Lidocaine as local anesthetic, under
direct ultrasound visualization, a 14 gauge Jitsuo device was
used to perform biopsy of mass in the right breast at 10 o'clock
using a inferomedial approach. At the conclusion of the procedure a
ribbon shaped tissue marker clip was deployed into the biopsy
cavity. Follow up 2 view mammogram was performed and dictated
separately.
IMPRESSION: Ultrasound guided biopsy of palpable mass in the right breast at 10
o'clock. No apparent complications.

## 2017-09-26 IMAGING — CR DG CHEST 1V PORT
1 series · 1 of 1 positions shown · non-contrast
Comparison: None.

CLINICAL DATA: Port-A-Cath placement

EXAM:
PORTABLE CHEST 1 VIEW

[AP]
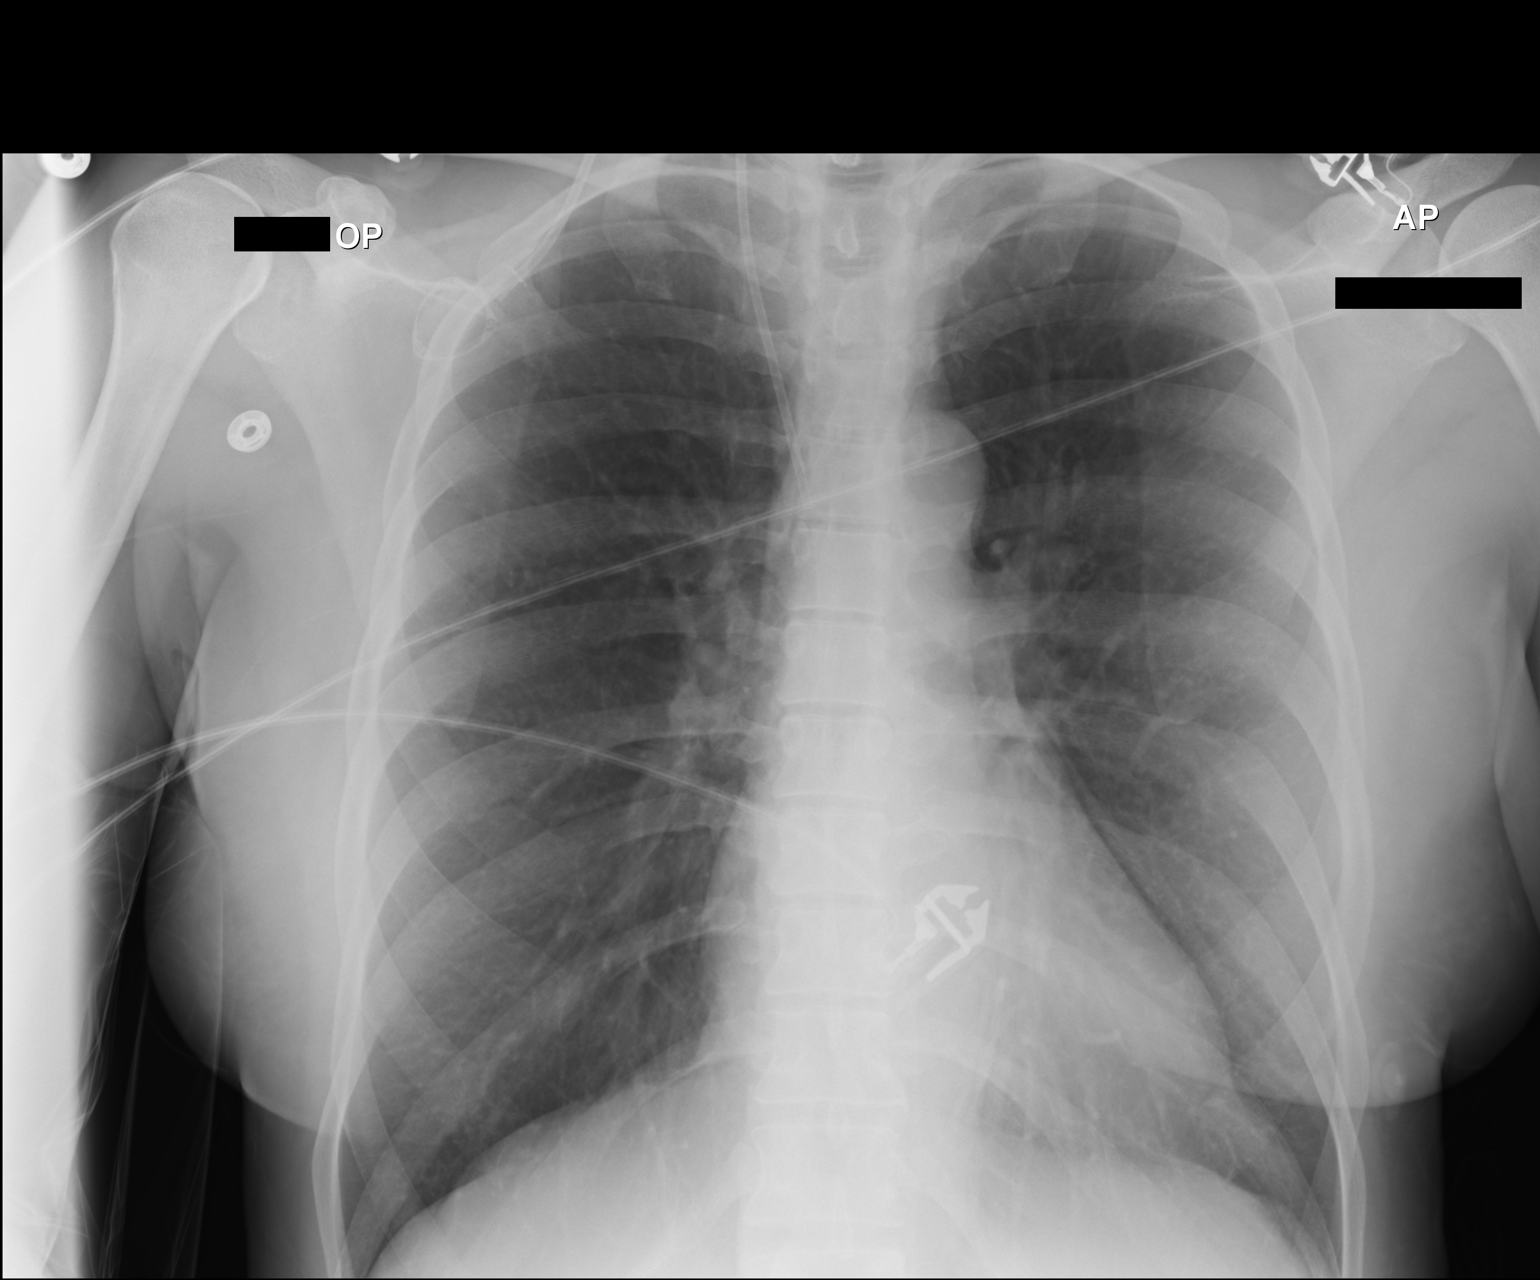

[1 of 1 positions shown; findings below may reference images not displayed]

FINDINGS: The heart size and mediastinal contours are within normal limits.
Right central venous line is identified with distal tip in superior
vena cava. There is no pneumothorax. Small amount of subcutaneous
air is identified in the soft tissues of the lower right neck. Both
lungs are clear. The visualized skeletal structures are
unremarkable.
IMPRESSION: No active cardiopulmonary disease. Right central venous line distal
tip is in the superior vena cava. There is no pneumothorax.

## 2017-10-26 ENCOUNTER — Encounter: Payer: Self-pay | Admitting: Physician Assistant

## 2017-10-28 DIAGNOSIS — L659 Nonscarring hair loss, unspecified: Secondary | ICD-10-CM | POA: Insufficient documentation

## 2017-11-19 ENCOUNTER — Encounter: Payer: Self-pay | Admitting: Physician Assistant

## 2017-11-25 ENCOUNTER — Other Ambulatory Visit: Payer: Self-pay | Admitting: Allergy and Immunology

## 2017-11-25 DIAGNOSIS — J4540 Moderate persistent asthma, uncomplicated: Secondary | ICD-10-CM

## 2017-11-25 DIAGNOSIS — J3089 Other allergic rhinitis: Secondary | ICD-10-CM

## 2017-12-08 ENCOUNTER — Encounter: Payer: Self-pay | Admitting: Physician Assistant

## 2017-12-22 ENCOUNTER — Telehealth: Payer: Self-pay

## 2017-12-22 ENCOUNTER — Telehealth: Payer: Self-pay | Admitting: Allergy and Immunology

## 2017-12-22 ENCOUNTER — Other Ambulatory Visit: Payer: Self-pay | Admitting: Allergy and Immunology

## 2017-12-22 DIAGNOSIS — J4540 Moderate persistent asthma, uncomplicated: Secondary | ICD-10-CM

## 2017-12-22 DIAGNOSIS — J3089 Other allergic rhinitis: Secondary | ICD-10-CM

## 2017-12-22 MED ORDER — MONTELUKAST SODIUM 10 MG PO TABS: 10.0000 mg | ORAL_TABLET | Freq: Every day | ORAL | 0 refills | Status: DC

## 2017-12-22 NOTE — Telephone Encounter (Signed)
Pt called and made appointment on 01/04/2018 and needs to have singulair called into her [facility]. [phone]

## 2017-12-22 NOTE — Telephone Encounter (Signed)
Refill sent in patient notified last refill until seen advised to keep appt for further refills patient verbalized understanding

## 2017-12-30 NOTE — Telephone Encounter (Signed)
Patient's information was added. I called pt and left a VM

## 2018-01-04 ENCOUNTER — Encounter: Payer: Self-pay | Admitting: Allergy and Immunology

## 2018-01-04 ENCOUNTER — Ambulatory Visit (INDEPENDENT_AMBULATORY_CARE_PROVIDER_SITE_OTHER): Payer: BLUE CROSS/BLUE SHIELD | Admitting: Allergy and Immunology

## 2018-01-04 VITALS — BP 110/80 | HR 84 | Resp 16 | Ht 66.0 in | Wt 141.0 lb

## 2018-01-04 DIAGNOSIS — J0100 Acute maxillary sinusitis, unspecified: Secondary | ICD-10-CM | POA: Diagnosis not present

## 2018-01-04 DIAGNOSIS — J4540 Moderate persistent asthma, uncomplicated: Secondary | ICD-10-CM

## 2018-01-04 DIAGNOSIS — J019 Acute sinusitis, unspecified: Secondary | ICD-10-CM | POA: Insufficient documentation

## 2018-01-04 MED ORDER — FLUTICASONE PROPIONATE HFA 110 MCG/ACT IN AERO: 2.0000 | INHALATION_SPRAY | Freq: Two times a day (BID) | RESPIRATORY_TRACT | 5 refills | Status: DC

## 2018-01-04 MED ORDER — PREDNISONE 1 MG PO TABS: 10.0000 mg | ORAL_TABLET | Freq: Every day | ORAL | Status: DC

## 2018-01-04 MED ORDER — FLUTICASONE PROPIONATE 93 MCG/ACT NA EXHU: 2.0000 | INHALANT_SUSPENSION | Freq: Two times a day (BID) | NASAL | 5 refills | Status: DC

## 2018-01-04 NOTE — Progress Notes (Signed)
Follow-up Note  RE: [name] MRN: [id number] DOB: Apr 19, 1965 Date of Office Visit: 01/04/2018  Primary care provider: Vania Rea, MD Referring provider: Vania Rea, MD  History of present illness: Courtney Gomez is a 52 y.o. female with moderate persistent asthma, allergic rhinitis, and history of pruritus presenting today for a sick visit.  She was previously seen in this clinic in December 2018.  She reports that one week ago she stayed in a "pet friendly" hotel and since that time has experienced increased nasal congestion, sneezing, and maxillary sinus pressure.  She denies fevers, chills, or discolored mucus production.  She admits that she ran out of fluticasone nasal spray a month or so ago and had only been using it sporadically.  In addition, she ran out of Flovent HFA approximately 2 months ago but had some extra Advair 250/50 Diskus at her home and so started taking that medication instead.  Her asthma has been well controlled over the past several months.  She rarely requires albuterol rescue, does not experience nocturnal awakenings due to lower respiratory symptoms, and does not experience limitations in normal daily activities due to asthma symptoms.  Assessment and plan: Acute sinusitis  Prednisone has been provided, 40 mg x3 days, 20 mg x1 day, 10 mg x1 day, then stop.  A prescription has been provided for [facility], 2 actuations per nostril twice a day. Proper technique has been discussed and demonstrated.  Nasal saline lavage (NeilMed) has been recommended as needed and prior to medicated nasal sprays along with instructions for proper administration.  Add guaifenesin 1200 mg (Mucinex Maximum Strength) plus/minus pseudoephedrine 120 mg  twice daily as needed with adequate hydration as discussed. Pseudoephedrine is only to be used for short-term relief of nasal/sinus congestion. Long-term use is discouraged due to potential side effects.  The patient has been  asked to contact me if her symptoms persist, progress, or if she becomes febrile. Otherwise, she may return for follow up in 5 months.  Moderate persistent asthma  Continue Advair 250-50 g, 1 inhalation twice daily sinus infection has resolved.  At that time, switch to Flovent 110 g, 2 inhalations via spacer device twice daily.  A prescription has been provided for Flovent 110 g, 2 inhalations via spacer device twice daily.  During respiratory tract infections or asthma flares, increase Flovent 110g to 3 inhalations 3 times per day until symptoms have returned to baseline.  Continue montelukast 10 mg daily bedtime and albuterol HFA, 1 to 2 inhalations every 4-6 hours if needed.  Subjective and objective measures of pulmonary function will be followed and the treatment plan will be adjusted accordingly.   Meds ordered this encounter  Medications  . Fluticasone Propionate (XHANCE) 93 MCG/ACT EXHU    Sig: Place 2 puffs into the nose 2 (two) times daily.    Dispense:  16 mL    Refill:  5    Please call [phone] for delivery.  . fluticasone (FLOVENT HFA) 110 MCG/ACT inhaler    Sig: Inhale 2 puffs into the lungs 2 (two) times daily.    Dispense:  1 Inhaler    Refill:  5  . predniSONE (DELTASONE) tablet 10 mg    Diagnostics: Spirometry reveals an FVC of 3.42 L (91% predicted) and an FEV1 of 2.27 L (77% predicted) without significant postbronchodilator improvement (110 mL, 5%).  This study was performed while the patient was asymptomatic.  Please see scanned spirometry results for details.    Physical examination: Blood  pressure 110/80, pulse 84, resp. rate 16, height 5\' 6"  (1.676 m), weight 141 lb (64 kg), SpO2 99 %.  General: Alert, interactive, in no acute distress. HEENT: TMs pearly gray, turbinates edematous with thick discharge, post-pharynx erythematous. Neck: Supple without lymphadenopathy. Lungs: Clear to auscultation without wheezing, rhonchi or rales. CV: Normal S1,  S2 without murmurs. Skin: Warm and dry, without lesions or rashes.  The following portions of the patient's history were reviewed and updated as appropriate: allergies, current medications, past family history, past medical history, past social history, past surgical history and problem list.  Allergies as of 01/04/2018      Reactions   Lanolin Itching   Latex Itching, Swelling      Medication List        Accurate as of 01/04/18  6:13 PM. Always use your most recent med list.          cetirizine 10 MG tablet Commonly known as:  ZYRTEC Take 10 mg by mouth daily as needed. Reported on 08/23/2015   fluticasone 110 MCG/ACT inhaler Commonly known as:  FLOVENT HFA Inhale 2 puffs into the lungs 2 (two) times daily.   fluticasone 110 MCG/ACT inhaler Commonly known as:  FLOVENT HFA Inhale 2 puffs into the lungs 2 (two) times daily.   fluticasone 50 MCG/ACT nasal spray Commonly known as:  FLONASE PLACE 1 SPRAY INTO BOTH NOSTRILS DAILY.   Fluticasone Propionate 93 MCG/ACT Exhu Place 2 puffs into the nose 2 (two) times daily.   montelukast 10 MG tablet Commonly known as:  SINGULAIR Take 1 tablet (10 mg total) by mouth at bedtime.   non-metallic deodorant Misc Commonly known as:  ALRA Apply 1 application topically daily as needed.   PROAIR HFA 108 (90 Base) MCG/ACT inhaler Generic drug:  albuterol Inhale 2 puffs into the lungs every 6 (six) hours as needed for wheezing or shortness of breath. Reported on 08/23/2015       Allergies  Allergen Reactions  . Lanolin Itching  . Latex Itching and Swelling   Review of systems: Review of systems negative except as noted in HPI / PMHx or noted below: Constitutional: Negative.  HENT: Negative.   Eyes: Negative.  Respiratory: Negative.   Cardiovascular: Negative.  Gastrointestinal: Negative.  Genitourinary: Negative.  Musculoskeletal: Negative.  Neurological: Negative.  Endo/Heme/Allergies: Negative.  Cutaneous:  Negative.  Past Medical History:  Diagnosis Date  . Anxiety    not currently  . Asthma   . Breast cancer ([place])   . Breast cancer of upper-outer quadrant of right female breast ([place]) 07/15/2015  . Eczema   . History of motion sickness   . Personal history of chemotherapy   . Personal history of radiation therapy   . PONV (postoperative nausea and vomiting)   . Tinnitus   . Vertigo     Family History  Problem Relation Age of Onset  . Colon polyps Mother        hx of one large colon polyp s/p resection  . Other Mother        hx of benign teratoma and molar pregnancy  . Prostate cancer Father 44  . Colon polyps Father        possible colon polyp hx; +diverticulitis  . Diverticulitis Father   . Other Maternal Uncle        hx of unspecified "lump on his back"  . Lupus Paternal Aunt   . Heart attack Paternal Uncle        d. 50s; smoker  .  Heart Problems Paternal Uncle   . Colon cancer Maternal Grandmother 63       possible colon cancer  . Lung cancer Maternal Grandfather 74       smoker; +EtOH  . COPD Paternal Grandmother   . Heart Problems Paternal Grandmother   . Heart Problems Paternal Grandfather   . Prostate cancer Paternal Grandfather        possible prostate cancer dx. early 78s  . Stomach cancer Other        maternal great grandmother (MGF's mother); dx. late 56s  . Colon cancer Other 71       paternal great grandmother (PGM's mother)  . Stomach cancer Other        paternal great uncles (PGF's brother) d. late 49s-early 60s    Social History   Socioeconomic History  . Marital status: Legally Separated    Spouse name: Not on file  . Number of children: 2  . Years of education: Not on file  . Highest education level: Not on file  Occupational History  . Not on file  Social Needs  . Financial resource strain: Not on file  . Food insecurity:    Worry: Not on file    Inability: Not on file  . Transportation needs:    Medical: Not on file    Non-medical:  Not on file  Tobacco Use  . Smoking status: Never Smoker  . Smokeless tobacco: Never Used  Substance and Sexual Activity  . Alcohol use: Yes    Alcohol/week: 0.0 standard drinks    Comment: ocassional  . Drug use: No  . Sexual activity: Not Currently    Comment: Was on BCP stopped 07/13/15  Lifestyle  . Physical activity:    Days per week: Not on file    Minutes per session: Not on file  . Stress: Not on file  Relationships  . Social connections:    Talks on phone: Not on file    Gets together: Not on file    Attends religious service: Not on file    Active member of club or organization: Not on file    Attends meetings of clubs or organizations: Not on file    Relationship status: Not on file  . Intimate partner violence:    Fear of current or ex partner: Not on file    Emotionally abused: Not on file    Physically abused: Not on file    Forced sexual activity: Not on file  Other Topics Concern  . Not on file  Social History Narrative  . Not on file    I appreciate the opportunity to take part in [place] care. Please do not hesitate to contact me with questions.  Sincerely,   R. Edgar Frisk, MD

## 2018-01-04 NOTE — Assessment & Plan Note (Signed)
   Continue Advair 250-50 g, 1 inhalation twice daily sinus infection has resolved.  At that time, switch to Flovent 110 g, 2 inhalations via spacer device twice daily.  A prescription has been provided for Flovent 110 g, 2 inhalations via spacer device twice daily.  During respiratory tract infections or asthma flares, increase Flovent 110g to 3 inhalations 3 times per day until symptoms have returned to baseline.  Continue montelukast 10 mg daily bedtime and albuterol HFA, 1 to 2 inhalations every 4-6 hours if needed.  Subjective and objective measures of pulmonary function will be followed and the treatment plan will be adjusted accordingly.

## 2018-01-04 NOTE — Patient Instructions (Addendum)
Acute sinusitis  Prednisone has been provided, 40 mg x3 days, 20 mg x1 day, 10 mg x1 day, then stop.  A prescription has been provided for [facility], 2 actuations per nostril twice a day. Proper technique has been discussed and demonstrated.  Nasal saline lavage (NeilMed) has been recommended as needed and prior to medicated nasal sprays along with instructions for proper administration.  Add guaifenesin 1200 mg (Mucinex Maximum Strength) plus/minus pseudoephedrine 120 mg  twice daily as needed with adequate hydration as discussed. Pseudoephedrine is only to be used for short-term relief of nasal/sinus congestion. Long-term use is discouraged due to potential side effects.  The patient has been asked to contact me if her symptoms persist, progress, or if she becomes febrile. Otherwise, she may return for follow up in 5 months.  Moderate persistent asthma  Continue Advair 250-50 g, 1 inhalation twice daily sinus infection has resolved.  At that time, switch to Flovent 110 g, 2 inhalations via spacer device twice daily.  A prescription has been provided for Flovent 110 g, 2 inhalations via spacer device twice daily.  During respiratory tract infections or asthma flares, increase Flovent 110g to 3 inhalations 3 times per day until symptoms have returned to baseline.  Continue montelukast 10 mg daily bedtime and albuterol HFA, 1 to 2 inhalations every 4-6 hours if needed.  Subjective and objective measures of pulmonary function will be followed and the treatment plan will be adjusted accordingly.   Return in about 5 months (around 06/05/2018), or if symptoms worsen or fail to improve.

## 2018-01-04 NOTE — Assessment & Plan Note (Signed)
   Prednisone has been provided, 40 mg x3 days, 20 mg x1 day, 10 mg x1 day, then stop.  A prescription has been provided for [facility], 2 actuations per nostril twice a day. Proper technique has been discussed and demonstrated.  Nasal saline lavage (NeilMed) has been recommended as needed and prior to medicated nasal sprays along with instructions for proper administration.  Add guaifenesin 1200 mg (Mucinex Maximum Strength) plus/minus pseudoephedrine 120 mg  twice daily as needed with adequate hydration as discussed. Pseudoephedrine is only to be used for short-term relief of nasal/sinus congestion. Long-term use is discouraged due to potential side effects.  The patient has been asked to contact me if her symptoms persist, progress, or if she becomes febrile. Otherwise, she may return for follow up in 5 months.

## 2018-01-13 ENCOUNTER — Other Ambulatory Visit: Payer: Self-pay | Admitting: Allergy and Immunology

## 2018-01-13 DIAGNOSIS — J4540 Moderate persistent asthma, uncomplicated: Secondary | ICD-10-CM

## 2018-01-13 DIAGNOSIS — J3089 Other allergic rhinitis: Secondary | ICD-10-CM

## 2018-02-22 ENCOUNTER — Other Ambulatory Visit: Payer: Self-pay | Admitting: Allergy and Immunology

## 2018-02-22 DIAGNOSIS — J3089 Other allergic rhinitis: Secondary | ICD-10-CM

## 2018-02-22 DIAGNOSIS — J4540 Moderate persistent asthma, uncomplicated: Secondary | ICD-10-CM

## 2018-02-23 ENCOUNTER — Inpatient Hospital Stay: Payer: BLUE CROSS/BLUE SHIELD | Attending: Medical | Admitting: Medical

## 2018-02-23 ENCOUNTER — Encounter: Payer: Self-pay | Admitting: Hematology and Oncology

## 2018-02-23 ENCOUNTER — Telehealth: Payer: Self-pay | Admitting: Emergency Medicine

## 2018-02-23 ENCOUNTER — Other Ambulatory Visit: Payer: Self-pay | Admitting: Medical

## 2018-02-23 VITALS — BP 149/98 | HR 89 | Temp 98.0°F | Resp 18 | Ht 66.0 in | Wt 140.6 lb

## 2018-02-23 DIAGNOSIS — Z923 Personal history of irradiation: Secondary | ICD-10-CM

## 2018-02-23 DIAGNOSIS — Z9221 Personal history of antineoplastic chemotherapy: Secondary | ICD-10-CM | POA: Diagnosis not present

## 2018-02-23 DIAGNOSIS — N6320 Unspecified lump in the left breast, unspecified quadrant: Secondary | ICD-10-CM | POA: Insufficient documentation

## 2018-02-23 DIAGNOSIS — Z170 Estrogen receptor positive status [ER+]: Secondary | ICD-10-CM | POA: Insufficient documentation

## 2018-02-23 DIAGNOSIS — C50411 Malignant neoplasm of upper-outer quadrant of right female breast: Secondary | ICD-10-CM | POA: Insufficient documentation

## 2018-02-23 NOTE — Progress Notes (Signed)
Pt presents today with soft, freely mobile mass under L armpit that she found this morning.  Denies pain or tenderness.  Denies changes in breast or skin.  No redness or injury.

## 2018-02-23 NOTE — Telephone Encounter (Signed)
Called pt to talk about her MyChart message.  Pt states that she found a lump this morning under her armpit on the same side as her previous breast cancer.  It is soft and mobile, no pain or tenderness or redness or drainage from that breast.  Pt to come in today for [facility] appt to have it checked.  RN May for MD Gudena aware.

## 2018-02-23 NOTE — Patient Instructions (Signed)
Mammogram A mammogram is an X-ray of the breasts that is done to check for abnormal changes. This procedure can screen for and detect any changes that may suggest breast cancer. A mammogram can also identify other changes and variations in the breast, such as:  Inflammation of the breast tissue (mastitis).  An infected area that contains a collection of pus (abscess).  A fluid-filled sac (cyst).  Fibrocystic changes. This is when breast tissue becomes denser, which can make the tissue feel rope-like or uneven under the skin.  Tumors that are not cancerous (benign).  Tell a health care provider about:  Any allergies you have.  If you have breast implants.  If you have had previous breast disease, biopsy, or surgery.  If you are breastfeeding.  Any possibility that you could be pregnant, if this applies.  If you are younger than age 78.  If you have a family history of breast cancer. What are the risks? Generally, this is a safe procedure. However, problems may occur, including:  Exposure to radiation. Radiation levels are very low with this test.  The results being misinterpreted.  The need for further tests.  The inability of the mammogram to detect certain cancers.  What happens before the procedure?  Schedule your test about 1-2 weeks after your menstrual period. This is usually when your breasts are the least tender.  If you have had a mammogram done at a different facility in the past, get the mammogram X-rays or have them sent to your current exam facility in order to compare them.  Wash your breasts and under your arms the day of the test.  Do not wear deodorants, perfumes, lotions, or powders anywhere on your body on the day of the test.  Remove any jewelry from your neck.  Wear clothes that you can change into and out of easily. What happens during the procedure?  You will undress from the waist up and put on a gown.  You will stand in front of the  X-ray machine.  Each breast will be placed between two plastic or glass plates. The plates will compress your breast for a few seconds. Try to stay as relaxed as possible during the procedure. This does not cause any harm to your breasts and any discomfort you feel will be very brief.  X-rays will be taken from different angles of each breast. The procedure may vary among health care providers and hospitals. What happens after the procedure?  The mammogram will be examined by a specialist (radiologist).  You may need to repeat certain parts of the test, depending on the quality of the images. This is commonly done if the radiologist needs a better view of the breast tissue.  Ask when your test results will be ready. Make sure you get your test results.  You may resume your normal activities. This information is not intended to replace advice given to you by your health care provider. Make sure you discuss any questions you have with your health care provider. Document Released: 03/13/2000 Document Revised: 08/19/2015 Document Reviewed: 05/25/2014 Elsevier Interactive Patient Education  [employer].

## 2018-02-23 NOTE — Progress Notes (Signed)
[facility] Progress Note   Courtney Gomez [id number] 02-08-1966 52 y.o.    Courtney Gomez is managed by Dr. Nicholas Lose  Actively treated with chemotherapy/immunotherapy/hormonal therapy: no  Assessment: Plan:    Left breast mass - Plan: MM DIAG BREAST TOMO UNI LEFT, CANCELED: MM Digital Diagnostic Bilat   Cystic left breast mass: The patient was referred for a diagnostic left unilateral mammogram with ultrasound as needed.  This has tentatively been scheduled for 03/03/2018 at 1:20 PM.  The patient has been given contact information to call to see if there might be an earlier appointment.  Please see After Visit Summary for patient specific instructions.  Future Appointments  Date Time Provider [place]  03/03/2018  1:40 PM [facility] DIAG TOMO 1 [facility] GI-BREAST CE  03/03/2018  1:50 PM [facility] [place] 1 [facility] GI-BREAST CE  06/07/2018  4:00 PM Bobbitt, Sedalia Muta, MD [facility] None  07/13/2018  8:30 AM Nicholas Lose, MD [facility] None    Orders Placed This Encounter  Procedures  . MM DIAG BREAST TOMO UNI LEFT       Subjective:   Patient ID:  Courtney Gomez is a 52 y.o. (DOB [birth year]) female.  Chief Complaint:  Chief Complaint  Patient presents with  . Mass    HPI Courtney Gomez is a 52 year old female with a history of an ER positive malignant neoplasm of the right breast who is followed by Dr. Lindi Adie.  She was treated with neoadjuvant dose dense AC followed by T.  This was switched to Abraxane after cycle 2 chemotherapy.  She completed 12 weekly cycles.  She had a lumpectomy completed on 01/08/2016.  This was followed by 28 fractions of radiation therapy and 5 boost fractions.  She noted a "lump" under her left armpit earlier today.  She reports that the area is freely mobile and is without pain or erythema.  She has had no changes in activity or trauma.  She denies fevers, chills, sweats, or unexpected weight changes.  Medications: I  have reviewed the patient's current medications.  Allergies:  Allergies  Allergen Reactions  . Lanolin Itching  . Latex Itching and Swelling    Past Medical History:  Diagnosis Date  . Anxiety    not currently  . Asthma   . Breast cancer ([place])   . Breast cancer of upper-outer quadrant of right female breast ([place]) 07/15/2015  . Eczema   . History of motion sickness   . Personal history of chemotherapy   . Personal history of radiation therapy   . PONV (postoperative nausea and vomiting)   . Tinnitus   . Vertigo     Past Surgical History:  Procedure Laterality Date  . BREAST BIOPSY Right 08/07/2015  . BREAST BIOPSY Right 07/11/2015  . BREAST LUMPECTOMY Right 01/08/2016  . DENTAL SURGERY  1982   impacted teeth  . PORT-A-CATH REMOVAL N/A 03/16/2016   Procedure: REMOVAL PORT-A-CATH;  Surgeon: Rolm Bookbinder, MD;  Location: [facility];  Service: General;  Laterality: N/A;  . PORTACATH PLACEMENT Right 07/31/2015   Procedure: INSERTION PORT-A-CATH WITH ULTRASOUND ;  Surgeon: Rolm Bookbinder, MD;  Location: [facility];  Service: General;  Laterality: Right;  . RADIOACTIVE SEED GUIDED PARTIAL MASTECTOMY WITH AXILLARY SENTINEL LYMPH NODE BIOPSY Right 01/08/2016   Procedure: RADIOACTIVE SEED LUMPECTOMY  WITH AXILLARY SENTINEL LYMPH NODE BIOPSY AND BLUE DYE INJECTION;  Surgeon: Rolm Bookbinder, MD;  Location: [facility];  Service: General;  Laterality: Right;  Family History  Problem Relation Age of Onset  . Colon polyps Mother        hx of one large colon polyp s/p resection  . Other Mother        hx of benign teratoma and molar pregnancy  . Prostate cancer Father 2  . Colon polyps Father        possible colon polyp hx; +diverticulitis  . Diverticulitis Father   . Other Maternal Uncle        hx of unspecified "lump on his back"  . Lupus Paternal Aunt   . Heart attack Paternal Uncle        d. 82s; smoker  . Heart Problems Paternal Uncle   . Colon cancer Maternal  Grandmother 63       possible colon cancer  . Lung cancer Maternal Grandfather 40       smoker; +EtOH  . COPD Paternal Grandmother   . Heart Problems Paternal Grandmother   . Heart Problems Paternal Grandfather   . Prostate cancer Paternal Grandfather        possible prostate cancer dx. early 80s  . Stomach cancer Other        maternal great grandmother (MGF's mother); dx. late 36s  . Colon cancer Other 60       paternal great grandmother (PGM's mother)  . Stomach cancer Other        paternal great uncles (PGF's brother) d. late 81s-early 60s    Social History   Socioeconomic History  . Marital status: Legally Separated    Spouse name: Not on file  . Number of children: 2  . Years of education: Not on file  . Highest education level: Not on file  Occupational History  . Not on file  Social Needs  . Financial resource strain: Not on file  . Food insecurity:    Worry: Not on file    Inability: Not on file  . Transportation needs:    Medical: Not on file    Non-medical: Not on file  Tobacco Use  . Smoking status: Never Smoker  . Smokeless tobacco: Never Used  Substance and Sexual Activity  . Alcohol use: Yes    Alcohol/week: 0.0 standard drinks    Comment: ocassional  . Drug use: No  . Sexual activity: Not Currently    Comment: Was on BCP stopped 07/13/15  Lifestyle  . Physical activity:    Days per week: Not on file    Minutes per session: Not on file  . Stress: Not on file  Relationships  . Social connections:    Talks on phone: Not on file    Gets together: Not on file    Attends religious service: Not on file    Active member of club or organization: Not on file    Attends meetings of clubs or organizations: Not on file    Relationship status: Not on file  . Intimate partner violence:    Fear of current or ex partner: Not on file    Emotionally abused: Not on file    Physically abused: Not on file    Forced sexual activity: Not on file  Other Topics  Concern  . Not on file  Social History Narrative  . Not on file    Past Medical History, Surgical history, Social history, and Family history were reviewed and updated as appropriate.   Please see review of systems for further details on the patient's review from today.  Review of Systems:  Review of Systems  Constitutional: Negative for activity change, chills, diaphoresis, fever and unexpected weight change.  Skin:       Freely mobile mass in the left lateral breast.    Objective:   Physical Exam:  BP (!) 149/98 (BP Location: Left Arm, Patient Position: Sitting) Comment: told RN  Pulse 89   Temp 98 F (36.7 C) (Oral)   Resp 18   Ht 5\' 6"  (1.676 m)   Wt 140 lb 9.6 oz (63.8 kg)   SpO2 100%   BMI 22.69 kg/m  ECOG: 0  Physical Exam  Constitutional: No distress.  HENT:  Head: Normocephalic and atraumatic.  Cardiovascular: Normal rate, regular rhythm and normal heart sounds. Exam reveals no gallop and no friction rub.  No murmur heard.   Pulmonary/Chest: Effort normal and breath sounds normal. No respiratory distress. She has no wheezes. She has no rales.  Lymphadenopathy:       Head (right side): No submental, no submandibular, no preauricular, no posterior auricular and no occipital adenopathy present.       Head (left side): No submental, no submandibular, no preauricular, no posterior auricular and no occipital adenopathy present.    She has no cervical adenopathy.       Right cervical: No superficial cervical, no deep cervical and no posterior cervical adenopathy present.      Left cervical: No superficial cervical, no deep cervical and no posterior cervical adenopathy present.    She has no axillary adenopathy.       Right: No supraclavicular and no epitrochlear adenopathy present.       Left: No supraclavicular and no epitrochlear adenopathy present.  Neurological: She is alert.  Skin: Skin is warm and dry. No rash noted. She is not diaphoretic. No erythema.     Lab Review:     Component Value Date/Time   NA 143 08/27/2017 1744   NA 144 12/20/2015 0820   K 4.5 08/27/2017 1744   K 4.2 12/20/2015 0820   CL 104 08/27/2017 1744   CO2 27 08/27/2017 1744   CO2 28 12/20/2015 0820   GLUCOSE 98 08/27/2017 1744   GLUCOSE 101 (H) 03/16/2016 0908   GLUCOSE 104 12/20/2015 0820   BUN 9 08/27/2017 1744   BUN 10.7 12/20/2015 0820   CREATININE 0.76 08/27/2017 1744   CREATININE 0.8 12/20/2015 0820   CALCIUM 9.8 08/27/2017 1744   CALCIUM 9.6 12/20/2015 0820   PROT 7.2 08/27/2017 1744   PROT 6.6 12/20/2015 0820   ALBUMIN 4.6 08/27/2017 1744   ALBUMIN 3.5 12/20/2015 0820   AST 16 08/27/2017 1744   AST 19 12/20/2015 0820   ALT 16 08/27/2017 1744   ALT 29 12/20/2015 0820   ALKPHOS 93 08/27/2017 1744   ALKPHOS 80 12/20/2015 0820   BILITOT 0.3 08/27/2017 1744   BILITOT 0.42 12/20/2015 0820   GFRNONAA 90 08/27/2017 1744   GFRAA 104 08/27/2017 1744       Component Value Date/Time   WBC 5.3 08/27/2017 1744   WBC 8.9 06/07/2017 1610   WBC 3.9 (L) 03/16/2016 0908   RBC 4.16 08/27/2017 1744   RBC 4.15 06/07/2017 1610   RBC 4.09 03/16/2016 0908   HGB 13.4 08/27/2017 1744   HGB 11.6 12/20/2015 0820   HCT 38.9 08/27/2017 1744   HCT 35.0 12/20/2015 0820   PLT 226 08/27/2017 1744   MCV 94 08/27/2017 1744   MCV 99.9 12/20/2015 0820   MCH 32.2 08/27/2017 1744   MCH  32.1 (A) 06/07/2017 1610   MCH 32.5 03/16/2016 0908   MCHC 34.4 08/27/2017 1744   MCHC 33.5 06/07/2017 1610   MCHC 33.3 03/16/2016 0908   RDW 13.3 08/27/2017 1744   RDW 14.3 12/20/2015 0820   LYMPHSABS 2.1 08/27/2017 1744   LYMPHSABS 0.8 (L) 12/20/2015 0820   MONOABS 0.3 12/20/2015 0820   EOSABS 0.2 08/27/2017 1744   BASOSABS 0.0 08/27/2017 1744   BASOSABS 0.0 12/20/2015 0820   -------------------------------  Imaging from last 24 hours (if applicable):  Radiology interpretation: No results found.

## 2018-02-25 ENCOUNTER — Ambulatory Visit: Payer: Self-pay | Admitting: Family

## 2018-02-25 ENCOUNTER — Encounter: Payer: Self-pay | Admitting: Family

## 2018-02-25 ENCOUNTER — Telehealth: Payer: Self-pay

## 2018-02-25 VITALS — BP 115/80 | HR 100 | Temp 98.9°F | Wt 139.0 lb

## 2018-02-25 DIAGNOSIS — B029 Zoster without complications: Secondary | ICD-10-CM

## 2018-02-25 MED ORDER — VALACYCLOVIR HCL 1 G PO TABS: 1000.0000 mg | ORAL_TABLET | Freq: Three times a day (TID) | ORAL | 0 refills | Status: DC

## 2018-02-25 NOTE — Progress Notes (Signed)
Subjective:     Patient ID: Courtney Gomez, female   DOB: [birth year], 52 y.o.   MRN: [id number]  HPI 52 year female is in today with c/o a itchy, red, rash to the right stomach and lower back x 4 days. She reports having a tingling sensation under the skin. No pain. Reports increased stress.   Review of Systems  Constitutional: Negative.   Respiratory: Negative.   Cardiovascular: Negative.   Skin: Positive for rash.  Allergic/Immunologic: Negative.   Psychiatric/Behavioral: Negative.    Past Medical History:  Diagnosis Date  . Anxiety    not currently  . Asthma   . Breast cancer ([place])   . Breast cancer of upper-outer quadrant of right female breast ([place]) 07/15/2015  . Eczema   . History of motion sickness   . Personal history of chemotherapy   . Personal history of radiation therapy   . PONV (postoperative nausea and vomiting)   . Tinnitus   . Vertigo     Social History   Socioeconomic History  . Marital status: Legally Separated    Spouse name: Not on file  . Number of children: 2  . Years of education: Not on file  . Highest education level: Not on file  Occupational History  . Not on file  Social Needs  . Financial resource strain: Not on file  . Food insecurity:    Worry: Not on file    Inability: Not on file  . Transportation needs:    Medical: Not on file    Non-medical: Not on file  Tobacco Use  . Smoking status: Never Smoker  . Smokeless tobacco: Never Used  Substance and Sexual Activity  . Alcohol use: Yes    Alcohol/week: 0.0 standard drinks    Comment: ocassional  . Drug use: No  . Sexual activity: Not Currently    Comment: Was on BCP stopped 07/13/15  Lifestyle  . Physical activity:    Days per week: Not on file    Minutes per session: Not on file  . Stress: Not on file  Relationships  . Social connections:    Talks on phone: Not on file    Gets together: Not on file    Attends religious service: Not on file    Active member of club or  organization: Not on file    Attends meetings of clubs or organizations: Not on file    Relationship status: Not on file  . Intimate partner violence:    Fear of current or ex partner: Not on file    Emotionally abused: Not on file    Physically abused: Not on file    Forced sexual activity: Not on file  Other Topics Concern  . Not on file  Social History Narrative  . Not on file    Past Surgical History:  Procedure Laterality Date  . BREAST BIOPSY Right 08/07/2015  . BREAST BIOPSY Right 07/11/2015  . BREAST LUMPECTOMY Right 01/08/2016  . DENTAL SURGERY  1982   impacted teeth  . PORT-A-CATH REMOVAL N/A 03/16/2016   Procedure: REMOVAL PORT-A-CATH;  Surgeon: Rolm Bookbinder, MD;  Location: [facility];  Service: General;  Laterality: N/A;  . PORTACATH PLACEMENT Right 07/31/2015   Procedure: INSERTION PORT-A-CATH WITH ULTRASOUND ;  Surgeon: Rolm Bookbinder, MD;  Location: [facility];  Service: General;  Laterality: Right;  . RADIOACTIVE SEED GUIDED PARTIAL MASTECTOMY WITH AXILLARY SENTINEL LYMPH NODE BIOPSY Right 01/08/2016   Procedure: RADIOACTIVE SEED LUMPECTOMY  WITH AXILLARY SENTINEL  LYMPH NODE BIOPSY AND BLUE DYE INJECTION;  Surgeon: Rolm Bookbinder, MD;  Location: [facility];  Service: General;  Laterality: Right;    Family History  Problem Relation Age of Onset  . Colon polyps Mother        hx of one large colon polyp s/p resection  . Other Mother        hx of benign teratoma and molar pregnancy  . Prostate cancer Father 2  . Colon polyps Father        possible colon polyp hx; +diverticulitis  . Diverticulitis Father   . Other Maternal Uncle        hx of unspecified "lump on his back"  . Lupus Paternal Aunt   . Heart attack Paternal Uncle        d. 49s; smoker  . Heart Problems Paternal Uncle   . Colon cancer Maternal Grandmother 63       possible colon cancer  . Lung cancer Maternal Grandfather 73       smoker; +EtOH  . COPD Paternal Grandmother   .  Heart Problems Paternal Grandmother   . Heart Problems Paternal Grandfather   . Prostate cancer Paternal Grandfather        possible prostate cancer dx. early 37s  . Stomach cancer Other        maternal great grandmother (MGF's mother); dx. late 37s  . Colon cancer Other 34       paternal great grandmother (PGM's mother)  . Stomach cancer Other        paternal great uncles (PGF's brother) d. late 50s-early 19s    Allergies  Allergen Reactions  . Lanolin Itching  . Latex Itching and Swelling    Current Outpatient Medications on File Prior to Visit  Medication Sig Dispense Refill  . albuterol (PROAIR HFA) 108 (90 BASE) MCG/ACT inhaler Inhale 2 puffs into the lungs every 6 (six) hours as needed for wheezing or shortness of breath. Reported on 08/23/2015    . betamethasone, augmented, (DIPROLENE) 0.05 % lotion APPLY TO AFFECTED AREA DAILY  6  . Biotin 1 MG CAPS Take by mouth.    . cetirizine (ZYRTEC) 10 MG tablet Take 10 mg by mouth daily as needed. Reported on 08/23/2015    . fluticasone (FLONASE) 50 MCG/ACT nasal spray PLACE 1 SPRAY INTO BOTH NOSTRILS DAILY. 16 g 2  . fluticasone (FLOVENT HFA) 110 MCG/ACT inhaler Inhale 2 puffs into the lungs 2 (two) times daily. 1 Inhaler 5  . montelukast (SINGULAIR) 10 MG tablet TAKE 1 TABLET BY MOUTH EVERYDAY AT BEDTIME 30 tablet 3  . non-metallic deodorant (ALRA) MISC Apply 1 application topically daily as needed.     Current Facility-Administered Medications on File Prior to Visit  Medication Dose Route Frequency Provider Last Rate Last Dose  . predniSONE (DELTASONE) tablet 10 mg  10 mg Oral Q breakfast Bobbitt, Sed[place] Muta, MD        BP 115/80 (BP Location: Right Arm, Patient Position: Sitting, Cuff Size: Normal)   Pulse 100   Temp 98.9 F (37.2 C) (Oral)   Wt 139 lb (63 kg)   SpO2 95%   BMI 22.44 kg/m chart    Objective:   Physical Exam  Constitutional: She is oriented to person, place, and time. She appears well-developed and  well-nourished.  Neck: Normal range of motion. Neck supple.  Cardiovascular: Normal rate, regular rhythm and normal heart sounds.  Pulmonary/Chest: Effort normal and breath sounds normal.  Musculoskeletal: Normal  range of motion.  Neurological: She is alert and oriented to person, place, and time.  Skin: Rash noted. There is erythema.  Erythematous, vesicular, rash to to the lower stomach extending around to the lower back. Tender to palpation   Psychiatric: She has a normal mood and affect.       Assessment:     Lari was seen today for rash.  Diagnoses and all orders for this visit:  Herpes zoster without complication  Other orders -     valACYclovir (VALTREX) 1000 MG tablet; Take 1 tablet (1,000 mg total) by mouth 3 (three) times daily.      Plan:     Call the office with and questions or concerns. RTC if symptoms worsen or persist.

## 2018-02-25 NOTE — Patient Instructions (Signed)

## 2018-03-01 ENCOUNTER — Ambulatory Visit
Admission: RE | Admit: 2018-03-01 | Discharge: 2018-03-01 | Disposition: A | Discharge status: Home / Self Care | Payer: BLUE CROSS/BLUE SHIELD | Source: Ambulatory Visit | Attending: Medical | Admitting: Medical

## 2018-03-01 ENCOUNTER — Other Ambulatory Visit: Payer: Self-pay | Admitting: Medical

## 2018-03-01 DIAGNOSIS — N6320 Unspecified lump in the left breast, unspecified quadrant: Secondary | ICD-10-CM

## 2018-03-01 NOTE — Progress Notes (Signed)
These preliminary result these preliminary results were noted.  Awaiting final report.

## 2018-03-03 ENCOUNTER — Other Ambulatory Visit: Payer: BLUE CROSS/BLUE SHIELD

## 2018-04-18 NOTE — Telephone Encounter (Signed)
PHONECALL  

## 2018-04-29 ENCOUNTER — Telehealth: Payer: Self-pay

## 2018-04-29 NOTE — Telephone Encounter (Signed)
Patient has MyChart.

## 2018-04-29 NOTE — Telephone Encounter (Signed)
Per patient request to move her current appointment out by 6 months. mon the advice of her PCP. Per 1/31 return voice calls

## 2018-05-05 ENCOUNTER — Other Ambulatory Visit: Payer: BLUE CROSS/BLUE SHIELD

## 2018-05-11 ENCOUNTER — Other Ambulatory Visit: Payer: Self-pay | Admitting: Hematology and Oncology

## 2018-05-11 DIAGNOSIS — Z853 Personal history of malignant neoplasm of breast: Secondary | ICD-10-CM

## 2018-05-20 ENCOUNTER — Other Ambulatory Visit: Payer: Self-pay | Admitting: *Deleted

## 2018-05-20 DIAGNOSIS — J4540 Moderate persistent asthma, uncomplicated: Secondary | ICD-10-CM

## 2018-05-20 DIAGNOSIS — J3089 Other allergic rhinitis: Secondary | ICD-10-CM

## 2018-05-20 MED ORDER — MONTELUKAST SODIUM 10 MG PO TABS: 10.0000 mg | ORAL_TABLET | Freq: Every day | ORAL | 1 refills | Status: DC

## 2018-06-02 ENCOUNTER — Other Ambulatory Visit: Payer: Self-pay

## 2018-06-07 ENCOUNTER — Ambulatory Visit (INDEPENDENT_AMBULATORY_CARE_PROVIDER_SITE_OTHER): Payer: 59 | Admitting: Allergy and Immunology

## 2018-06-07 ENCOUNTER — Encounter: Payer: Self-pay | Admitting: Allergy and Immunology

## 2018-06-07 VITALS — BP 126/90 | HR 78 | Resp 16 | Ht 65.0 in | Wt 139.8 lb

## 2018-06-07 DIAGNOSIS — J3089 Other allergic rhinitis: Secondary | ICD-10-CM

## 2018-06-07 DIAGNOSIS — J4540 Moderate persistent asthma, uncomplicated: Secondary | ICD-10-CM | POA: Diagnosis not present

## 2018-06-07 DIAGNOSIS — H9311 Tinnitus, right ear: Secondary | ICD-10-CM | POA: Diagnosis not present

## 2018-06-07 NOTE — Progress Notes (Signed)
Follow-up Note  RE: Courtney Gomez MRN: [id number] DOB: [birth year] Date of Office Visit: 06/07/2018  Primary care provider: Vania Rea, MD Referring provider: Vania Rea, MD  History of present illness: Courtney Gomez is a 53 y.o. female with moderate persistent asthma, allergic rhinitis, and history of pruritus presenting today for follow-up.  She was last seen in this clinic in October 2019.  She currently takes Flovent 110 g, 2 elations via spacer device twice daily, and montelukast 10 mg daily at bedtime.  While on these medications she rarely requires albuterol rescue, however does note mild wheezing on occasion at nighttime.  She is uncertain of the wheezing is coming from her throat or chest.  She expresses concern about the possibility of developing COPD or emphysema despite the fact that she has never smoked.  Upon questioning she notes that her paternal grandmother had COPD/emphysema despite the fact that she was a lifelong non-smoker.  She complains of recurrent tinnitus.  She describes this as a "buzzing, tunnel sound" in her left ear.  She has not tried a low-sodium diet as recommended by her otolaryngologist.  She has not noticed improvement of the tinnitus while using Xhance.  She experiences nasal congestion, left greater than right but does not complain of sinus pressure.  Assessment and plan: Moderate persistent asthma  A laboratory order form has been provided for alpha-1 antitrypsin level and phenotype.  When lab results have returned the patient will be called with further recommendations and follow up instructions.  For now, continue Flovent 110 g, 2 inhalations via spacer device twice daily, montelukast 10 mg daily at bedtime, and albuterol HFA, 1 to 2 inhalations every 4-6 hours if needed.  During respiratory tract infections or asthma flares, increase Flovent 110g to 3 inhalations 3 times per day until symptoms have returned to baseline.  Subjective and  objective measures of pulmonary function will be followed and the treatment plan will be adjusted accordingly.  Allergic rhinitis  Continue appropriate allergen avoidance measures and montelukast 10 mg daily.  For now, continue Xhance, 2 actuations per nostril twice daily.  A prescription has been provided for azelastine nasal spray, 1-2 sprays per nostril 2 times daily as needed. Proper nasal spray technique has been discussed and demonstrated.   Nasal saline spray (i.e., Simply Saline) or nasal saline lavage (i.e., NeilMed) is recommended as needed and prior to medicated nasal sprays.  Tinnitus, right ear  Trial of low-sodium diet while assessing for symptom change.  Follow-up with otolaryngologist.   Meds ordered this encounter  Medications  . Azelastine HCl 0.15 % SOLN    Sig: Place 1 spray into both nostrils 2 (two) times daily as needed.    Dispense:  30 mL    Refill:  5    Diagnostics: Spirometry reveals an FVC of 3.24 L (90% predicted) and an FEV1 of 2.04 L (72% predicted) without significant postbronchodilator improvement (140 mL reversibility).  Please see scanned spirometry results for details.    Physical examination: Blood pressure 126/90, pulse 78, resp. rate 16, height 5\' 5"  (1.651 m), weight 139 lb 12.8 oz (63.4 kg), SpO2 98 %.  General: Alert, interactive, in no acute distress. HEENT: TMs pearly gray, turbinates moderately edematous with clear discharge, post-pharynx moderately erythematous. Neck: Supple without lymphadenopathy. Lungs: Mildly decreased breath sounds bilaterally without wheezing, rhonchi or rales. CV: Normal S1, S2 without murmurs. Skin: Warm and dry, without lesions or rashes.  The following portions of the patient's history were reviewed and  updated as appropriate: allergies, current medications, past family history, past medical history, past social history, past surgical history and problem list.  Allergies as of 06/07/2018       Reactions   Lanolin Itching   Latex Itching, Swelling      Medication List       Accurate as of June 07, 2018 11:59 PM. Always use your most recent med list.        Azelastine HCl 0.15 % Soln Place 1 spray into both nostrils 2 (two) times daily as needed.   betamethasone (augmented) 0.05 % lotion Commonly known as:  DIPROLENE APPLY TO AFFECTED AREA DAILY   Biotin 1 MG Caps Take by mouth.   cetirizine 10 MG tablet Commonly known as:  ZYRTEC Take 10 mg by mouth daily as needed. Reported on 08/23/2015   fluticasone 110 MCG/ACT inhaler Commonly known as:  Flovent HFA Inhale 2 puffs into the lungs 2 (two) times daily.   fluticasone 50 MCG/ACT nasal spray Commonly known as:  FLONASE PLACE 1 SPRAY INTO BOTH NOSTRILS DAILY.   montelukast 10 MG tablet Commonly known as:  SINGULAIR Take 1 tablet (10 mg total) by mouth at bedtime.   ProAir HFA 108 (90 Base) MCG/ACT inhaler Generic drug:  albuterol Inhale 2 puffs into the lungs every 6 (six) hours as needed for wheezing or shortness of breath. Reported on 08/23/2015       Allergies  Allergen Reactions  . Lanolin Itching  . Latex Itching and Swelling   Review of systems: Review of systems negative except as noted in HPI / PMHx or noted below: Constitutional: Negative.  HENT: Negative.   Eyes: Negative.  Respiratory: Negative.   Cardiovascular: Negative.  Gastrointestinal: Negative.  Genitourinary: Negative.  Musculoskeletal: Negative.  Neurological: Negative.  Endo/Heme/Allergies: Negative.  Cutaneous: Negative.  Past Medical History:  Diagnosis Date  . Anxiety    not currently  . Asthma   . Breast cancer ([place])   . Breast cancer of upper-outer quadrant of right female breast ([place]) 07/15/2015  . Eczema   . History of motion sickness   . Personal history of chemotherapy   . Personal history of radiation therapy   . PONV (postoperative nausea and vomiting)   . Tinnitus   . Vertigo     Family History    Problem Relation Age of Onset  . Colon polyps Mother        hx of one large colon polyp s/p resection  . Other Mother        hx of benign teratoma and molar pregnancy  . Prostate cancer Father 11  . Colon polyps Father        possible colon polyp hx; +diverticulitis  . Diverticulitis Father   . Other Maternal Uncle        hx of unspecified "lump on his back"  . Lupus Paternal Aunt   . Heart attack Paternal Uncle        d. 61s; smoker  . Heart Problems Paternal Uncle   . Colon cancer Maternal Grandmother 63       possible colon cancer  . Lung cancer Maternal Grandfather 62       smoker; +EtOH  . COPD Paternal Grandmother   . Heart Problems Paternal Grandmother   . Heart Problems Paternal Grandfather   . Prostate cancer Paternal Grandfather        possible prostate cancer dx. early 59s  . Stomach cancer Other  maternal great grandmother (MGF's mother); dx. late 63s  . Colon cancer Other 53       paternal great grandmother (PGM's mother)  . Stomach cancer Other        paternal great uncles (PGF's brother) d. late 5s-early 60s    Social History   Socioeconomic History  . Marital status: Legally Separated    Spouse name: Not on file  . Number of children: 2  . Years of education: Not on file  . Highest education level: Not on file  Occupational History  . Not on file  Social Needs  . Financial resource strain: Not on file  . Food insecurity:    Worry: Not on file    Inability: Not on file  . Transportation needs:    Medical: Not on file    Non-medical: Not on file  Tobacco Use  . Smoking status: Never Smoker  . Smokeless tobacco: Never Used  Substance and Sexual Activity  . Alcohol use: Yes    Alcohol/week: 0.0 standard drinks    Comment: ocassional  . Drug use: No  . Sexual activity: Not Currently    Comment: Was on BCP stopped 07/13/15  Lifestyle  . Physical activity:    Days per week: Not on file    Minutes per session: Not on file  . Stress: Not  on file  Relationships  . Social connections:    Talks on phone: Not on file    Gets together: Not on file    Attends religious service: Not on file    Active member of club or organization: Not on file    Attends meetings of clubs or organizations: Not on file    Relationship status: Not on file  . Intimate partner violence:    Fear of current or ex partner: Not on file    Emotionally abused: Not on file    Physically abused: Not on file    Forced sexual activity: Not on file  Other Topics Concern  . Not on file  Social History Narrative  . Not on file    I appreciate the opportunity to take part in [facility] care. Please do not hesitate to contact me with questions.  Sincerely,   R. Edgar Frisk, MD

## 2018-06-07 NOTE — Patient Instructions (Addendum)
Moderate persistent asthma  A laboratory order form has been provided for alpha-1 antitrypsin level and phenotype.  When lab results have returned the patient will be called with further recommendations and follow up instructions.  For now, continue Flovent 110 g, 2 inhalations via spacer device twice daily, montelukast 10 mg daily at bedtime, and albuterol HFA, 1 to 2 inhalations every 4-6 hours if needed.  During respiratory tract infections or asthma flares, increase Flovent 110g to 3 inhalations 3 times per day until symptoms have returned to baseline.  Subjective and objective measures of pulmonary function will be followed and the treatment plan will be adjusted accordingly.  Allergic rhinitis  Continue appropriate allergen avoidance measures and montelukast 10 mg daily.  For now, continue Xhance, 2 actuations per nostril twice daily.  A prescription has been provided for azelastine nasal spray, 1-2 sprays per nostril 2 times daily as needed. Proper nasal spray technique has been discussed and demonstrated.   Nasal saline spray (i.e., Simply Saline) or nasal saline lavage (i.e., NeilMed) is recommended as needed and prior to medicated nasal sprays.  Tinnitus, right ear  Trial of low-sodium diet while assessing for symptom change.  Follow-up with otolaryngologist.   Return in about 4 months (around 10/07/2018), or if symptoms worsen or fail to improve.

## 2018-06-07 NOTE — Assessment & Plan Note (Signed)
   Continue appropriate allergen avoidance measures and montelukast 10 mg daily.  For now, continue Xhance, 2 actuations per nostril twice daily.  A prescription has been provided for azelastine nasal spray, 1-2 sprays per nostril 2 times daily as needed. Proper nasal spray technique has been discussed and demonstrated.   Nasal saline spray (i.e., Simply Saline) or nasal saline lavage (i.e., NeilMed) is recommended as needed and prior to medicated nasal sprays.

## 2018-06-07 NOTE — Assessment & Plan Note (Signed)
   A laboratory order form has been provided for alpha-1 antitrypsin level and phenotype.  When lab results have returned the patient will be called with further recommendations and follow up instructions.  For now, continue Flovent 110 g, 2 inhalations via spacer device twice daily, montelukast 10 mg daily at bedtime, and albuterol HFA, 1 to 2 inhalations every 4-6 hours if needed.  During respiratory tract infections or asthma flares, increase Flovent 110g to 3 inhalations 3 times per day until symptoms have returned to baseline.  Subjective and objective measures of pulmonary function will be followed and the treatment plan will be adjusted accordingly.

## 2018-06-07 NOTE — Assessment & Plan Note (Signed)
   Trial of low-sodium diet while assessing for symptom change.  Follow-up with otolaryngologist.

## 2018-06-08 ENCOUNTER — Encounter: Payer: Self-pay | Admitting: Allergy and Immunology

## 2018-06-08 MED ORDER — AZELASTINE HCL 0.15 % NA SOLN: 1.0000 | Freq: Two times a day (BID) | NASAL | 5 refills | Status: DC | PRN

## 2018-06-17 LAB — ALPHA-1-ANTITRYPSIN DEFICIENCY

## 2018-06-17 LAB — ALPHA-1 ANTITRYPSIN PHENOTYPE: A-1 Antitrypsin: 127 mg/dL (ref 101–187)

## 2018-07-06 ENCOUNTER — Other Ambulatory Visit: Payer: Self-pay | Admitting: Allergy and Immunology

## 2018-07-06 DIAGNOSIS — J4540 Moderate persistent asthma, uncomplicated: Secondary | ICD-10-CM

## 2018-07-06 DIAGNOSIS — J3089 Other allergic rhinitis: Secondary | ICD-10-CM

## 2018-07-11 ENCOUNTER — Other Ambulatory Visit: Payer: Self-pay

## 2018-07-11 ENCOUNTER — Ambulatory Visit
Admission: RE | Admit: 2018-07-11 | Discharge: 2018-07-11 | Disposition: A | Discharge status: Home / Self Care | Payer: BLUE CROSS/BLUE SHIELD | Source: Ambulatory Visit | Attending: Hematology and Oncology | Admitting: Hematology and Oncology

## 2018-07-11 DIAGNOSIS — Z853 Personal history of malignant neoplasm of breast: Secondary | ICD-10-CM

## 2018-07-13 ENCOUNTER — Ambulatory Visit: Payer: BLUE CROSS/BLUE SHIELD | Admitting: Hematology and Oncology

## 2018-07-27 ENCOUNTER — Telehealth: Payer: Self-pay

## 2018-07-27 NOTE — Telephone Encounter (Addendum)
Pt reached out to [place] via my-chart to let us know she was concerned with the amount she has to pay for Flovent. The Flovent is a tier 2 which is the lowest.  Dr. Verlin Fester would like [place] to submit an application to [employer] for Pulmicort 180 mcg to see if pt could get free drug.  Application was scanned to pt's e-mail ([email or web address] ) by JC for her to sign and send back.

## 2018-08-10 ENCOUNTER — Other Ambulatory Visit: Payer: Self-pay | Admitting: *Deleted

## 2018-08-10 MED ORDER — FLUTICASONE-SALMETEROL 100-50 MCG/DOSE IN AEPB: 1.0000 | INHALATION_SPRAY | Freq: Two times a day (BID) | RESPIRATORY_TRACT | 5 refills | Status: DC

## 2018-09-29 ENCOUNTER — Other Ambulatory Visit: Payer: Self-pay | Admitting: Allergy and Immunology

## 2018-09-29 DIAGNOSIS — J3089 Other allergic rhinitis: Secondary | ICD-10-CM

## 2018-09-29 DIAGNOSIS — J4540 Moderate persistent asthma, uncomplicated: Secondary | ICD-10-CM

## 2018-10-27 NOTE — Progress Notes (Signed)
Patient Care Team: Vania Rea, MD as PCP - General (Obstetrics and Gynecology) Nicholas Lose, MD as Consulting Physician (Hematology and Oncology) Rolm Bookbinder, MD as Consulting Physician (General Surgery) Delice Bison, Charlestine Massed, NP as Nurse Practitioner (Hematology and Oncology) Tyler Pita, MD as Consulting Physician (Radiation Oncology)  DIAGNOSIS:    ICD-10-CM   1. Malignant neoplasm of upper-outer quadrant of right breast in female, estrogen receptor positive ([place])  C50.411    Z17.0     SUMMARY OF ONCOLOGIC HISTORY: Oncology History  Breast cancer of upper-outer quadrant of right female breast ([place])  07/11/2015 Mammogram   Right breast mass in the axillary tail 1.7 x 1.5 x 1.3 cm axillary ultrasound negative, T1c N0 stage IA clinical stage   07/11/2015 Initial Diagnosis   Right breast biopsy 10:00 position: Invasive ductal carcinoma grade 3, ER 0%, P of 0%, Ki-67 95%, HER-2 negative ratio 1.21; right breast biopsy 12:00: Fibrocystic changes   08/02/2015 - 12/20/2015 Neo-Adjuvant Chemotherapy   Dose dense Adriamycin and Cytoxan 4 followed by Taxol, switched to Abraxane with cycle 2, completed 12 weekly cycles   08/15/2015 Genetic Testing   Genetic testing was normal, and did not reveal a deleterious mutation.  Additionally, no variants of uncertain significance (VUSes) were found.  Genes tested include: ATM, BARD1, BRCA1, BRCA2, BRIP1, CDH1, CHEK2, FANCC, MLH1, MSH2, MSH6, NBN, PALB2, PMS2, PTEN, RAD51C, RAD51D, TP53, and XRCC2.  This panel also includes deletion/duplication analysis (without sequencing) for one gene, EPCAM.    12/20/2015 Breast MRI   Radiologic complete response. 2 foci of enhancement 12:00 and 11:30 position previously biopsy-proven benign   01/08/2016 Surgery   Right lumpectomy: Focal ALH, fibrocystic change, no residual cancer, 0/7 lymph nodes negative, triple negative disease, complete pathologic response   01/30/2016 - 03/17/2016 Radiation  Therapy   Adjuvant radiation therapy ([place]): 1. The right breast was treated to 50.4 Gy in 28 fractions at 1.8 Gy per fraction. 2. The right breast was boosted to 10 Gy in 5 fractions at 2 Gy per fraction.      CHIEF COMPLIANT: Surveillance of right breast cancer  INTERVAL HISTORY: Courtney Gomez is a 53 y.o. with above-mentioned history of right breast cancer treated with neoadjuvant chemotherapy followed by lumpectomy and radiation. She is currently on surveillance. I last saw her over a year ago. Mammogram on 07/11/18 showed no evidence of malignancy bilaterally. She presents to the clinic today for annual follow-up.   REVIEW OF SYSTEMS:   Constitutional: Denies fevers, chills or abnormal weight loss Eyes: Denies blurriness of vision Ears, nose, mouth, throat, and face: Denies mucositis or sore throat Respiratory: Denies cough, dyspnea or wheezes Cardiovascular: Denies palpitation, chest discomfort Gastrointestinal: Denies nausea, heartburn or change in bowel habits Skin: Denies abnormal skin rashes Lymphatics: Denies new lymphadenopathy or easy bruising Neurological: Denies numbness, tingling or new weaknesses Behavioral/Psych: Mood is stable, no new changes  Extremities: No lower extremity edema Breast: denies any pain or lumps or nodules in either breasts All other systems were reviewed with the patient and are negative.  I have reviewed the past medical history, past surgical history, social history and family history with the patient and they are unchanged from previous note.  ALLERGIES:  is allergic to lanolin and latex.  MEDICATIONS:  Current Outpatient Medications  Medication Sig Dispense Refill  . albuterol (PROAIR HFA) 108 (90 BASE) MCG/ACT inhaler Inhale 2 puffs into the lungs every 6 (six) hours as needed for wheezing or shortness of breath. Reported on 08/23/2015    .  Azelastine HCl 0.15 % SOLN Place 1 spray into both nostrils 2 (two) times daily as needed. 30 mL 5   . betamethasone, augmented, (DIPROLENE) 0.05 % lotion APPLY TO AFFECTED AREA DAILY  6  . Biotin 1 MG CAPS Take by mouth.    . cetirizine (ZYRTEC) 10 MG tablet Take 10 mg by mouth daily as needed. Reported on 08/23/2015    . fluticasone (FLONASE) 50 MCG/ACT nasal spray PLACE 1 SPRAY INTO BOTH NOSTRILS DAILY. 16 g 2  . fluticasone (FLOVENT HFA) 110 MCG/ACT inhaler Inhale 2 puffs into the lungs 2 (two) times daily. 1 Inhaler 5  . Fluticasone-Salmeterol (ADVAIR) 100-50 MCG/DOSE AEPB Inhale 1 puff into the lungs 2 (two) times a day. 60 each 5  . montelukast (SINGULAIR) 10 MG tablet TAKE 1 TABLET BY MOUTH EVERYDAY AT BEDTIME 90 tablet 0   No current facility-administered medications for this visit.     PHYSICAL EXAMINATION: ECOG PERFORMANCE STATUS: 0 - Asymptomatic  Vitals:   10/28/18 1143  BP: (!) 153/93  Pulse: 78  Resp: 17  Temp: 98 F (36.7 C)  SpO2: 100%   Filed Weights   10/28/18 1143  Weight: 138 lb (62.6 kg)    GENERAL: alert, no distress and comfortable SKIN: skin color, texture, turgor are normal, no rashes or significant lesions EYES: normal, Conjunctiva are pink and non-injected, sclera clear OROPHARYNX: no exudate, no erythema and lips, buccal mucosa, and tongue normal  NECK: supple, thyroid normal size, non-tender, without nodularity LYMPH: no palpable lymphadenopathy in the cervical, axillary or inguinal LUNGS: clear to auscultation and percussion with normal breathing effort HEART: regular rate & rhythm and no murmurs and no lower extremity edema ABDOMEN: abdomen soft, non-tender and normal bowel sounds MUSCULOSKELETAL: no cyanosis of digits and no clubbing  NEURO: alert & oriented x 3 with fluent speech, no focal motor/sensory deficits EXTREMITIES: No lower extremity edema BREAST: No palpable masses or nodules in either right or left breasts. No palpable axillary supraclavicular or infraclavicular adenopathy no breast tenderness or nipple discharge. (exam performed  in the presence of a chaperone)  LABORATORY DATA:  I have reviewed the data as listed CMP Latest Ref Rng & Units 08/27/2017 03/16/2016 12/20/2015  Glucose 65 - 99 mg/dL 98 101(H) 104  BUN 6 - 24 mg/dL 9 6 10.7  Creatinine 0.57 - 1.00 mg/dL 0.76 0.67 0.8  Sodium 134 - 144 mmol/L 143 142 144  Potassium 3.5 - 5.2 mmol/L 4.5 3.8 4.2  Chloride 96 - 106 mmol/L 104 107 -  CO2 20 - 29 mmol/L 27 28 28   Calcium 8.7 - 10.2 mg/dL 9.8 9.5 9.6  Total Protein 6.0 - 8.5 g/dL 7.2 - 6.6  Total Bilirubin 0.0 - 1.2 mg/dL 0.3 - 0.42  Alkaline Phos 39 - 117 IU/L 93 - 80  AST 0 - 40 IU/L 16 - 19  ALT 0 - 32 IU/L 16 - 29    Lab Results  Component Value Date   WBC 5.3 08/27/2017   HGB 13.4 08/27/2017   HCT 38.9 08/27/2017   MCV 94 08/27/2017   PLT 226 08/27/2017   NEUTROABS 2.5 08/27/2017    ASSESSMENT & PLAN:  Breast cancer of upper-outer quadrant of right female breast ([place]) Right mammogram and ultrasound 07/28/2015 Right breast mass in the axillary tail 1.7 x 1.5 x 1.3 cm axillary ultrasound negative, T1c N0 stage IA clinical stage Right breast biopsy 07/11/2015: 10:00 position: Invasive ductal carcinoma grade 3, ER 0%, PR 0%, Ki-67 95%, HER-2 negative  ratio 1.21  Right lumpectomy 01/08/2016: Right lumpectomy: Focal ALH, fibrocystic change, no residual cancer, 0/7 lymph nodes negative, triple negative disease, complete pathologic response  TreatmentSummary: 1. Neoadj chemotherapy with Adriamycin and Cytoxan dose dense 4 foll by Taxol x 1 then Abraxaneweekly total 12 weeks from 08/02/2015 to 12/20/2015 2. Followed by breast conserving surgery with sentinel lymph node study10/01/2016 3. Followed by adjuvant radiation therapy11/05/2015-03/17/2016 ------------------------------------------------------------------------------------------------------------------------------ Treatment plan: Surveillance Breast exam 10/28/2018: No palpable lumps or nodules  Mammogram and U/S  07/11/2018: No  mammographic evidence of malignancy in either breast  Return to clinic in1 yearfor surveillance and follow-up     No orders of the defined types were placed in this encounter.  The patient has a good understanding of the overall plan. she agrees with it. she will call with any problems that may develop before the next visit here.  Nicholas Lose, MD 10/28/2018  Julious Oka Dorshimer am acting as scribe for Dr. Nicholas Lose.  I have reviewed the above documentation for accuracy and completeness, and I agree with the above.

## 2018-10-28 ENCOUNTER — Other Ambulatory Visit: Payer: Self-pay

## 2018-10-28 ENCOUNTER — Inpatient Hospital Stay: Payer: Managed Care, Other (non HMO) | Attending: Hematology and Oncology | Admitting: Hematology and Oncology

## 2018-10-28 DIAGNOSIS — C50411 Malignant neoplasm of upper-outer quadrant of right female breast: Secondary | ICD-10-CM

## 2018-10-28 DIAGNOSIS — Z923 Personal history of irradiation: Secondary | ICD-10-CM | POA: Insufficient documentation

## 2018-10-28 DIAGNOSIS — Z170 Estrogen receptor positive status [ER+]: Secondary | ICD-10-CM

## 2018-10-28 DIAGNOSIS — Z9221 Personal history of antineoplastic chemotherapy: Secondary | ICD-10-CM | POA: Diagnosis not present

## 2018-10-28 DIAGNOSIS — Z853 Personal history of malignant neoplasm of breast: Secondary | ICD-10-CM | POA: Insufficient documentation

## 2018-10-28 NOTE — Assessment & Plan Note (Signed)
Right mammogram and ultrasound 07/28/2015 Right breast mass in the axillary tail 1.7 x 1.5 x 1.3 cm axillary ultrasound negative, T1c N0 stage IA clinical stage Right breast biopsy 07/11/2015: 10:00 position: Invasive ductal carcinoma grade 3, ER 0%, PR 0%, Ki-67 95%, HER-2 negative ratio 1.21  Right lumpectomy 01/08/2016: Right lumpectomy: Focal ALH, fibrocystic change, no residual cancer, 0/7 lymph nodes negative, triple negative disease, complete pathologic response  TreatmentSummary: 1. Neoadj chemotherapy with Adriamycin and Cytoxan dose dense 4 foll by Taxol x 1 then Abraxaneweekly total 12 weeks from 08/02/2015 to 12/20/2015 2. Followed by breast conserving surgery with sentinel lymph node study10/01/2016 3. Followed by adjuvant radiation therapy11/05/2015-03/17/2016 ------------------------------------------------------------------------------------------------------------------------------ Treatment plan: Surveillance Breast exam 10/28/2018: No palpable lumps or nodules  Mammogram and U/S  07/11/2018: No mammographic evidence of malignancy in either breast  Return to clinic in1 yearfor surveillance and follow-up

## 2019-01-01 ENCOUNTER — Other Ambulatory Visit: Payer: Self-pay | Admitting: Allergy and Immunology

## 2019-01-01 DIAGNOSIS — J4540 Moderate persistent asthma, uncomplicated: Secondary | ICD-10-CM

## 2019-01-01 DIAGNOSIS — J3089 Other allergic rhinitis: Secondary | ICD-10-CM

## 2019-01-04 ENCOUNTER — Encounter: Payer: Self-pay | Admitting: *Deleted

## 2019-01-26 ENCOUNTER — Other Ambulatory Visit: Payer: Self-pay | Admitting: Allergy and Immunology

## 2019-01-26 DIAGNOSIS — J3089 Other allergic rhinitis: Secondary | ICD-10-CM

## 2019-01-26 DIAGNOSIS — J4540 Moderate persistent asthma, uncomplicated: Secondary | ICD-10-CM

## 2019-02-05 ENCOUNTER — Other Ambulatory Visit: Payer: Self-pay | Admitting: Allergy and Immunology

## 2019-02-25 ENCOUNTER — Other Ambulatory Visit: Payer: Self-pay | Admitting: Allergy and Immunology

## 2019-02-25 DIAGNOSIS — J3089 Other allergic rhinitis: Secondary | ICD-10-CM

## 2019-02-25 DIAGNOSIS — J4540 Moderate persistent asthma, uncomplicated: Secondary | ICD-10-CM

## 2019-03-10 ENCOUNTER — Ambulatory Visit (INDEPENDENT_AMBULATORY_CARE_PROVIDER_SITE_OTHER): Payer: 59 | Admitting: Family Medicine

## 2019-03-10 ENCOUNTER — Encounter: Payer: Self-pay | Admitting: Family Medicine

## 2019-03-10 ENCOUNTER — Other Ambulatory Visit: Payer: Self-pay

## 2019-03-10 DIAGNOSIS — H9311 Tinnitus, right ear: Secondary | ICD-10-CM | POA: Diagnosis not present

## 2019-03-10 DIAGNOSIS — J3089 Other allergic rhinitis: Secondary | ICD-10-CM

## 2019-03-10 DIAGNOSIS — J4540 Moderate persistent asthma, uncomplicated: Secondary | ICD-10-CM

## 2019-03-10 MED ORDER — MONTELUKAST SODIUM 10 MG PO TABS: 10.0000 mg | ORAL_TABLET | Freq: Every day | ORAL | 5 refills | Status: DC

## 2019-03-10 NOTE — Patient Instructions (Signed)
Asthma Continue Wixela 1 puff twice a day to prevent cough or wheeze Continue montelukast 10 mg once a day to prevent cough or wheeze Continue albuterol 2 puffs every 4 hours as needed for cough or wheeze You may use albuterol 2 puffs 5-15 minutes before exercise to decrease cough or wheeze  Allergic rhinitis Continue Flonase 1-2 sprays in each ear once a day as needed for a stuffy nose Consider saline nasal rinses as needed for nasal symptoms. Use this before any medicated nasal sprays for best result  Tinnitus Continue to follow up with your otolaryngologist   Call the clinic if this treatment plan is not working well for you  Follow up in 6 months or sooner if needed

## 2019-03-10 NOTE — Progress Notes (Signed)
RE: Courtney Gomez MRN: [id number] DOB: 10-09-1965 Date of Telemedicine Visit: 03/10/2019  Referring provider: Vania Rea, MD Primary care provider: Vania Rea, MD  Chief Complaint: Asthma and Allergic Rhinitis    Telemedicine Follow Up Visit via Telephone: I connected with Courtney Gomez for a follow up on 03/10/19 by telephone and verified that I am speaking with the correct person using two identifiers.   I discussed the limitations, risks, security and privacy concerns of performing an evaluation and management service by telephone and the availability of in person appointments. I also discussed with the patient that there may be a patient responsible charge related to this service. The patient expressed understanding and agreed to proceed.  Patient is at home Provider is at the office.  Visit start time: 9:03 Visit end time: 9:30 Insurance consent/check in by: Courtney Gomez Medical consent and medical assistant/nurse: Davy Pique  History of Present Illness: She is a 53 y.o. female, who is being followed for asthma, allergic rhinitis, and tinnitis. Her previous allergy office visit was on 06/07/2018 with Dr. Verlin Fester. At today's visit, she reports that her asthma has been well controlled with no shortness of breath, cough, or wheeze with activity or rest. She continues montelukast 10 mg once a day, wixella 100-1 puff twice a day, and uses her albuterol inhaler infrequently. Allergic rhinitis is reported as well controlled Flonase as needed. She reports her tinnitus is 95% improved since her last visit to this clinic. Her current medications are listed in the chart.    Assessment and Plan: Chapin is a 53 y.o. female with: Patient Instructions  Asthma Continue Wixela 1 puff twice a day to prevent cough or wheeze Continue montelukast 10 mg once a day to prevent cough or wheeze Continue albuterol 2 puffs every 4 hours as needed for cough or wheeze You may use albuterol 2  puffs 5-15 minutes before exercise to decrease cough or wheeze  Allergic rhinitis Continue Flonase 1-2 sprays in each ear once a day as needed for a stuffy nose Consider saline nasal rinses as needed for nasal symptoms. Use this before any medicated nasal sprays for best result  Tinnitus Continue to follow up with your otolaryngologist   Call the clinic if this treatment plan is not working well for you  Follow up in 6 months or sooner if needed    Return in about 6 months (around 09/08/2019), or if symptoms worsen or fail to improve.  Meds ordered this encounter  Medications  . montelukast (SINGULAIR) 10 MG tablet    Sig: Take 1 tablet (10 mg total) by mouth at bedtime.    Dispense:  30 tablet    Refill:  5    Patient must have an office visit before any further refills.     Medication List:  Current Outpatient Medications  Medication Sig Dispense Refill  . albuterol (PROAIR HFA) 108 (90 BASE) MCG/ACT inhaler Inhale 2 puffs into the lungs every 6 (six) hours as needed for wheezing or shortness of breath. Reported on 08/23/2015    . betamethasone, augmented, (DIPROLENE) 0.05 % lotion APPLY TO AFFECTED AREA DAILY  6  . Biotin 1 MG CAPS Take by mouth.    . fluticasone (FLONASE) 50 MCG/ACT nasal spray PLACE 1 SPRAY INTO BOTH NOSTRILS DAILY. 16 g 2  . WIXELA INHUB 100-50 MCG/DOSE AEPB TAKE 1 PUFF BY MOUTH TWICE A DAY 180 each 1  . montelukast (SINGULAIR) 10 MG tablet Take 1 tablet (10 mg total) by mouth  at bedtime. 30 tablet 5   No current facility-administered medications for this visit.   Allergies: Allergies  Allergen Reactions  . Lanolin Itching  . Latex Itching and Swelling   I reviewed her past medical history, social history, family history, and environmental history and no significant changes have been reported from previous visit on 3102020.  Objective: Physical Exam Not obtained as encounter was done via telephone.   Previous notes and tests were reviewed.  I  discussed the assessment and treatment plan with the patient. The patient was provided an opportunity to ask questions and all were answered. The patient agreed with the plan and demonstrated an understanding of the instructions.   The patient was advised to call back or seek an in-person evaluation if the symptoms worsen or if the condition fails to improve as anticipated.  I provided 27 minutes of non-face-to-face time during this encounter.  It was my pleasure to participate in [place] care today. Please feel free to contact me with any questions or concerns.   Sincerely,  Gareth Morgan, FNP

## 2019-05-30 ENCOUNTER — Other Ambulatory Visit: Payer: Self-pay | Admitting: *Deleted

## 2019-05-30 ENCOUNTER — Encounter: Payer: Self-pay | Admitting: Hematology and Oncology

## 2019-05-30 DIAGNOSIS — Z170 Estrogen receptor positive status [ER+]: Secondary | ICD-10-CM

## 2019-05-30 DIAGNOSIS — C50411 Malignant neoplasm of upper-outer quadrant of right female breast: Secondary | ICD-10-CM

## 2019-07-29 DEATH — deceased

## 2019-09-04 IMAGING — MG DIGITAL DIAGNOSTIC BILATERAL MAMMOGRAM WITH TOMO AND CAD
6 of 9 series · 6 of 25 positions shown · non-contrast
Comparison: Previous exam(s).

CLINICAL DATA: 51-year-old patient presents for annual examination.
History of right breast cancer diagnosed in 6099. She has completed
lumpectomy and radiation therapy. She is asymptomatic.

EXAM:
DIGITAL DIAGNOSTIC BILATERAL MAMMOGRAM WITH CAD AND TOMO
ULTRASOUND LEFT AXILLA

[R MLO]
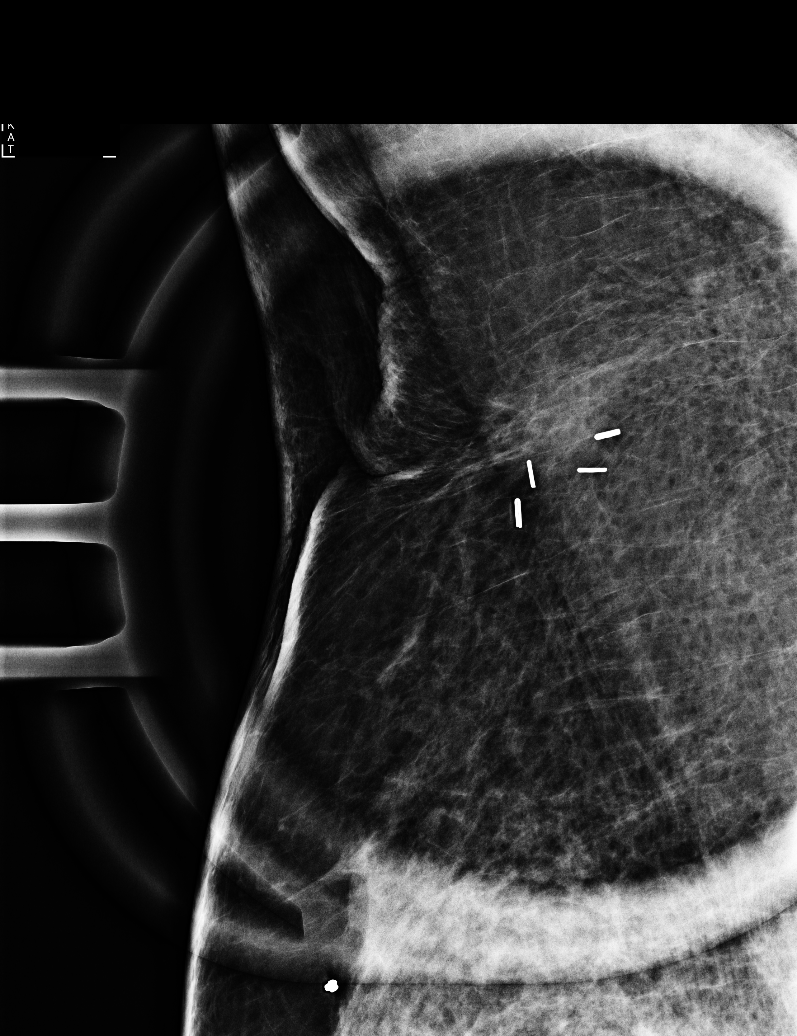

[L CC synth-2D]
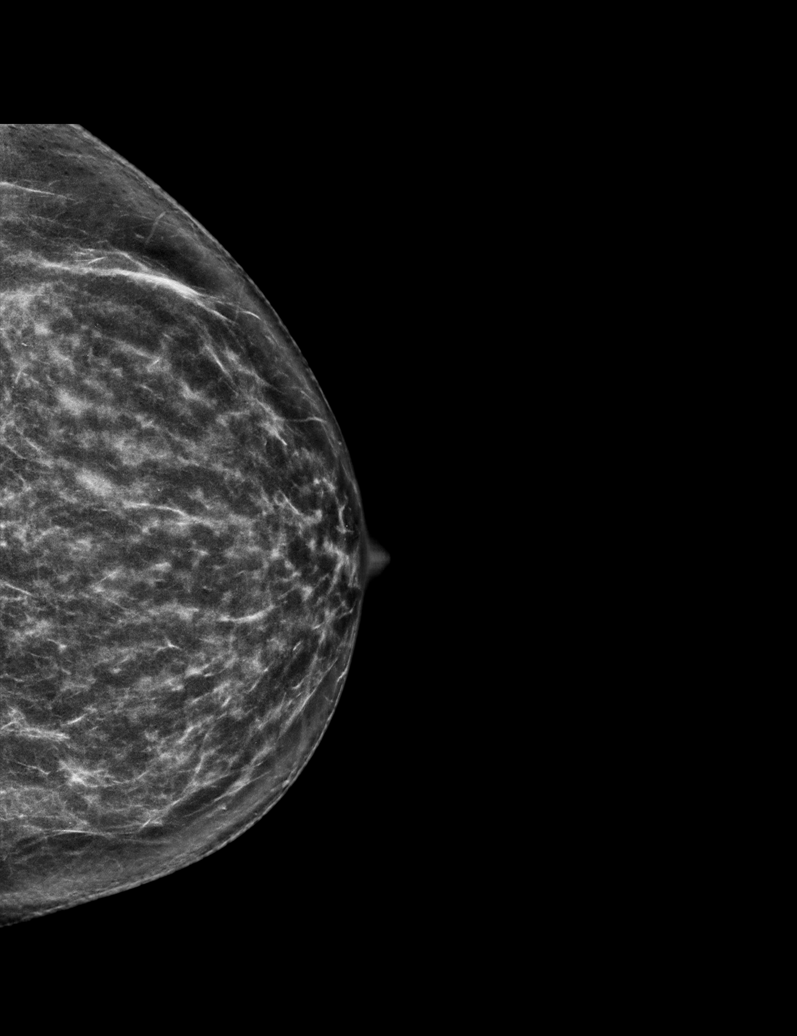

[R CC synth-2D]
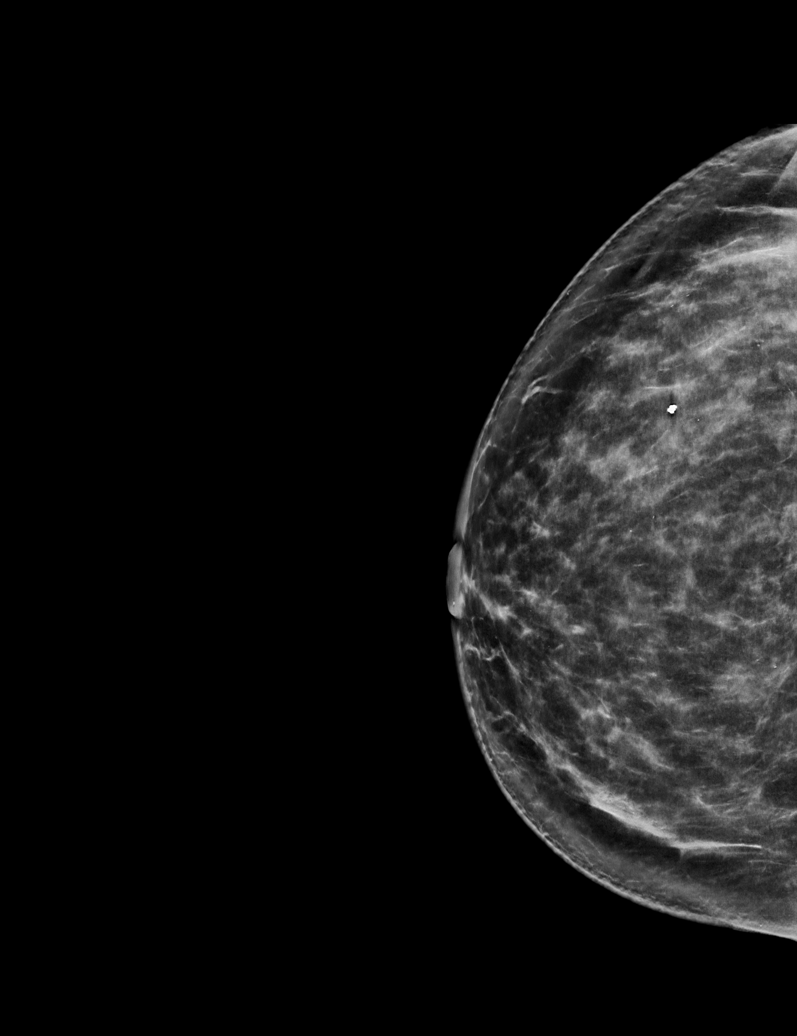

[L MLO synth-2D]
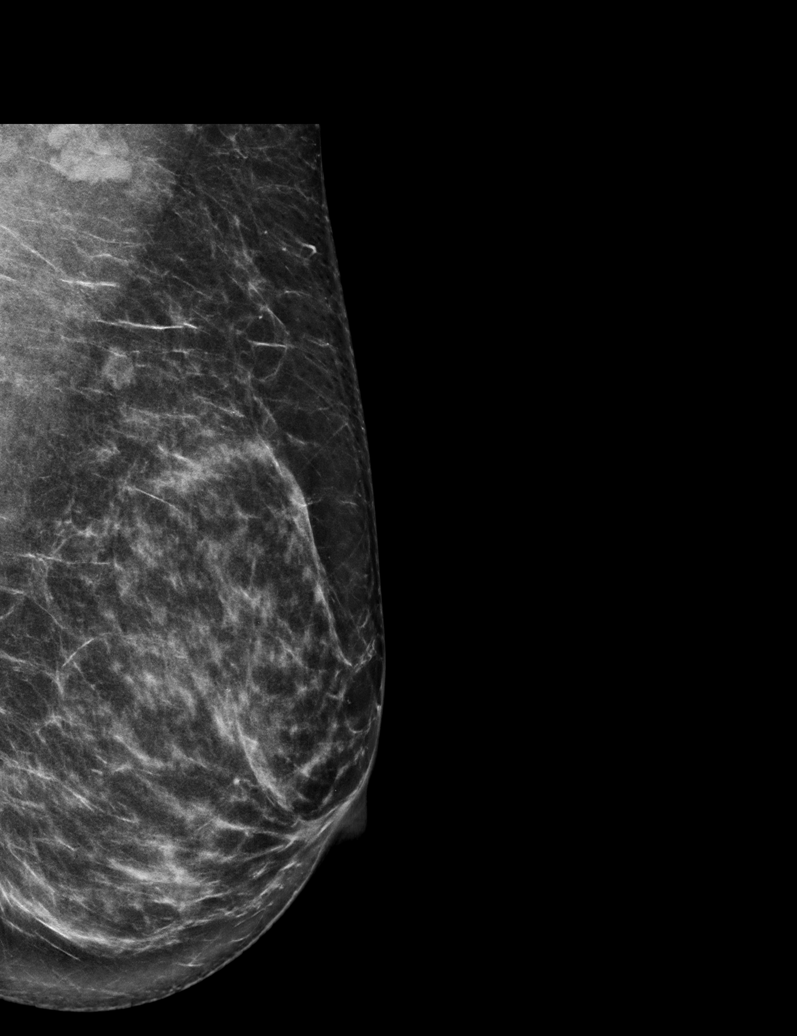

[R MLO synth-2D]
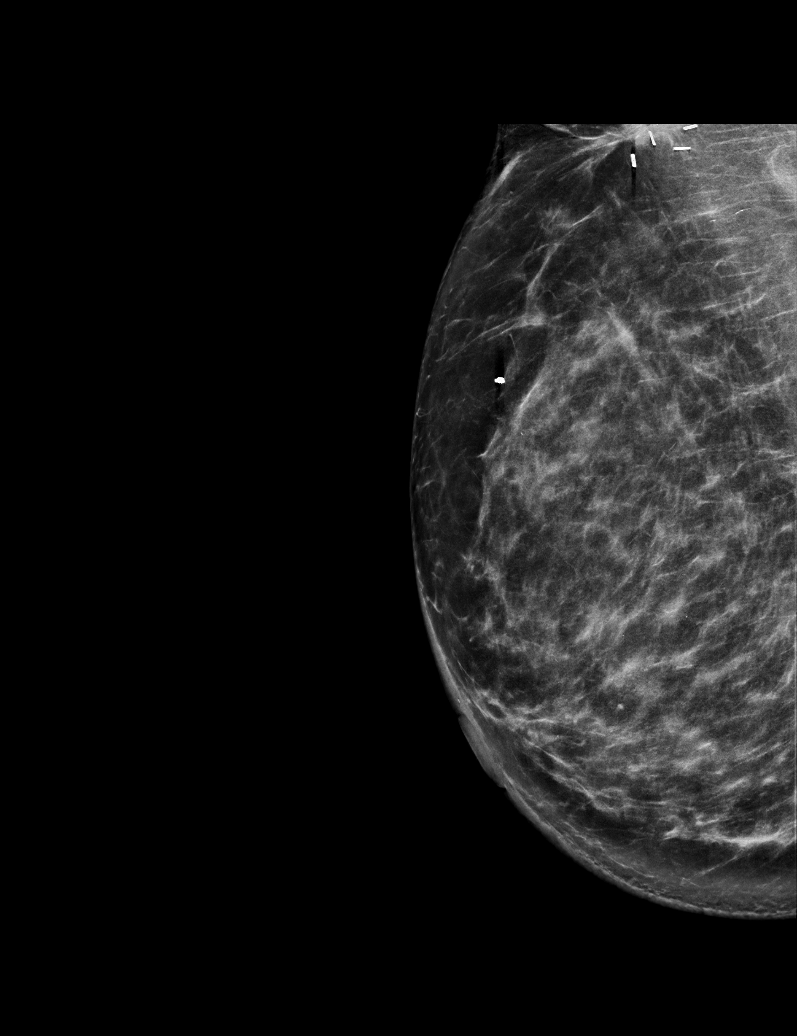

[L CC tomo · tomo slice 34/67.0]
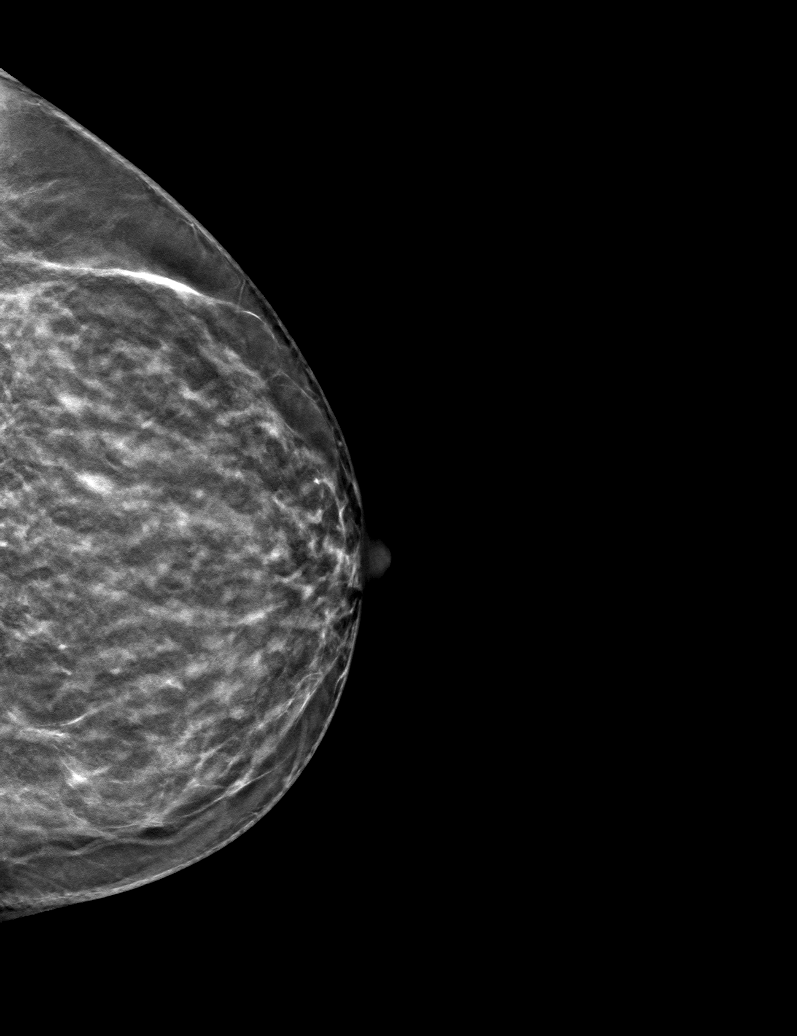

[6 of 25 positions shown; findings below may reference images not displayed]

ACR Breast Density Category c: The breast tissue is heterogeneously
dense, which may obscure small masses.
FINDINGS: There are lumpectomy changes with 4 surgical clips in the inferior
right axilla at the site of prior lumpectomy. Remainder of the right
breast is negative. No mass, suspicious microcalcification or
nonsurgical distortion is identified to suggest malignancy.

More posterior tissue is included in the left axilla on today's
mammogram compared to priors. There are probably multiple normal
lymph nodes adjacent to each other rather than an enlarged lymph
node or nodes, but this will be confirmed with ultrasound. No mass,
architectural distortion, or suspicious microcalcification is
identified in the left breast to suggest malignancy.

Mammographic images were processed with CAD.

Targeted ultrasound is performed, showing multiple normal left
axillary lymph nodes with thin cortices and normal fatty hila. No
lymphadenopathy or cortical thickening is present in the left
axillary lymph nodes.
IMPRESSION: No evidence of malignancy in either breast. Normal appearance of
left axillary lymph nodes, which were not included in the imaging
field on prior mammograms.

RECOMMENDATION:
Diagnostic mammogram is suggested in 1 year. (Code:[id number]-B-0WI)

I have discussed the findings and recommendations with the patient.
Results were also provided in writing at the conclusion of the
visit. If applicable, a reminder letter will be sent to the patient
regarding the next appointment.

BI-RADS CATEGORY  2: Benign.

## 2019-09-06 ENCOUNTER — Other Ambulatory Visit: Payer: Self-pay | Admitting: Family Medicine

## 2019-09-07 NOTE — Progress Notes (Deleted)
° °  [address] [place] [address] Dept: [phone]  FOLLOW UP NOTE  Patient ID: Courtney Gomez, female    DOB: 16-Jul-1965  Age: 54 y.o. MRN: [id number] Date of Office Visit: 09/08/2019  Assessment  Chief Complaint: No chief complaint on file.  HPI Courtney Gomez    Drug Allergies:  Allergies  Allergen Reactions   Lanolin Itching   Latex Itching and Swelling    Physical Exam: There were no vitals taken for this visit.   Physical Exam  Diagnostics:    Assessment and Plan: No diagnosis found.  No orders of the defined types were placed in this encounter.   There are no Patient Instructions on file for this visit.  No follow-ups on file.    Thank you for the opportunity to care for this patient.  Please do not hesitate to contact me with questions.  Gareth Morgan, FNP [facility]

## 2019-09-08 ENCOUNTER — Ambulatory Visit: Payer: 59 | Admitting: Family Medicine

## 2019-09-20 ENCOUNTER — Other Ambulatory Visit: Payer: Self-pay | Admitting: Family Medicine

## 2019-10-30 ENCOUNTER — Inpatient Hospital Stay: Payer: Managed Care, Other (non HMO) | Admitting: Hematology and Oncology
